# Patient Record
Sex: Female | Born: 1935 | Race: White | Hispanic: No | Marital: Married | Smoking: Former smoker
Health system: Southern US, Community
[De-identification: ages and names within clinical notes are randomized; demographics above are authoritative.]

## PROBLEM LIST (undated history)

## (undated) DIAGNOSIS — M1990 Unspecified osteoarthritis, unspecified site: Secondary | ICD-10-CM

## (undated) DIAGNOSIS — I10 Essential (primary) hypertension: Secondary | ICD-10-CM

## (undated) DIAGNOSIS — R51 Headache: Secondary | ICD-10-CM

## (undated) DIAGNOSIS — T4145XA Adverse effect of unspecified anesthetic, initial encounter: Secondary | ICD-10-CM

## (undated) DIAGNOSIS — R519 Headache, unspecified: Secondary | ICD-10-CM

## (undated) DIAGNOSIS — C801 Malignant (primary) neoplasm, unspecified: Secondary | ICD-10-CM

## (undated) DIAGNOSIS — I350 Nonrheumatic aortic (valve) stenosis: Secondary | ICD-10-CM

## (undated) DIAGNOSIS — H35039 Hypertensive retinopathy, unspecified eye: Secondary | ICD-10-CM

## (undated) DIAGNOSIS — T8859XA Other complications of anesthesia, initial encounter: Secondary | ICD-10-CM

## (undated) DIAGNOSIS — F32A Depression, unspecified: Secondary | ICD-10-CM

## (undated) DIAGNOSIS — I447 Left bundle-branch block, unspecified: Secondary | ICD-10-CM

## (undated) DIAGNOSIS — I499 Cardiac arrhythmia, unspecified: Secondary | ICD-10-CM

## (undated) HISTORY — DX: Hypertensive retinopathy, unspecified eye: H35.039

## (undated) HISTORY — DX: Essential (primary) hypertension: I10

## (undated) HISTORY — PX: CATARACT EXTRACTION: SUR2

## (undated) HISTORY — PX: APPENDECTOMY: SHX54

## (undated) HISTORY — DX: Nonrheumatic aortic (valve) stenosis: I35.0

## (undated) HISTORY — PX: EYE SURGERY: SHX253

## (undated) HISTORY — PX: UTERINE FIBROID SURGERY: SHX826

## (undated) HISTORY — PX: OTHER SURGICAL HISTORY: SHX169

## (undated) HISTORY — DX: Cardiac arrhythmia, unspecified: I49.9

---

## 2006-04-15 ENCOUNTER — Ambulatory Visit (HOSPITAL_COMMUNITY): Admission: RE | Admit: 2006-04-15 | Discharge: 2006-04-15 | Payer: Self-pay | Admitting: Family Medicine

## 2008-03-06 ENCOUNTER — Ambulatory Visit: Payer: Self-pay | Admitting: Cardiology

## 2008-03-10 ENCOUNTER — Ambulatory Visit: Payer: Self-pay | Admitting: Cardiology

## 2008-09-27 ENCOUNTER — Ambulatory Visit: Payer: Self-pay | Admitting: Cardiology

## 2008-09-27 DIAGNOSIS — R002 Palpitations: Secondary | ICD-10-CM | POA: Insufficient documentation

## 2008-09-27 DIAGNOSIS — I10 Essential (primary) hypertension: Secondary | ICD-10-CM | POA: Insufficient documentation

## 2010-07-09 NOTE — Assessment & Plan Note (Signed)
[facility]                          [facility] NOTE   CHARLOTTE, FIDALGO                     MRN:          [id number]  DATE:03/06/2008                            DOB:          18-Dec-1929    REFERRING PHYSICIAN:  Dr. Fara Chute   REASON FOR VISIT:  Palpitations.   HISTORY OF PRESENT ILLNESS:  Ms. Howald is a very pleasant 77 year old  woman with a history of hypertension and hypercholesterolemia.  She has  no reported problems with cardiovascular disease or dysrhythmia.  She  was visiting her son in [place], [place]  around Christmas and states that she woke up at night with a feeling of  fast heart beats and was unable to accurately count her pulse.  She  states her husband told her that her pulse felt thready and irregular.  She took a baby aspirin and sat up for a while.  It sounds as if her  symptoms eventually resolved over a period of several minutes to an  hour.  She has had no recurrent symptomatology and denies any long-term  history of intermittent palpitations.  Her electrocardiogram from  February 21, 2008 was reviewed and showed sinus bradycardia with overall  normal intervals.  She has had no problems with dizziness or syncope.  She does report that her blood pressure has been somewhat erratic and  she has cut back to low-dose Benicar HCTZ.  She has had systolics that  have dipped down into the high 90s at times.  She brings in a blood  pressure record with systolics as high as 170, although typically in the  140s.   ALLERGIES:  PENICILLIN and NONSTEROIDAL ANTI-INFLAMMATORY drugs.   PRESENT MEDICATIONS:  1. Pravastatin 10 mg p.o. daily.  2. Multivitamin daily.  3. She has been using Benicar HCT 20/12.5 mg one-half tablet, not on a      regular basis.  4. She also uses omeprazole 20 mg p.o. p.r.n.   PAST MEDICAL HISTORY:  As detailed above.   ADDITIONAL PROBLEMS:  Gastroesophageal  reflux disease, seasonal  allergies, and arthritis.  She is status post appendectomy, uterine  fibroid removal, and removal of a fibroadenoma from the left breast.   REVIEW OF SYSTEMS:  No cough, fevers, chills, orthopnea, PND, or  syncope.  She reports no exertional chest pain or limiting  breathlessness.  Appetite has been stable.  No lower extremity edema.  No melena or hematochezia.  Otherwise, negative.   SOCIAL HISTORY:  The patient is married.  She quit tobacco use in 1964.  She denies any significant alcohol use.   FAMILY HISTORY:  Significant for cardiovascular disease and  cerebrovascular disease in siblings and parents.   PHYSICAL EXAMINATION:  VITAL SIGNS:  Blood pressure was 191/87, heart  rate is 59 and regular, and weight is 171.6 pounds.  General:  Normally-nourished appearing woman in no acute distress.  HEENT:  Conjunctivae is normal.  Oropharynx is clear.  NECK:  Supple.  No elevated jugular venous pressure.  No audible bruits.  No thyromegaly is noted.  LUNGS:  Clear without  labored breathing at rest.  CARDIAC:  Regular rate and rhythm, very soft systolic murmur with  preserved second heart sound.  No pericardial rub or S3 gallop.  ABDOMEN:  Soft.  No bruits.  Bowel sounds are present.  EXTREMITIES:  No significant pitting edema.  Distal pulses are 2+.  SKIN:  Warm and dry.  MUSCULOSKELETAL:  No kyphosis is noted.  NEUROPSYCHIATRIC:  The patient is alert and oriented x3.  Affect is  appropriate.   IMPRESSION AND RECOMMENDATIONS:  1. Limited episode of palpitations as described above, without      associated dizziness or syncope, and without recurrence.  In a 41-      year-old woman with hypertension would suspect possible paroxysmal      atrial fibrillation, although this has not been proven.  Her      resting electrocardiogram is rather nonspecific at this time.  She      does have sinus bradycardia, although this does not appear to be      symptom  provoking.  She has a soft cardiac murmur on examination      that is likely benign.  We will plan a 2-D echocardiogram to assess      cardiac structure and function and at this point observe her      symptomatically.  Clearly if she manifests recurrent palpitations,      an event recorder would be in order.  I will otherwise plan to see      her back over the next 6 months.  At this point, a baby aspirin      daily should be a reasonable option.  2. Regarding her blood pressure management, one option might be to      consider placing her on a low dose of hydrochlorothiazide only and      follow blood pressures from there.  She seems to be fairly      sensitive to antihypertensive therapy.  Would avoid rate-lowering      medicines such as beta-blockers and calcium channel blockers.  Ms.      Crudup reports lab work this week per Dr. Neita Carp with a followup      with her in 1 week's time.     Jonelle Sidle, MD  Electronically Signed    SGM/MedQ  DD: 03/06/2008  DT: 03/07/2008  Job #: [id number]   cc:   Fara Chute

## 2010-07-09 NOTE — Assessment & Plan Note (Signed)
[facility]                          [facility]   Martha Richard, Martha Richard                     MRN:          [id number]  DATE:09/27/2008                            DOB:          [birth year]    PRIMARY CARE PHYSICIAN:  Dr. Fara Chute.   REASON FOR VISIT:  Followup history of palpitations.   HISTORY OF PRESENT ILLNESS:  I saw Martha Richard back in January.  Her  history is detailed in the previous note.  She reports doing very well  since that time with no recurrent palpitations.  We referred her for an  echocardiogram after that visit which revealed a normal left ventricular  ejection fraction of 60-65% with mild diastolic dysfunction, left atrial  enlargement, mild mitral regurgitation, and aortic valve  calcification/sclerosis, not significantly stenotic, and somewhat  asymmetrical in appearance, perhaps even bicuspid.  This would certainly  explain her soft cardiac murmur on examination, although I suspect is  not of clinical significance at this point.  She denies any new symptoms  of chest pain or breathlessness.  She continues to see Dr. Neita Carp on a 4-  month basis.   ALLERGIES:  PENICILLIN and NONSTEROIDAL ANTI-INFLAMMATORY DRUGS.   PRESENT MEDICATIONS:  1. Pravastatin 10 mg p.o. daily (has not taken recently).  2. Multivitamin 1 p.o. daily.  3. Benicar HCT 20/12.5 mg one-half tablet p.o. daily.  4. Omeprazole 20 mg p.o. p.r.n.   REVIEW OF SYSTEMS:  Outlined above.  No dizziness or syncope.  Otherwise  reviewed negative.   PHYSICAL EXAMINATION:  VITAL SIGNS:  Blood pressure is 98/60, heart rate  is 61 and regular, weight is 163 pounds.  GENERAL:  The patient is comfortable in no acute distress.  NECK:  No elevated jugular venous pressure.  No loud bruits.  No  thyromegaly.  LUNGS:  Clear without labored breathing at rest.  CARDIAC:  A regular rate and rhythm with a 2/6 systolic murmur heard at  the base, preserved second  heart sound.  No S3 gallop.  EXTREMITIES:  No  significant pitting edema.   IMPRESSION AND RECOMMENDATIONS:  History of limited palpitations without  obvious recurrence.  Echocardiography demonstrates normal left  ventricular systolic function and no major valvular abnormalities.  She  does have a calcified, functionally bicuspid aortic valve, although  without significant stenosis.  While explaining her cardiac murmur, I  doubt this is of other major clinical significance at this time.  Since  she has not manifested any further palpitations, I would recommend basic  observation at this time with further efforts at risk factor  modification under the direction of Dr. Neita Carp.  If she does manifest  new palpitations or other symptoms of dizziness or chest pain, we can  certainly see her back for further evaluation.     Jonelle Sidle, MD  Electronically Signed    SGM/MedQ  DD: 09/27/2008  DT: 09/28/2008  Job #: [id number]   cc:   Fara Chute

## 2011-09-11 ENCOUNTER — Encounter: Payer: Self-pay | Admitting: *Deleted

## 2011-09-11 DIAGNOSIS — I499 Cardiac arrhythmia, unspecified: Secondary | ICD-10-CM | POA: Insufficient documentation

## 2014-04-04 DIAGNOSIS — I10 Essential (primary) hypertension: Secondary | ICD-10-CM | POA: Diagnosis not present

## 2014-05-03 DIAGNOSIS — K210 Gastro-esophageal reflux disease with esophagitis: Secondary | ICD-10-CM | POA: Diagnosis not present

## 2014-05-03 DIAGNOSIS — E780 Pure hypercholesterolemia: Secondary | ICD-10-CM | POA: Diagnosis not present

## 2014-05-03 DIAGNOSIS — I10 Essential (primary) hypertension: Secondary | ICD-10-CM | POA: Diagnosis not present

## 2014-05-10 DIAGNOSIS — I10 Essential (primary) hypertension: Secondary | ICD-10-CM | POA: Diagnosis not present

## 2014-05-10 DIAGNOSIS — Z1389 Encounter for screening for other disorder: Secondary | ICD-10-CM | POA: Diagnosis not present

## 2014-05-10 DIAGNOSIS — E780 Pure hypercholesterolemia: Secondary | ICD-10-CM | POA: Diagnosis not present

## 2014-05-10 DIAGNOSIS — R7301 Impaired fasting glucose: Secondary | ICD-10-CM | POA: Diagnosis not present

## 2014-11-06 DIAGNOSIS — E780 Pure hypercholesterolemia: Secondary | ICD-10-CM | POA: Diagnosis not present

## 2014-11-06 DIAGNOSIS — K210 Gastro-esophageal reflux disease with esophagitis: Secondary | ICD-10-CM | POA: Diagnosis not present

## 2014-11-06 DIAGNOSIS — I10 Essential (primary) hypertension: Secondary | ICD-10-CM | POA: Diagnosis not present

## 2014-11-06 DIAGNOSIS — R7301 Impaired fasting glucose: Secondary | ICD-10-CM | POA: Diagnosis not present

## 2014-11-13 DIAGNOSIS — M1991 Primary osteoarthritis, unspecified site: Secondary | ICD-10-CM | POA: Diagnosis not present

## 2014-11-13 DIAGNOSIS — Z23 Encounter for immunization: Secondary | ICD-10-CM | POA: Diagnosis not present

## 2014-11-13 DIAGNOSIS — E780 Pure hypercholesterolemia: Secondary | ICD-10-CM | POA: Diagnosis not present

## 2014-11-13 DIAGNOSIS — I10 Essential (primary) hypertension: Secondary | ICD-10-CM | POA: Diagnosis not present

## 2014-11-13 DIAGNOSIS — I951 Orthostatic hypotension: Secondary | ICD-10-CM | POA: Diagnosis not present

## 2014-11-13 DIAGNOSIS — R7301 Impaired fasting glucose: Secondary | ICD-10-CM | POA: Diagnosis not present

## 2014-12-13 DIAGNOSIS — I10 Essential (primary) hypertension: Secondary | ICD-10-CM | POA: Diagnosis not present

## 2015-03-05 DIAGNOSIS — E7800 Pure hypercholesterolemia, unspecified: Secondary | ICD-10-CM | POA: Diagnosis not present

## 2015-03-05 DIAGNOSIS — K210 Gastro-esophageal reflux disease with esophagitis: Secondary | ICD-10-CM | POA: Diagnosis not present

## 2015-03-05 DIAGNOSIS — R7301 Impaired fasting glucose: Secondary | ICD-10-CM | POA: Diagnosis not present

## 2015-03-05 DIAGNOSIS — I10 Essential (primary) hypertension: Secondary | ICD-10-CM | POA: Diagnosis not present

## 2015-03-05 DIAGNOSIS — Z0000 Encounter for general adult medical examination without abnormal findings: Secondary | ICD-10-CM | POA: Diagnosis not present

## 2015-03-07 DIAGNOSIS — E7800 Pure hypercholesterolemia, unspecified: Secondary | ICD-10-CM | POA: Diagnosis not present

## 2015-03-15 DIAGNOSIS — I951 Orthostatic hypotension: Secondary | ICD-10-CM | POA: Diagnosis not present

## 2015-03-15 DIAGNOSIS — K210 Gastro-esophageal reflux disease with esophagitis: Secondary | ICD-10-CM | POA: Diagnosis not present

## 2015-03-15 DIAGNOSIS — M1612 Unilateral primary osteoarthritis, left hip: Secondary | ICD-10-CM | POA: Diagnosis not present

## 2015-03-15 DIAGNOSIS — I10 Essential (primary) hypertension: Secondary | ICD-10-CM | POA: Diagnosis not present

## 2015-03-15 DIAGNOSIS — R7301 Impaired fasting glucose: Secondary | ICD-10-CM | POA: Diagnosis not present

## 2015-05-14 DIAGNOSIS — B079 Viral wart, unspecified: Secondary | ICD-10-CM | POA: Diagnosis not present

## 2015-05-14 DIAGNOSIS — L820 Inflamed seborrheic keratosis: Secondary | ICD-10-CM | POA: Diagnosis not present

## 2015-05-22 DIAGNOSIS — M160 Bilateral primary osteoarthritis of hip: Secondary | ICD-10-CM | POA: Diagnosis not present

## 2015-05-22 DIAGNOSIS — M47816 Spondylosis without myelopathy or radiculopathy, lumbar region: Secondary | ICD-10-CM | POA: Diagnosis not present

## 2015-09-10 DIAGNOSIS — R7301 Impaired fasting glucose: Secondary | ICD-10-CM | POA: Diagnosis not present

## 2015-09-10 DIAGNOSIS — I10 Essential (primary) hypertension: Secondary | ICD-10-CM | POA: Diagnosis not present

## 2015-09-10 DIAGNOSIS — E7800 Pure hypercholesterolemia, unspecified: Secondary | ICD-10-CM | POA: Diagnosis not present

## 2015-09-10 DIAGNOSIS — K210 Gastro-esophageal reflux disease with esophagitis: Secondary | ICD-10-CM | POA: Diagnosis not present

## 2015-09-19 DIAGNOSIS — I951 Orthostatic hypotension: Secondary | ICD-10-CM | POA: Diagnosis not present

## 2015-09-19 DIAGNOSIS — I10 Essential (primary) hypertension: Secondary | ICD-10-CM | POA: Diagnosis not present

## 2015-09-19 DIAGNOSIS — R7301 Impaired fasting glucose: Secondary | ICD-10-CM | POA: Diagnosis not present

## 2015-09-19 DIAGNOSIS — K210 Gastro-esophageal reflux disease with esophagitis: Secondary | ICD-10-CM | POA: Diagnosis not present

## 2015-09-19 DIAGNOSIS — Z1389 Encounter for screening for other disorder: Secondary | ICD-10-CM | POA: Diagnosis not present

## 2015-09-19 DIAGNOSIS — Z13220 Encounter for screening for lipoid disorders: Secondary | ICD-10-CM | POA: Diagnosis not present

## 2015-10-25 DIAGNOSIS — K5900 Constipation, unspecified: Secondary | ICD-10-CM | POA: Diagnosis not present

## 2015-10-25 DIAGNOSIS — K5730 Diverticulosis of large intestine without perforation or abscess without bleeding: Secondary | ICD-10-CM | POA: Diagnosis not present

## 2015-10-25 DIAGNOSIS — Z1211 Encounter for screening for malignant neoplasm of colon: Secondary | ICD-10-CM | POA: Diagnosis not present

## 2015-10-25 DIAGNOSIS — Z86010 Personal history of colonic polyps: Secondary | ICD-10-CM | POA: Diagnosis not present

## 2015-11-27 DIAGNOSIS — X32XXXA Exposure to sunlight, initial encounter: Secondary | ICD-10-CM | POA: Diagnosis not present

## 2015-11-27 DIAGNOSIS — L570 Actinic keratosis: Secondary | ICD-10-CM | POA: Diagnosis not present

## 2015-11-27 DIAGNOSIS — L820 Inflamed seborrheic keratosis: Secondary | ICD-10-CM | POA: Diagnosis not present

## 2016-03-10 DIAGNOSIS — K210 Gastro-esophageal reflux disease with esophagitis: Secondary | ICD-10-CM | POA: Diagnosis not present

## 2016-03-10 DIAGNOSIS — I10 Essential (primary) hypertension: Secondary | ICD-10-CM | POA: Diagnosis not present

## 2016-03-10 DIAGNOSIS — E7800 Pure hypercholesterolemia, unspecified: Secondary | ICD-10-CM | POA: Diagnosis not present

## 2016-03-10 DIAGNOSIS — R7301 Impaired fasting glucose: Secondary | ICD-10-CM | POA: Diagnosis not present

## 2016-03-18 DIAGNOSIS — R7301 Impaired fasting glucose: Secondary | ICD-10-CM | POA: Diagnosis not present

## 2016-03-18 DIAGNOSIS — I951 Orthostatic hypotension: Secondary | ICD-10-CM | POA: Diagnosis not present

## 2016-03-18 DIAGNOSIS — E7800 Pure hypercholesterolemia, unspecified: Secondary | ICD-10-CM | POA: Diagnosis not present

## 2016-03-18 DIAGNOSIS — K210 Gastro-esophageal reflux disease with esophagitis: Secondary | ICD-10-CM | POA: Diagnosis not present

## 2016-03-18 DIAGNOSIS — I10 Essential (primary) hypertension: Secondary | ICD-10-CM | POA: Diagnosis not present

## 2016-03-27 DIAGNOSIS — M1612 Unilateral primary osteoarthritis, left hip: Secondary | ICD-10-CM | POA: Diagnosis not present

## 2016-03-27 DIAGNOSIS — M25552 Pain in left hip: Secondary | ICD-10-CM | POA: Diagnosis not present

## 2016-04-10 DIAGNOSIS — M25552 Pain in left hip: Secondary | ICD-10-CM | POA: Diagnosis not present

## 2016-04-25 DIAGNOSIS — M1612 Unilateral primary osteoarthritis, left hip: Secondary | ICD-10-CM | POA: Diagnosis not present

## 2016-04-29 ENCOUNTER — Ambulatory Visit: Payer: Self-pay | Admitting: Orthopedic Surgery

## 2016-04-29 DIAGNOSIS — M1612 Unilateral primary osteoarthritis, left hip: Secondary | ICD-10-CM | POA: Diagnosis not present

## 2016-04-29 DIAGNOSIS — I10 Essential (primary) hypertension: Secondary | ICD-10-CM | POA: Diagnosis not present

## 2016-04-29 DIAGNOSIS — E7800 Pure hypercholesterolemia, unspecified: Secondary | ICD-10-CM | POA: Diagnosis not present

## 2016-05-14 NOTE — Patient Instructions (Signed)
Martha Richard  05/14/2016   Your procedure is scheduled on: 05/21/16  Report to [facility] Main  Entrance    Follow map to first floor short stay at      0515AM.  Call this number if you have problems the morning of surgery [phone]   Remember: ONLY 1 PERSON MAY GO WITH YOU TO SHORT STAY TO GET  READY MORNING OF [facility].  Do not eat food or drink liquids :After Midnight.     Take these medicines the morning of surgery with A SIP OF WATER: NONE                               You may not have any metal on your body including hair pins and              piercings  Do not wear jewelry, make-up, lotions, powders or perfumes, deodorant             Do not wear nail polish.  Do not shave  48 hours prior to surgery.               Do not bring valuables to the hospital. [place].  Contacts, dentures or bridgework may not be worn into surgery.  Leave suitcase in the car. After surgery it may be brought to your room.                Please read over the following fact sheets you were given: _____________________________________________________________________             [facility] - Preparing for Surgery Before surgery, you can play an important role.  Because skin is not sterile, your skin needs to be as free of germs as possible.  You can reduce the number of germs on your skin by washing with CHG (chlorahexidine gluconate) soap before surgery.  CHG is an antiseptic cleaner which kills germs and bonds with the skin to continue killing germs even after washing. Please DO NOT use if you have an allergy to CHG or antibacterial soaps.  If your skin becomes reddened/irritated stop using the CHG and inform your nurse when you arrive at Short Stay. Do not shave (including legs and underarms) for at least 48 hours prior to the first CHG shower.  You may shave your face/neck. Please follow these instructions  carefully:  1.  Shower with CHG Soap the night before surgery and the  morning of Surgery.  2.  If you choose to wash your hair, wash your hair first as usual with your  normal  shampoo.  3.  After you shampoo, rinse your hair and body thoroughly to remove the  shampoo.                           4.  Use CHG as you would any other liquid soap.  You can apply chg directly  to the skin and wash                       Gently with a scrungie or clean washcloth.  5.  Apply the CHG Soap to your body ONLY FROM THE NECK DOWN.  Do not use on face/ open                           Wound or open sores. Avoid contact with eyes, ears mouth and genitals (private parts).                       Wash face,  Genitals (private parts) with your normal soap.             6.  Wash thoroughly, paying special attention to the area where your surgery  will be performed.  7.  Thoroughly rinse your body with warm water from the neck down.  8.  DO NOT shower/wash with your normal soap after using and rinsing off  the CHG Soap.                9.  Pat yourself dry with a clean towel.            10.  Wear clean pajamas.            11.  Place clean sheets on your bed the night of your first shower and do not  sleep with pets. Day of Surgery : Do not apply any lotions/deodorants the morning of surgery.  Please wear clean clothes to the hospital/surgery center.  FAILURE TO FOLLOW THESE INSTRUCTIONS MAY RESULT IN THE CANCELLATION OF YOUR SURGERY PATIENT SIGNATURE_________________________________  NURSE SIGNATURE__________________________________  ________________________________________________________________________  WHAT IS A BLOOD TRANSFUSION? Blood Transfusion Information  A transfusion is the replacement of blood or some of its parts. Blood is made up of multiple cells which provide different functions.  Red blood cells carry oxygen and are used for blood loss replacement.  White blood cells fight against  infection.  Platelets control bleeding.  Plasma helps clot blood.  Other blood products are available for specialized needs, such as hemophilia or other clotting disorders. BEFORE THE TRANSFUSION  Who gives blood for transfusions?   Healthy volunteers who are fully evaluated to make sure their blood is safe. This is blood bank blood. Transfusion therapy is the safest it has ever been in the practice of medicine. Before blood is taken from a donor, a complete history is taken to make sure that person has no history of diseases nor engages in risky social behavior (examples are intravenous drug use or sexual activity with multiple partners). The donor's travel history is screened to minimize risk of transmitting infections, such as malaria. The donated blood is tested for signs of infectious diseases, such as HIV and hepatitis. The blood is then tested to be sure it is compatible with you in order to minimize the chance of a transfusion reaction. If you or a relative donates blood, this is often done in anticipation of surgery and is not appropriate for emergency situations. It takes many days to process the donated blood. RISKS AND COMPLICATIONS Although transfusion therapy is very safe and saves many lives, the main dangers of transfusion include:   Getting an infectious disease.  Developing a transfusion reaction. This is an allergic reaction to something in the blood you were given. Every precaution is taken to prevent this. The decision to have a blood transfusion has been considered carefully by your caregiver before blood is given. Blood is not given unless the benefits outweigh the risks. AFTER THE TRANSFUSION  Right after receiving a blood transfusion, you will usually feel much better and more energetic. This is especially  true if your red blood cells have gotten low (anemic). The transfusion raises the level of the red blood cells which carry oxygen, and this usually causes an energy  increase.  The nurse administering the transfusion will monitor you carefully for complications. HOME CARE INSTRUCTIONS  No special instructions are needed after a transfusion. You may find your energy is better. Speak with your caregiver about any limitations on activity for underlying diseases you may have. SEEK MEDICAL CARE IF:   Your condition is not improving after your transfusion.  You develop redness or irritation at the intravenous (IV) site. SEEK IMMEDIATE MEDICAL CARE IF:  Any of the following symptoms occur over the next 12 hours:  Shaking chills.  You have a temperature by mouth above 102 F (38.9 C), not controlled by medicine.  Chest, back, or muscle pain.  People around you feel you are not acting correctly or are confused.  Shortness of breath or difficulty breathing.  Dizziness and fainting.  You get a rash or develop hives.  You have a decrease in urine output.  Your urine turns a dark color or changes to pink, red, or brown. Any of the following symptoms occur over the next 10 days:  You have a temperature by mouth above 102 F (38.9 C), not controlled by medicine.  Shortness of breath.  Weakness after normal activity.  The white part of the eye turns yellow (jaundice).  You have a decrease in the amount of urine or are urinating less often.  Your urine turns a dark color or changes to pink, red, or brown. Document Released: 02/08/2000 Document Revised: 05/05/2011 Document Reviewed: 09/27/2007 Exit[employer] Patient Information 2014 [place].  _______________________________________________________________________  Incentive Spirometer  An incentive spirometer is a tool that can help keep your lungs clear and active. This tool measures how well you are filling your lungs with each breath. Taking long deep breaths may help reverse or decrease the chance of developing breathing (pulmonary) problems (especially infection) following:  A long  period of time when you are unable to move or be active. BEFORE THE PROCEDURE   If the spirometer includes an indicator to show your best effort, your nurse or respiratory therapist will set it to a desired goal.  If possible, sit up straight or lean slightly forward. Try not to slouch.  Hold the incentive spirometer in an upright position. INSTRUCTIONS FOR USE  1. Sit on the edge of your bed if possible, or sit up as far as you can in bed or on a chair. 2. Hold the incentive spirometer in an upright position. 3. Breathe out normally. 4. Place the mouthpiece in your mouth and seal your lips tightly around it. 5. Breathe in slowly and as deeply as possible, raising the piston or the ball toward the top of the column. 6. Hold your breath for 3-5 seconds or for as long as possible. Allow the piston or ball to fall to the bottom of the column. 7. Remove the mouthpiece from your mouth and breathe out normally. 8. Rest for a few seconds and repeat Steps 1 through 7 at least 10 times every 1-2 hours when you are awake. Take your time and take a few normal breaths between deep breaths. 9. The spirometer may include an indicator to show your best effort. Use the indicator as a goal to work toward during each repetition. 10. After each set of 10 deep breaths, practice coughing to be sure your lungs are clear. If you have  an incision (the cut made at the time of surgery), support your incision when coughing by placing a pillow or rolled up towels firmly against it. Once you are able to get out of bed, walk around indoors and cough well. You may stop using the incentive spirometer when instructed by your caregiver.  RISKS AND COMPLICATIONS  Take your time so you do not get dizzy or light-headed.  If you are in pain, you may need to take or ask for pain medication before doing incentive spirometry. It is harder to take a deep breath if you are having pain. AFTER USE  Rest and breathe slowly and  easily.  It can be helpful to keep track of a log of your progress. Your caregiver can provide you with a simple table to help with this. If you are using the spirometer at home, follow these instructions: [place] IF:   You are having difficultly using the spirometer.  You have trouble using the spirometer as often as instructed.  Your pain medication is not giving enough relief while using the spirometer.  You develop fever of 100.5 F (38.1 C) or higher. SEEK IMMEDIATE MEDICAL CARE IF:   You cough up bloody sputum that had not been present before.  You develop fever of 102 F (38.9 C) or greater.  You develop worsening pain at or near the incision site. MAKE SURE YOU:   Understand these instructions.  Will watch your condition.  Will get help right away if you are not doing well or get worse. Document Released: 06/23/2006 Document Revised: 05/05/2011 Document Reviewed: 08/24/2006 [facility] Patient Information 2014 [place], [place].   ________________________________________________________________________

## 2016-05-15 ENCOUNTER — Encounter (HOSPITAL_COMMUNITY)
Admission: RE | Admit: 2016-05-15 | Discharge: 2016-05-15 | Disposition: A | Discharge status: Home / Self Care | Payer: Medicare Other | Source: Ambulatory Visit | Attending: Orthopedic Surgery | Admitting: Orthopedic Surgery

## 2016-05-15 ENCOUNTER — Encounter (HOSPITAL_COMMUNITY): Payer: Self-pay

## 2016-05-15 DIAGNOSIS — R001 Bradycardia, unspecified: Secondary | ICD-10-CM | POA: Insufficient documentation

## 2016-05-15 DIAGNOSIS — Z0183 Encounter for blood typing: Secondary | ICD-10-CM | POA: Insufficient documentation

## 2016-05-15 DIAGNOSIS — Z01818 Encounter for other preprocedural examination: Secondary | ICD-10-CM | POA: Insufficient documentation

## 2016-05-15 DIAGNOSIS — M1612 Unilateral primary osteoarthritis, left hip: Secondary | ICD-10-CM | POA: Diagnosis not present

## 2016-05-15 DIAGNOSIS — R9431 Abnormal electrocardiogram [ECG] [EKG]: Secondary | ICD-10-CM | POA: Insufficient documentation

## 2016-05-15 DIAGNOSIS — Z01812 Encounter for preprocedural laboratory examination: Secondary | ICD-10-CM | POA: Insufficient documentation

## 2016-05-15 DIAGNOSIS — I447 Left bundle-branch block, unspecified: Secondary | ICD-10-CM | POA: Insufficient documentation

## 2016-05-15 HISTORY — DX: Headache, unspecified: R51.9

## 2016-05-15 HISTORY — DX: Malignant (primary) neoplasm, unspecified: C80.1

## 2016-05-15 HISTORY — DX: Adverse effect of unspecified anesthetic, initial encounter: T41.45XA

## 2016-05-15 HISTORY — DX: Unspecified osteoarthritis, unspecified site: M19.90

## 2016-05-15 HISTORY — DX: Headache: R51

## 2016-05-15 HISTORY — DX: Other complications of anesthesia, initial encounter: T88.59XA

## 2016-05-15 LAB — CBC
HCT: 41.8 % (ref 36.0–46.0)
Hemoglobin: 13.8 g/dL (ref 12.0–15.0)
MCH: 29.4 pg (ref 26.0–34.0)
MCHC: 33 g/dL (ref 30.0–36.0)
MCV: 88.9 fL (ref 78.0–100.0)
Platelets: 306 10*3/uL (ref 150–400)
RBC: 4.7 MIL/uL (ref 3.87–5.11)
RDW: 13.7 % (ref 11.5–15.5)
WBC: 9.2 10*3/uL (ref 4.0–10.5)

## 2016-05-15 LAB — COMPREHENSIVE METABOLIC PANEL
ALT: 13 U/L — ABNORMAL LOW (ref 14–54)
AST: 19 U/L (ref 15–41)
Albumin: 3.8 g/dL (ref 3.5–5.0)
Alkaline Phosphatase: 74 U/L (ref 38–126)
Anion gap: 7 (ref 5–15)
BUN: 18 mg/dL (ref 6–20)
CO2: 28 mmol/L (ref 22–32)
Calcium: 9.1 mg/dL (ref 8.9–10.3)
Chloride: 103 mmol/L (ref 101–111)
Creatinine, Ser: 0.73 mg/dL (ref 0.44–1.00)
GFR calc Af Amer: >60 mL/min (ref >60)
GFR calc non Af Amer: >60 mL/min (ref >60)
Glucose, Bld: 110 mg/dL — ABNORMAL HIGH (ref 65–99)
Potassium: 4.2 mmol/L (ref 3.5–5.1)
Sodium: 138 mmol/L (ref 135–145)
Total Bilirubin: 0.7 mg/dL (ref 0.3–1.2)
Total Protein: 6.6 g/dL (ref 6.5–8.1)

## 2016-05-15 LAB — SURGICAL PCR SCREEN
MRSA, PCR: NEGATIVE
Staphylococcus aureus: NEGATIVE

## 2016-05-15 LAB — ABO/RH: ABO/RH(D): O POS

## 2016-05-15 LAB — PROTIME-INR
INR: 0.98
Prothrombin Time: 13 seconds (ref 11.4–15.2)

## 2016-05-15 LAB — APTT: aPTT: 28 seconds (ref 24–36)

## 2016-05-15 NOTE — Progress Notes (Addendum)
Pt. BP at preop was 189/69 L arm and 186/82 r arm pt. Was asymptomatic . Denies CP dizziness sob. EKG showed a LBBB . Showed  Dr. Barrett Shell anesthesia  EKG and BP results. she stated that if this was a new finding pt. Would require Card clearance. Spoke with Denton Ar RN at Dr. Franchot Gallo office in [place] # [phone] . She said there was no comparison in there computer charting that they have had since 2012. She stated she would check the paper chart and fax to me if she had one to compare to. Also informed Denton Ar, RN  that pt. BP was elevated (the above reading) and that pt. Stated she only takes her BP med when her head hurts or she eats too much salt. Also informed pt. At preop appt to follow up with her PCP about her BP. Pt. VU

## 2016-05-15 NOTE — Progress Notes (Signed)
LVM with  Fabio Asa that there was no EKG to compare that they could find at Dr. Franchot Gallo office sine they have been on computer 2012. Therefore orders from Dr. Marcie Bal  Pt. Will require a cardiac clearance.

## 2016-05-15 NOTE — Progress Notes (Signed)
04/29/16 Dr. Caprice Kluver clearance on chart

## 2016-05-16 ENCOUNTER — Ambulatory Visit: Payer: Self-pay | Admitting: Orthopedic Surgery

## 2016-05-16 NOTE — H&P (Signed)
Martha Richard DOB: April 26, 1929 Married / Language: English / Race: White Female Date of Admission:  05/21/2016 CC:  Left Hip Pain History of Present Illness The patient is a 83 year old female who comes in for a preoperative History and Physical. The patient is scheduled for a left total hip arthroplasty (anterior) to be performed by Dr. Dione Plover. Aluisio, MD at [facility] on 05-21-2016. The patient is a 83 year old female who presented with a hip problem. The patient reports left hip problems including pain symptoms that have been present for over 6 month(s). The symptoms began without any known injury. Symptoms reported include hip pain The patient reports symptoms radiating to the: left groin and left thigh. The patient describes the hip problem as sharp, dull and aching. Onset of symptoms was with symptoms now occuring constantly (worse with walking).The patient feels as if their symptoms are does feel they are worsening. She has had pain in the left anterior groin for over six months. It came on without trauma. She has had some treatment for this hip three or four years ago where she had an injection into her hip, but it was not done under x-ray or ultrasound guidance and she states it did not help at all. Here recently, she has been having more discomfort in the left groin. It radiated into the anterior left thigh. It is worse with weightbearing and better when she is off it. X-rays show advanced osteoarthritis in the left hip with bone on bone superior weightbearing dome finding, cystic degenerative change in both the acetabular and up femoral head side. She is 87, but overall in good health. She is going to become severely debilitated if we do not get this fixed. At this point, the most predictive means of improving her pain and function is going be total hip arthroplasty. We discussed in detail and she wants to proceed. They have been treated conservatively in the past for the above  stated problem and despite conservative measures, they continue to have progressive pain and severe functional limitations and dysfunction. They have failed non-operative management including home exercise, medications, and injections. It is felt that they would benefit from undergoing total joint replacement. Risks and benefits of the procedure have been discussed with the patient and they elect to proceed with surgery. There are no active contraindications to surgery such as ongoing infection or rapidly progressive neurological disease.  Problem List/Past Medical Pain of left hip joint (M25.552)  Primary osteoarthritis of left hip (M16.12)  Impaired fasting glucose (R73.01)  High blood pressure  Skin Cancer  Shingles  Cataract  Hypercholesterolemia  Hemorrhoids  Bursitis  Measles  Mumps  Orthostatic Hypotension  Gastroesophageal Reflux Disease  Allergies Peanut (Diagnostic) *DIAGNOSTIC PRODUCTS*  Aspir-81 *ANALGESICS - NonNarcotic*  gastric burning and bleeding Penicillins  Rash, Itching. ACE Inhibitors  Cough. Atenolol *BETA BLOCKERS*  Bradycardia  Family History Cancer  Father. Cerebrovascular Accident  Father. Diabetes Mellitus  Brother, Maternal Grandfather, Maternal Grandmother. First Degree Relatives  reported Heart Disease  Mother. Heart disease in female family member before age 77  Hypertension  Mother. Osteoarthritis  Mother.  Social History Children  2 Current work status  retired Furniture conservator/restorer rarely Living situation  live with spouse Marital status  married Never consumed alcohol  03/27/2016: Never consumed alcohol No history of drug/alcohol rehab  Not under pain contract  Number of flights of stairs before winded  less than 1 Tobacco / smoke exposure  03/27/2016: no Tobacco  use  Former smoker. 03/27/2016: smoke(d) less than 1/2 pack(s) per day Advance Directives  Living Will  Medication History Chlorthalidone  (25MG  Tablet, Oral) Active.  Past Surgical History Breast Biopsy  left Breast Mass; Local Excision  left   Review of Systems  General Not Present- Chills, Fatigue, Fever, Memory Loss, Night Sweats, Weight Gain and Weight Loss. Skin Not Present- Eczema, Hives, Itching, Lesions and Rash. HEENT Not Present- Dentures, Double Vision, Headache, Hearing Loss, Tinnitus and Visual Loss. Respiratory Present- Shortness of breath with exertion. Not Present- Allergies, Chronic Cough, Coughing up blood and Shortness of breath at rest. Cardiovascular Not Present- Chest Pain, Difficulty Breathing Lying Down, Murmur, Palpitations, Racing/skipping heartbeats and Swelling. Gastrointestinal Not Present- Abdominal Pain, Bloody Stool, Constipation, Diarrhea, Difficulty Swallowing, Heartburn, Jaundice, Loss of appetitie, Nausea and Vomiting. Female Genitourinary Not Present- Blood in Urine, Discharge, Flank Pain, Incontinence, Painful Urination, Urgency, Urinary frequency, Urinary Retention, Urinating at Night and Weak urinary stream. Musculoskeletal Present- Joint Pain. Not Present- Back Pain, Joint Swelling, Morning Stiffness, Muscle Pain, Muscle Weakness and Spasms. Neurological Not Present- Blackout spells, Difficulty with balance, Dizziness, Paralysis, Tremor and Weakness. Psychiatric Not Present- Insomnia.  Vitals Weight: 162 lb Height: 63in Weight was reported by patient. Height was reported by patient. Body Surface Area: 1.77 m Body Mass Index: 28.7 kg/m  Pulse: 56 (Regular)  BP: 148/86 (Sitting, Left Arm, Standard)  Physical Exam  General Mental Status -Alert, cooperative and good historian. General Appearance-pleasant, Not in acute distress. Orientation-Oriented X3. Build & Nutrition-Well nourished and Well developed.  Head and Neck Head-normocephalic, atraumatic . Neck Global Assessment - supple, no bruit auscultated on the right, no bruit auscultated on the  left.  Eye Pupil - Bilateral-Regular and Round. Motion - Bilateral-EOMI.  Chest and Lung Exam Auscultation Breath sounds - clear at anterior chest wall and clear at posterior chest wall. Adventitious sounds - No Adventitious sounds.  Cardiovascular Auscultation Rhythm - Regular rate and rhythm. Heart Sounds - S1 WNL and S2 WNL. Murmurs & Other Heart Sounds: Murmur 1 - Location - Aortic Area and Sternal Border - Left. Timing - Mid-systolic. Grade - III/VI. Character - Low pitched.  Abdomen Palpation/Percussion Tenderness - Abdomen is non-tender to palpation. Rigidity (guarding) - Abdomen is soft. Auscultation Auscultation of the abdomen reveals - Bowel sounds normal.  Female Genitourinary Note: Not done, not pertinent to present illness   Musculoskeletal Note: She is in no distress. Her left hip can be flexed to 90. No internal rotation, about 10 to 20 of external rotation, 20 of abduction. Right hip range of motion is normal.  Assessment & Plan Primary osteoarthritis of left hip (M16.12)  Note:Surgical Plans: Left Total Hip Replacement - Anterior Approach  Disposition: Home with husband  PCP: Dr. Quintin Alto - Patient has been seen preoperatively and felt to be stable for surgery. Cards: Dr. Lindon Romp - Pending at time of H&P. Patient was found to have some changes on her preop EKG at the hospital. Cardiology appt setup and is pending at time of office H&P.  IV TXA  Anesthesia Issues: Very sensitive to the medications  Patient was instructed on what medications to stop prior to surgery.  Signed electronically by Joelene Millin, III PA-C

## 2016-05-19 ENCOUNTER — Encounter: Payer: Self-pay | Admitting: Cardiovascular Disease

## 2016-05-19 ENCOUNTER — Telehealth: Payer: Self-pay | Admitting: Cardiovascular Disease

## 2016-05-19 ENCOUNTER — Encounter: Payer: Self-pay | Admitting: *Deleted

## 2016-05-19 ENCOUNTER — Ambulatory Visit (INDEPENDENT_AMBULATORY_CARE_PROVIDER_SITE_OTHER): Payer: Medicare Other | Admitting: Cardiovascular Disease

## 2016-05-19 VITALS — BP 145/70 | HR 53 | Ht 63.0 in | Wt 161.4 lb

## 2016-05-19 DIAGNOSIS — R011 Cardiac murmur, unspecified: Secondary | ICD-10-CM | POA: Diagnosis not present

## 2016-05-19 DIAGNOSIS — R0609 Other forms of dyspnea: Secondary | ICD-10-CM | POA: Diagnosis not present

## 2016-05-19 DIAGNOSIS — Z01818 Encounter for other preprocedural examination: Secondary | ICD-10-CM | POA: Diagnosis not present

## 2016-05-19 DIAGNOSIS — I447 Left bundle-branch block, unspecified: Secondary | ICD-10-CM | POA: Diagnosis not present

## 2016-05-19 DIAGNOSIS — R0600 Dyspnea, unspecified: Secondary | ICD-10-CM

## 2016-05-19 NOTE — Telephone Encounter (Signed)
Lexiscan & Echo schedule March 30 at [facility]

## 2016-05-19 NOTE — Patient Instructions (Signed)
Your physician recommends that you schedule a follow-up appointment in: 1 MONTH WITH DR KONESWARAN  Your physician recommends that you continue on your current medications as directed. Please refer to the Current Medication list given to you today.  Your physician has requested that you have an echocardiogram. Echocardiography is a painless test that uses sound waves to create images of your heart. It provides your doctor with information about the size and shape of your heart and how well your heart's chambers and valves are working. This procedure takes approximately one hour. There are no restrictions for this procedure.  Your physician has requested that you have a lexiscan myoview. For further information please visit [email or web address]. Please follow instruction sheet, as given.  Thank you for choosing [facility]!!    

## 2016-05-19 NOTE — Progress Notes (Signed)
CARDIOLOGY CONSULT NOTE  Patient ID: CHRISA HASSAN MRN: [id number] DOB/AGE: August 07, 1929 83 y.o.  Admit date: (Not on file) Primary Physician: Manon Hilding, MD Referring Physician:   Reason for Consultation: preop risk stratification  HPI: The patient is an 83 year old woman who is scheduled to undergo left total hip replacement later this month. She has a history of hypertension and was found to have an abnormal ECG and is thus referred today.  ECG 05/15/16: Sinus bradycardia, 57 bpm, left bundle branch block.  She denies exertional chest pain but does complain of exertional dyspnea. She said "I tire very easily ". She said she sits around a lot. She has been walking with a cane for the past 3 or 4 months. She is unable to exercise. She denies palpitations, orthopnea, lower extremity edema, and paroxysmal nocturnal dyspnea.    Allergies  Allergen Reactions  . Ace Inhibitors Cough  . Nsaids Other (See Comments)    GI upset    . Peanut-Containing Drug Products Other (See Comments)    headaches  . Ultram [Tramadol Hcl] Other (See Comments)    headaches  . Penicillins Itching and Rash    Has patient had a PCN reaction causing immediate rash, facial/tongue/throat swelling, SOB or lightheadedness with hypotension: No Has patient had a PCN reaction causing severe rash involving mucus membranes or skin necrosis: No Has patient had a PCN reaction that required hospitalization No Has patient had a PCN reaction occurring within the last 10 years: No If all of the above answers are "NO", then may proceed with Cephalosporin use.     Current Outpatient Prescriptions  Medication Sig Dispense Refill  . chlorthalidone (HYGROTON) 25 MG tablet Take 6.25 mg by mouth daily. Takes 1/4 tablet only as needed for high blood pressure    . trolamine salicylate (ASPERCREME) 10 % cream Apply 1 application topically daily as needed for muscle pain.     No current facility-administered  medications for this visit.     Past Medical History:  Diagnosis Date  . Arrhythmia    palpitations  . Arthritis   . Cancer (HCC)    squamous cell Left hand  . Complication of anesthesia    slow to wake up  . Headache    hx of migraines  . Hypertension     Past Surgical History:  Procedure Laterality Date  . APPENDECTOMY    . fibroadenoma left breast removal    . OTHER SURGICAL HISTORY     appendectomy  . UTERINE FIBROID SURGERY      Social History   Social History  . Marital status: Married    Spouse name: N/A  . Number of children: N/A  . Years of education: N/A   Occupational History  . Not on file.   Social History Main Topics  . Smoking status: Former Research scientist (life sciences)  . Smokeless tobacco: Never Used     Comment: years ago 50 plus years  . Alcohol use No  . Drug use: Unknown  . Sexual activity: Not Currently   Other Topics Concern  . Not on file   Social History Narrative  . No narrative on file     No family history of premature CAD in 1st degree relatives.  Prior to Admission medications   Medication Sig Start Date End Date Taking? Authorizing Provider  chlorthalidone (HYGROTON) 25 MG tablet Take 6.25 mg by mouth daily. Takes 1/4 tablet only as needed for high blood pressure   Yes  Historical Provider, MD  trolamine salicylate (ASPERCREME) 10 % cream Apply 1 application topically daily as needed for muscle pain.   Yes Historical Provider, MD     Review of systems complete and found to be negative unless listed above in HPI     Physical exam Blood pressure (!) 145/70, pulse (!) 53, height 5\' 3"  (1.6 m), weight 161 lb 6.4 oz (73.2 kg), SpO2 98 %. General: NAD Neck: No JVD, no thyromegaly or thyroid nodule.  Lungs: Clear to auscultation bilaterally with normal respiratory effort. CV: Nondisplaced PMI. Regular rate and rhythm, normal S1/S2, no S3/S4, 2/6 ejection systolic murmur loudest over RUSB.  No peripheral edema.  No carotid bruit.    Abdomen:  Soft, nontender, no hepatosplenomegaly, no distention.  Skin: Intact without lesions or rashes.  Neurologic: Alert and oriented x 3.  Psych: Normal affect. Extremities: No clubbing or cyanosis.  HEENT: Normal.   ECG: Most recent ECG reviewed.  Telemetry: Independently reviewed.  Labs:   Lab Results  Component Value Date   WBC 9.2 05/15/2016   HGB 13.8 05/15/2016   HCT 41.8 05/15/2016   MCV 88.9 05/15/2016   PLT 306 05/15/2016    Recent Labs Lab 05/15/16 1425  NA 138  K 4.2  CL 103  CO2 28  BUN 18  CREATININE 0.73  CALCIUM 9.1  PROT 6.6  BILITOT 0.7  ALKPHOS 74  ALT 13*  AST 19  GLUCOSE 110*   No results found for: CKTOTAL, CKMB, CKMBINDEX, TROPONINI No results found for: CHOL No results found for: HDL No results found for: LDLCALC No results found for: TRIG No results found for: CHOLHDL No results found for: LDLDIRECT       Studies: No results found.  ASSESSMENT AND PLAN:  1. Preoperative risk stratification with exertional dyspnea and LBBB: Unable to ascertain functional status due to left hip osteoarthritis. She has an underlying left bundle-branch block and has symptoms of exertional dyspnea and fatigue. She also has an aortic systolic murmur. I will proceed with nuclear stress testing to more accurately assess perioperative risk. I will order a 2-D echocardiogram with Doppler to evaluate cardiac structure, function, and regional wall motion.  2. Left bundle branch block with exertional dyspnea: There is certainly a higher risk of having ischemic heart disease with an underlying left bundle branch block. I will proceed with a nuclear myocardial perfusion imaging study to evaluate for ischemic heart disease (Lexiscan Myoview).  3. Hypertension: Mildly elevated. Needs further monitoring.  4. Murmur: This may represent either aortic valve sclerosis or at least a mild degree of aortic valve stenosis. I will obtain an echocardiogram to further  assess.   Dispo: fu 1 month.   Signed: Kate Sable, M.D., F.A.C.C.  05/19/2016, 8:47 AM

## 2016-05-21 ENCOUNTER — Inpatient Hospital Stay (HOSPITAL_COMMUNITY): Admission: RE | Admit: 2016-05-21 | Payer: Medicare Other | Source: Ambulatory Visit | Admitting: Orthopedic Surgery

## 2016-05-21 ENCOUNTER — Encounter (HOSPITAL_COMMUNITY): Admission: RE | Payer: Self-pay | Source: Ambulatory Visit

## 2016-05-21 LAB — TYPE AND SCREEN
ABO/RH(D): O POS
Antibody Screen: NEGATIVE

## 2016-05-21 SURGERY — ARTHROPLASTY, HIP, TOTAL, ANTERIOR APPROACH
Anesthesia: Choice | Site: Hip | Laterality: Left

## 2016-05-23 ENCOUNTER — Inpatient Hospital Stay (HOSPITAL_COMMUNITY): Admission: RE | Admit: 2016-05-23 | Payer: Medicare Other | Source: Ambulatory Visit

## 2016-05-23 ENCOUNTER — Encounter (HOSPITAL_BASED_OUTPATIENT_CLINIC_OR_DEPARTMENT_OTHER)
Admission: RE | Admit: 2016-05-23 | Discharge: 2016-05-23 | Disposition: A | Discharge status: Home / Self Care | Payer: Medicare Other | Source: Ambulatory Visit | Attending: Cardiovascular Disease | Admitting: Cardiovascular Disease

## 2016-05-23 ENCOUNTER — Encounter (HOSPITAL_COMMUNITY): Payer: Self-pay

## 2016-05-23 ENCOUNTER — Ambulatory Visit (HOSPITAL_COMMUNITY)
Admission: RE | Admit: 2016-05-23 | Discharge: 2016-05-23 | Disposition: A | Discharge status: Home / Self Care | Payer: Medicare Other | Source: Ambulatory Visit | Attending: Cardiovascular Disease | Admitting: Cardiovascular Disease

## 2016-05-23 DIAGNOSIS — I313 Pericardial effusion (noninflammatory): Secondary | ICD-10-CM | POA: Diagnosis not present

## 2016-05-23 DIAGNOSIS — I351 Nonrheumatic aortic (valve) insufficiency: Secondary | ICD-10-CM | POA: Diagnosis not present

## 2016-05-23 DIAGNOSIS — R011 Cardiac murmur, unspecified: Secondary | ICD-10-CM

## 2016-05-23 DIAGNOSIS — I10 Essential (primary) hypertension: Secondary | ICD-10-CM | POA: Insufficient documentation

## 2016-05-23 DIAGNOSIS — I447 Left bundle-branch block, unspecified: Secondary | ICD-10-CM | POA: Diagnosis not present

## 2016-05-23 LAB — NM MYOCAR MULTI W/SPECT W/WALL MOTION / EF
LV dias vol: 67 mL (ref 46–106)
LV sys vol: 14 mL
Peak HR: 86 {beats}/min
RATE: 0.23
Rest HR: 75 {beats}/min
SDS: 5
SRS: 3
SSS: 8
TID: 0.81

## 2016-05-23 MED ORDER — TECHNETIUM TC 99M TETROFOSMIN IV KIT
30.0000 | PACK | Freq: Once | INTRAVENOUS | Status: AC | PRN
  Administered 2016-05-23: 30 via INTRAVENOUS

## 2016-05-23 MED ORDER — SODIUM CHLORIDE 0.9% FLUSH
INTRAVENOUS | Status: AC
  Administered 2016-05-23: 10 mL via INTRAVENOUS
  Filled 2016-05-23: qty 10

## 2016-05-23 MED ORDER — REGADENOSON 0.4 MG/5ML IV SOLN
INTRAVENOUS | Status: AC
  Administered 2016-05-23: 0.4 mg via INTRAVENOUS
  Filled 2016-05-23: qty 5

## 2016-05-23 MED ORDER — TECHNETIUM TC 99M TETROFOSMIN IV KIT
10.0000 | PACK | Freq: Once | INTRAVENOUS | Status: AC | PRN
  Administered 2016-05-23: 10 via INTRAVENOUS

## 2016-05-23 NOTE — Progress Notes (Signed)
*  PRELIMINARY RESULTS* Echocardiogram 2D Echocardiogram has been performed.  Martha Richard 05/23/2016, 10:33 AM

## 2016-05-28 ENCOUNTER — Telehealth: Payer: Self-pay | Admitting: *Deleted

## 2016-05-28 DIAGNOSIS — I350 Nonrheumatic aortic (valve) stenosis: Secondary | ICD-10-CM

## 2016-05-28 NOTE — Telephone Encounter (Signed)
Result Notes   Notes recorded by Herminio Commons, MD on 05/28/2016 at 12:31 PM EDT Normal pumping function. Calcium buildup on aortic valve with mild to moderate narrowing. Will repeat echo in 1-2 years.

## 2016-05-28 NOTE — Telephone Encounter (Signed)
Patient informed and copy sent to Dr. Maureen Ralphs.

## 2016-05-28 NOTE — Telephone Encounter (Signed)
Patient informed. 

## 2016-05-28 NOTE — Telephone Encounter (Signed)
-----   Message from Pitkin sent at 05/28/2016 10:41 AM EDT -----   ----- Message ----- From: Herminio Commons, MD Sent: 05/28/2016  10:32 AM To: Massie Maroon, CMA  Normal.

## 2016-05-28 NOTE — Telephone Encounter (Signed)
Can proceed with surgery. Aortic stenosis is diagnosis for echo.

## 2016-06-02 ENCOUNTER — Telehealth: Payer: Self-pay | Admitting: Cardiovascular Disease

## 2016-06-02 NOTE — Telephone Encounter (Signed)
Calling about clearance.  Stated he has not been received by doctor

## 2016-06-02 NOTE — Telephone Encounter (Signed)
Patient notified note will be sent again.  Previously sent on 05/28/2016.

## 2016-06-12 ENCOUNTER — Ambulatory Visit: Payer: Self-pay | Admitting: Orthopedic Surgery

## 2016-06-13 ENCOUNTER — Other Ambulatory Visit: Payer: Self-pay | Admitting: Orthopedic Surgery

## 2016-06-16 NOTE — Progress Notes (Signed)
Stress test 05/23/16 epic  05/23/16 echo epic 05/15/16 ekg epic  04/29/16 clearance on chart 05/28/16 telephone note cards clearance . epic

## 2016-06-16 NOTE — Patient Instructions (Addendum)
Martha Richard  06/16/2016   Your procedure is scheduled on: 06/18/16  Report to [facility] Main  Entrance   Report to admitting at       1245pm.     Call this number if you have problems the morning of surgery [phone]   Remember: ONLY 1 PERSON MAY GO WITH YOU TO SHORT STAY TO GET  READY MORNING OF [place].   Do not eat food :After Midnight.  You may have clear liquid until 0830 am then [place] Allowed                                                                     Foods Excluded  Coffee and tea, regular and decaf                             liquids that you cannot  Plain Jell-O in any flavor                                             see through such as: Fruit ices (not with fruit pulp)                                     milk, soups, orange juice  Iced Popsicles                                    All solid food Carbonated beverages, regular and diet                                    Cranberry, grape and apple juices Sports drinks like Gatorade Lightly seasoned clear broth or consume(fat free) Sugar, honey syrup  Sample Menu Breakfast                                Lunch                                     Supper Cranberry juice                    Beef broth                            Chicken broth Jell-O                                     Grape juice  Apple juice Coffee or tea                        Jell-O                                      Popsicle                                                Coffee or tea                        Coffee or tea  _____________________________________________________________________     Take these medicines the morning of surgery with A SIP OF WATER: none                                You may not have any metal on your body including hair pins and              piercings  Do not wear jewelry, make-up, lotions, powders or  perfumes, deodorant             Do not wear nail polish.  Do not shave  48 hours prior to surgery.     Do not bring valuables to the hospital. [place].  Contacts, dentures or bridgework may not be worn into surgery.  Leave suitcase in the car. After surgery it may be brought to your room.                 Please read over the following fact sheets you were given: _____________________________________________________________________             [facility] - Preparing for Surgery Before surgery, you can play an important role.  Because skin is not sterile, your skin needs to be as free of germs as possible.  You can reduce the number of germs on your skin by washing with CHG (chlorahexidine gluconate) soap before surgery.  CHG is an antiseptic cleaner which kills germs and bonds with the skin to continue killing germs even after washing. Please DO NOT use if you have an allergy to CHG or antibacterial soaps.  If your skin becomes reddened/irritated stop using the CHG and inform your nurse when you arrive at Short Stay. Do not shave (including legs and underarms) for at least 48 hours prior to the first CHG shower.  You may shave your face/neck. Please follow these instructions carefully:  1.  Shower with CHG Soap the night before surgery and the  morning of Surgery.  2.  If you choose to wash your hair, wash your hair first as usual with your  normal  shampoo.  3.  After you shampoo, rinse your hair and body thoroughly to remove the  shampoo.                           4.  Use CHG as you would any other liquid soap.  You can apply chg directly  to the skin and wash  Gently with a scrungie or clean washcloth.  5.  Apply the CHG Soap to your body ONLY FROM THE NECK DOWN.   Do not use on face/ open                           Wound or open sores. Avoid contact with eyes, ears mouth and genitals (private parts).                        Wash face,  Genitals (private parts) with your normal soap.             6.  Wash thoroughly, paying special attention to the area where your surgery  will be performed.  7.  Thoroughly rinse your body with warm water from the neck down.  8.  DO NOT shower/wash with your normal soap after using and rinsing off  the CHG Soap.                9.  Pat yourself dry with a clean towel.            10.  Wear clean pajamas.            11.  Place clean sheets on your bed the night of your first shower and do not  sleep with pets. Day of Surgery : Do not apply any lotions/deodorants the morning of surgery.  Please wear clean clothes to the hospital/surgery center.  FAILURE TO FOLLOW THESE INSTRUCTIONS MAY RESULT IN THE CANCELLATION OF YOUR SURGERY PATIENT SIGNATURE_________________________________  NURSE SIGNATURE__________________________________  ________________________________________________________________________  WHAT IS A BLOOD TRANSFUSION? Blood Transfusion Information  A transfusion is the replacement of blood or some of its parts. Blood is made up of multiple cells which provide different functions.  Red blood cells carry oxygen and are used for blood loss replacement.  White blood cells fight against infection.  Platelets control bleeding.  Plasma helps clot blood.  Other blood products are available for specialized needs, such as hemophilia or other clotting disorders. BEFORE THE TRANSFUSION  Who gives blood for transfusions?   Healthy volunteers who are fully evaluated to make sure their blood is safe. This is blood bank blood. Transfusion therapy is the safest it has ever been in the practice of medicine. Before blood is taken from a donor, a complete history is taken to make sure that person has no history of diseases nor engages in risky social behavior (examples are intravenous drug use or sexual activity with multiple partners). The donor's travel history is screened  to minimize risk of transmitting infections, such as malaria. The donated blood is tested for signs of infectious diseases, such as HIV and hepatitis. The blood is then tested to be sure it is compatible with you in order to minimize the chance of a transfusion reaction. If you or a relative donates blood, this is often done in anticipation of surgery and is not appropriate for emergency situations. It takes many days to process the donated blood. RISKS AND COMPLICATIONS Although transfusion therapy is very safe and saves many lives, the main dangers of transfusion include:   Getting an infectious disease.  Developing a transfusion reaction. This is an allergic reaction to something in the blood you were given. Every precaution is taken to prevent this. The decision to have a blood transfusion has been considered carefully by your caregiver before blood is given. Blood is not given unless the benefits  outweigh the risks. AFTER THE TRANSFUSION  Right after receiving a blood transfusion, you will usually feel much better and more energetic. This is especially true if your red blood cells have gotten low (anemic). The transfusion raises the level of the red blood cells which carry oxygen, and this usually causes an energy increase.  The nurse administering the transfusion will monitor you carefully for complications. HOME CARE INSTRUCTIONS  No special instructions are needed after a transfusion. You may find your energy is better. Speak with your caregiver about any limitations on activity for underlying diseases you may have. SEEK MEDICAL CARE IF:   Your condition is not improving after your transfusion.  You develop redness or irritation at the intravenous (IV) site. SEEK IMMEDIATE MEDICAL CARE IF:  Any of the following symptoms occur over the next 12 hours:  Shaking chills.  You have a temperature by mouth above 102 F (38.9 C), not controlled by medicine.  Chest, back, or muscle  pain.  People around you feel you are not acting correctly or are confused.  Shortness of breath or difficulty breathing.  Dizziness and fainting.  You get a rash or develop hives.  You have a decrease in urine output.  Your urine turns a dark color or changes to pink, red, or brown. Any of the following symptoms occur over the next 10 days:  You have a temperature by mouth above 102 F (38.9 C), not controlled by medicine.  Shortness of breath.  Weakness after normal activity.  The white part of the eye turns yellow (jaundice).  You have a decrease in the amount of urine or are urinating less often.  Your urine turns a dark color or changes to pink, red, or brown. Document Released: 02/08/2000 Document Revised: 05/05/2011 Document Reviewed: 09/27/2007 ExitCare Patient Information 2014 [place].  _______________________________________________________________________  Incentive Spirometer  An incentive spirometer is a tool that can help keep your lungs clear and active. This tool measures how well you are filling your lungs with each breath. Taking long deep breaths may help reverse or decrease the chance of developing breathing (pulmonary) problems (especially infection) following:  A long period of time when you are unable to move or be active. BEFORE THE PROCEDURE   If the spirometer includes an indicator to show your best effort, your nurse or respiratory therapist will set it to a desired goal.  If possible, sit up straight or lean slightly forward. Try not to slouch.  Hold the incentive spirometer in an upright position. INSTRUCTIONS FOR USE  1. Sit on the edge of your bed if possible, or sit up as far as you can in bed or on a chair. 2. Hold the incentive spirometer in an upright position. 3. Breathe out normally. 4. Place the mouthpiece in your mouth and seal your lips tightly around it. 5. Breathe in slowly and as deeply as possible, raising the piston  or the ball toward the top of the column. 6. Hold your breath for 3-5 seconds or for as long as possible. Allow the piston or ball to fall to the bottom of the column. 7. Remove the mouthpiece from your mouth and breathe out normally. 8. Rest for a few seconds and repeat Steps 1 through 7 at least 10 times every 1-2 hours when you are awake. Take your time and take a few normal breaths between deep breaths. 9. The spirometer may include an indicator to show your best effort. Use the indicator as a goal to  work toward during each repetition. 10. After each set of 10 deep breaths, practice coughing to be sure your lungs are clear. If you have an incision (the cut made at the time of surgery), support your incision when coughing by placing a pillow or rolled up towels firmly against it. Once you are able to get out of bed, walk around indoors and cough well. You may stop using the incentive spirometer when instructed by your caregiver.  RISKS AND COMPLICATIONS  Take your time so you do not get dizzy or light-headed.  If you are in pain, you may need to take or ask for pain medication before doing incentive spirometry. It is harder to take a deep breath if you are having pain. AFTER USE  Rest and breathe slowly and easily.  It can be helpful to keep track of a log of your progress. Your caregiver can provide you with a simple table to help with this. If you are using the spirometer at home, follow these instructions: [place] IF:   You are having difficultly using the spirometer.  You have trouble using the spirometer as often as instructed.  Your pain medication is not giving enough relief while using the spirometer.  You develop fever of 100.5 F (38.1 C) or higher. SEEK IMMEDIATE MEDICAL CARE IF:   You cough up bloody sputum that had not been present before.  You develop fever of 102 F (38.9 C) or greater.  You develop worsening pain at or near the incision site. MAKE  SURE YOU:   Understand these instructions.  Will watch your condition.  Will get help right away if you are not doing well or get worse. Document Released: 06/23/2006 Document Revised: 05/05/2011 Document Reviewed: 08/24/2006 [facility] Patient Information 2014 [place], [place].   ________________________________________________________________________

## 2016-06-17 ENCOUNTER — Ambulatory Visit: Payer: Self-pay | Admitting: Orthopedic Surgery

## 2016-06-17 ENCOUNTER — Encounter (HOSPITAL_COMMUNITY): Payer: Self-pay

## 2016-06-17 ENCOUNTER — Encounter (HOSPITAL_COMMUNITY)
Admission: RE | Admit: 2016-06-17 | Discharge: 2016-06-17 | Disposition: A | Discharge status: Home / Self Care | Payer: Medicare Other | Source: Ambulatory Visit | Attending: Orthopedic Surgery | Admitting: Orthopedic Surgery

## 2016-06-17 ENCOUNTER — Other Ambulatory Visit (HOSPITAL_COMMUNITY): Payer: Medicare Other

## 2016-06-17 DIAGNOSIS — I10 Essential (primary) hypertension: Secondary | ICD-10-CM | POA: Diagnosis not present

## 2016-06-17 DIAGNOSIS — I447 Left bundle-branch block, unspecified: Secondary | ICD-10-CM | POA: Diagnosis not present

## 2016-06-17 DIAGNOSIS — M1612 Unilateral primary osteoarthritis, left hip: Secondary | ICD-10-CM | POA: Diagnosis not present

## 2016-06-17 DIAGNOSIS — I352 Nonrheumatic aortic (valve) stenosis with insufficiency: Secondary | ICD-10-CM | POA: Diagnosis not present

## 2016-06-17 DIAGNOSIS — K30 Functional dyspepsia: Secondary | ICD-10-CM | POA: Diagnosis not present

## 2016-06-17 DIAGNOSIS — Z87891 Personal history of nicotine dependence: Secondary | ICD-10-CM | POA: Diagnosis not present

## 2016-06-17 LAB — CBC
HCT: 41.6 % (ref 36.0–46.0)
Hemoglobin: 13.8 g/dL (ref 12.0–15.0)
MCH: 29.1 pg (ref 26.0–34.0)
MCHC: 33.2 g/dL (ref 30.0–36.0)
MCV: 87.8 fL (ref 78.0–100.0)
Platelets: 284 10*3/uL (ref 150–400)
RBC: 4.74 MIL/uL (ref 3.87–5.11)
RDW: 13.6 % (ref 11.5–15.5)
WBC: 9.1 10*3/uL (ref 4.0–10.5)

## 2016-06-17 LAB — COMPREHENSIVE METABOLIC PANEL
ALT: 12 U/L — ABNORMAL LOW (ref 14–54)
AST: 21 U/L (ref 15–41)
Albumin: 3.8 g/dL (ref 3.5–5.0)
Alkaline Phosphatase: 78 U/L (ref 38–126)
Anion gap: 7 (ref 5–15)
BUN: 17 mg/dL (ref 6–20)
CO2: 27 mmol/L (ref 22–32)
Calcium: 9.4 mg/dL (ref 8.9–10.3)
Chloride: 103 mmol/L (ref 101–111)
Creatinine, Ser: 0.86 mg/dL (ref 0.44–1.00)
GFR calc Af Amer: >60 mL/min (ref >60)
GFR calc non Af Amer: 60 mL/min — ABNORMAL LOW (ref >60)
Glucose, Bld: 103 mg/dL — ABNORMAL HIGH (ref 65–99)
Potassium: 4 mmol/L (ref 3.5–5.1)
Sodium: 137 mmol/L (ref 135–145)
Total Bilirubin: 0.5 mg/dL (ref 0.3–1.2)
Total Protein: 6.7 g/dL (ref 6.5–8.1)

## 2016-06-17 LAB — SURGICAL PCR SCREEN
MRSA, PCR: NEGATIVE
Staphylococcus aureus: NEGATIVE

## 2016-06-17 LAB — APTT: aPTT: 29 seconds (ref 24–36)

## 2016-06-17 LAB — PROTIME-INR
INR: 0.96
Prothrombin Time: 12.7 seconds (ref 11.4–15.2)

## 2016-06-17 NOTE — H&P (Signed)
Martha Richard DOB: [birth year] Married / Language: English / Race: White Female Date of Admission:  06/18/2016 CC:  Left Hip Pain History of Present Illness  The patient is a 83 year old female who comes in for a preoperative History and Physical. The patient is scheduled for a left total hip arthroplasty (anterior) to be performed by Dr. Dione Plover. Aluisio, MD at [facility] on 06-18-2016. The patient is a 83 year old female who presented with a hip problem. The patient reports left hip problems including pain symptoms that have been present for over 6 month(s). The symptoms began without any known injury. Symptoms reported include hip pain The patient reports symptoms radiating to the: left groin and left thigh. The patient describes the hip problem as sharp, dull and aching. Onset of symptoms was with symptoms now occuring constantly (worse with walking).The patient feels as if their symptoms are does feel they are worsening. She has had pain in the left anterior groin for over six months. It came on without trauma. She has had some treatment for this hip three or four years ago where she had an injection into her hip, but it was not done under x-ray or ultrasound guidance and she states it did not help at all. Here recently, she has been having more discomfort in the left groin. It radiated into the anterior left thigh. It is worse with weightbearing and better when she is off it. X-rays show advanced osteoarthritis in the left hip with bone on bone superior weightbearing dome finding, cystic degenerative change in both the acetabular and up femoral head side. She is 87, but overall in good health. She is going to become severely debilitated if we do not get this fixed. At this point, the most predictive means of improving her pain and function is going be total hip arthroplasty. We discussed in detail and she wants to proceed. They have been treated conservatively in the past for the above  stated problem and despite conservative measures, they continue to have progressive pain and severe functional limitations and dysfunction. They have failed non-operative management including home exercise, medications, and injections. It is felt that they would benefit from undergoing total joint replacement. Risks and benefits of the procedure have been discussed with the patient and they elect to proceed with surgery. There are no active contraindications to surgery such as ongoing infection or rapidly progressive neurological disease. Her surgery was originally scheduled for 05/21/2016 but had to be cancelled due to new onset of a bundle branch block at her pre-op. She has been seen recently by her medical physician and also sent to a cardiologist prior to surgery.   Problem List/Past Medical  Impaired fasting glucose (R73.01)  Pain of left hip joint (M25.552)  Primary osteoarthritis of left hip (M16.12)  Aortic stenosis (I35.0)  High blood pressure  Skin Cancer  Squamous Cell Hypercholesterolemia  Shingles  Cataract  Hemorrhoids  Bursitis  Measles  Mumps  Orthostatic Hypotension  Gastroesophageal Reflux Disease   Allergies Peanut PRODUCTS Aspirin gastric burning and bleeding Penicillins - Rash, Itching. ACE Inhibitors - Cough. Atenolol *BETA BLOCKERS - Bradycardia  Family History Cancer  Father. Cerebrovascular Accident  Father. Diabetes Mellitus  Brother, Maternal Grandfather, Maternal Grandmother. First Degree Relatives  reported Heart Disease  Mother. Heart disease in female family member before age 39  Hypertension  Mother. Osteoarthritis  Mother.  Social History Children  2 Current work status  retired Furniture conservator/restorer rarely Living situation  live with spouse Marital status  married Never consumed alcohol  03/27/2016: Never consumed alcohol No history of drug/alcohol rehab  Not under pain contract  Number of flights of stairs  before winded  less than 1 Tobacco / smoke exposure  03/27/2016: no Tobacco use  Former smoker. 03/27/2016: smoke(d) less than 1/2 pack(s) per day Advance Directives  Living Will  Medication History Chlorthalidone (25MG  Tablet, Oral) Active.  Past Surgical History Breast Biopsy  Date: 07/1971. left Breast Mass; Local Excision  left Appendectomy  Date: 1948. Benign Fibroadenoma  around 1955  Review of Systems General Not Present- Chills, Fatigue, Fever, Memory Loss, Night Sweats, Weight Gain and Weight Loss. Skin Not Present- Eczema, Hives, Itching, Lesions and Rash. HEENT Not Present- Dentures, Double Vision, Headache, Hearing Loss, Tinnitus and Visual Loss. Respiratory Present- Shortness of breath with exertion. Not Present- Allergies, Chronic Cough, Coughing up blood and Shortness of breath at rest. Cardiovascular Not Present- Chest Pain, Difficulty Breathing Lying Down, Murmur, Palpitations, Racing/skipping heartbeats and Swelling. Gastrointestinal Not Present- Abdominal Pain, Bloody Stool, Constipation, Diarrhea, Difficulty Swallowing, Heartburn, Jaundice, Loss of appetitie, Nausea and Vomiting. Female Genitourinary Not Present- Blood in Urine, Discharge, Flank Pain, Incontinence, Painful Urination, Urgency, Urinary frequency, Urinary Retention, Urinating at Night and Weak urinary stream. Musculoskeletal Present- Joint Pain. Not Present- Back Pain, Joint Swelling, Morning Stiffness, Muscle Pain, Muscle Weakness and Spasms. Neurological Not Present- Blackout spells, Difficulty with balance, Dizziness, Paralysis, Tremor and Weakness. Psychiatric Not Present- Insomnia.  Vitals  Weight: 162 lb Height: 63in Weight was reported by patient. Height was reported by patient. Body Surface Area: 1.77 m Body Mass Index: 28.7 kg/m  Pulse: 60 (Regular)  BP: 146/78 (Sitting, Right Arm, Standard)  Physical Exam General Mental Status -Alert, cooperative and good  historian. General Appearance-pleasant, Not in acute distress. Orientation-Oriented X3. Build & Nutrition-Well nourished and Well developed.  Head and Neck Head-normocephalic, atraumatic . Neck Global Assessment - supple, no bruit auscultated on the right, no bruit auscultated on the left.  Eye Vision-Wears corrective lenses. Pupil - Bilateral-Regular and Round. Motion - Bilateral-EOMI.  ENMT Note: partial upper and lower dentures bilateral hearing aids  Chest and Lung Exam Auscultation Breath sounds - clear at anterior chest wall and clear at posterior chest wall. Adventitious sounds - No Adventitious sounds.  Cardiovascular Auscultation Rhythm - Regular rate and rhythm. Heart Sounds - S1 WNL and S2 WNL. Murmurs & Other Heart Sounds - Auscultation of the heart reveals - No Murmurs.  Abdomen Palpation/Percussion Tenderness - Abdomen is non-tender to palpation. Rigidity (guarding) - Abdomen is soft. Auscultation Auscultation of the abdomen reveals - Bowel sounds normal.  Female Genitourinary Note: Not done, not pertinent to present illness  Musculoskeletal Note: She is in no distress. Her left hip can be flexed to 90. No internal rotation, about 10 to 20 of external rotation, 20 of abduction. Right hip range of motion is normal.  Assessment & Plan Primary osteoarthritis of left hip (M16.12)  Note:Surgical Plans: Left Total Hip Replacement - Anterior Approach  Disposition: Home with Husband  PCP: Dr. Quintin Alto Cards: Dr. Raliegh Ip in [place]  IV TXA  Anesthesia Issues: None but quite sensitive to medications  Patient was instructed on what medications to stop prior to surgery.  Signed electronically by Joelene Millin, III PA-C

## 2016-06-18 ENCOUNTER — Encounter (HOSPITAL_COMMUNITY): Payer: Self-pay | Admitting: *Deleted

## 2016-06-18 ENCOUNTER — Inpatient Hospital Stay (HOSPITAL_COMMUNITY)
Admission: RE | Admit: 2016-06-18 | Discharge: 2016-06-20 | DRG: 470 | Disposition: A | Discharge status: Home Health | Payer: Medicare Other | Source: Ambulatory Visit | Attending: Orthopedic Surgery | Admitting: Orthopedic Surgery

## 2016-06-18 ENCOUNTER — Encounter (HOSPITAL_COMMUNITY)
Admission: RE | Disposition: A | Discharge status: Home Health | Payer: Self-pay | Source: Ambulatory Visit | Attending: Orthopedic Surgery

## 2016-06-18 ENCOUNTER — Inpatient Hospital Stay (HOSPITAL_COMMUNITY): Payer: Medicare Other

## 2016-06-18 ENCOUNTER — Inpatient Hospital Stay (HOSPITAL_COMMUNITY): Payer: Medicare Other | Admitting: Certified Registered Nurse Anesthetist

## 2016-06-18 DIAGNOSIS — K30 Functional dyspepsia: Secondary | ICD-10-CM | POA: Diagnosis not present

## 2016-06-18 DIAGNOSIS — M169 Osteoarthritis of hip, unspecified: Secondary | ICD-10-CM

## 2016-06-18 DIAGNOSIS — R002 Palpitations: Secondary | ICD-10-CM | POA: Diagnosis not present

## 2016-06-18 DIAGNOSIS — M1612 Unilateral primary osteoarthritis, left hip: Secondary | ICD-10-CM | POA: Diagnosis not present

## 2016-06-18 DIAGNOSIS — I447 Left bundle-branch block, unspecified: Secondary | ICD-10-CM | POA: Diagnosis present

## 2016-06-18 DIAGNOSIS — Z87891 Personal history of nicotine dependence: Secondary | ICD-10-CM | POA: Diagnosis not present

## 2016-06-18 DIAGNOSIS — Z96649 Presence of unspecified artificial hip joint: Secondary | ICD-10-CM

## 2016-06-18 DIAGNOSIS — Z96642 Presence of left artificial hip joint: Secondary | ICD-10-CM | POA: Diagnosis not present

## 2016-06-18 DIAGNOSIS — I10 Essential (primary) hypertension: Secondary | ICD-10-CM | POA: Diagnosis present

## 2016-06-18 DIAGNOSIS — Z471 Aftercare following joint replacement surgery: Secondary | ICD-10-CM | POA: Diagnosis not present

## 2016-06-18 DIAGNOSIS — I352 Nonrheumatic aortic (valve) stenosis with insufficiency: Secondary | ICD-10-CM | POA: Diagnosis not present

## 2016-06-18 HISTORY — PX: TOTAL HIP ARTHROPLASTY: SHX124

## 2016-06-18 LAB — TYPE AND SCREEN
ABO/RH(D): O POS
Antibody Screen: NEGATIVE

## 2016-06-18 SURGERY — ARTHROPLASTY, HIP, TOTAL, ANTERIOR APPROACH
Anesthesia: General | Site: Hip | Laterality: Left

## 2016-06-18 MED ORDER — CHLORTHALIDONE 25 MG PO TABS: 6.2500 mg | ORAL_TABLET | Freq: Every day | ORAL | Status: DC | PRN

## 2016-06-18 MED ORDER — OXYCODONE HCL 5 MG PO TABS
5.0000 mg | ORAL_TABLET | ORAL | Status: DC | PRN
  Administered 2016-06-20: 5 mg via ORAL
  Filled 2016-06-18: qty 1

## 2016-06-18 MED ORDER — ONDANSETRON HCL 4 MG/2ML IJ SOLN: 4.0000 mg | Freq: Four times a day (QID) | INTRAMUSCULAR | Status: DC | PRN

## 2016-06-18 MED ORDER — SODIUM CHLORIDE 0.9 % IV SOLN
INTRAVENOUS | Status: DC
  Administered 2016-06-19: 01:00:00 via INTRAVENOUS

## 2016-06-18 MED ORDER — FENTANYL CITRATE (PF) 100 MCG/2ML IJ SOLN
INTRAMUSCULAR | Status: AC
  Filled 2016-06-18: qty 2

## 2016-06-18 MED ORDER — RIVAROXABAN 10 MG PO TABS
10.0000 mg | ORAL_TABLET | Freq: Every day | ORAL | Status: DC
  Administered 2016-06-19 – 2016-06-20 (×2): 10 mg via ORAL
  Filled 2016-06-18 (×2): qty 1

## 2016-06-18 MED ORDER — METHOCARBAMOL 500 MG PO TABS: 500.0000 mg | ORAL_TABLET | Freq: Four times a day (QID) | ORAL | Status: DC | PRN

## 2016-06-18 MED ORDER — FLEET ENEMA 7-19 GM/118ML RE ENEM
1.0000 | ENEMA | Freq: Once | RECTAL | Status: DC | PRN
Start: 2016-06-18 — End: 2016-06-20

## 2016-06-18 MED ORDER — ACETAMINOPHEN 650 MG RE SUPP: 650.0000 mg | Freq: Four times a day (QID) | RECTAL | Status: DC | PRN

## 2016-06-18 MED ORDER — FENTANYL CITRATE (PF) 100 MCG/2ML IJ SOLN
INTRAMUSCULAR | Status: DC | PRN
  Administered 2016-06-18 (×4): 50 ug via INTRAVENOUS

## 2016-06-18 MED ORDER — ONDANSETRON HCL 4 MG/2ML IJ SOLN
4.0000 mg | Freq: Four times a day (QID) | INTRAMUSCULAR | Status: DC | PRN
  Administered 2016-06-19: 4 mg via INTRAVENOUS
  Filled 2016-06-18: qty 2

## 2016-06-18 MED ORDER — ROCURONIUM BROMIDE 50 MG/5ML IV SOSY
PREFILLED_SYRINGE | INTRAVENOUS | Status: AC
  Filled 2016-06-18: qty 5

## 2016-06-18 MED ORDER — CHLORHEXIDINE GLUCONATE 4 % EX LIQD: 60.0000 mL | Freq: Once | CUTANEOUS | Status: DC

## 2016-06-18 MED ORDER — ETOMIDATE 2 MG/ML IV SOLN
INTRAVENOUS | Status: AC
  Filled 2016-06-18: qty 10

## 2016-06-18 MED ORDER — ACETAMINOPHEN 500 MG PO TABS
1000.0000 mg | ORAL_TABLET | Freq: Four times a day (QID) | ORAL | Status: AC
  Administered 2016-06-18: 1000 mg via ORAL
  Filled 2016-06-18: qty 2

## 2016-06-18 MED ORDER — BISACODYL 10 MG RE SUPP: 10.0000 mg | Freq: Every day | RECTAL | Status: DC | PRN

## 2016-06-18 MED ORDER — ACETAMINOPHEN 10 MG/ML IV SOLN
1000.0000 mg | Freq: Once | INTRAVENOUS | Status: AC
  Administered 2016-06-18: 1000 mg via INTRAVENOUS

## 2016-06-18 MED ORDER — SODIUM CHLORIDE 0.9 % IR SOLN
Status: DC | PRN
  Administered 2016-06-18: 1000 mL

## 2016-06-18 MED ORDER — LIDOCAINE 2% (20 MG/ML) 5 ML SYRINGE
INTRAMUSCULAR | Status: AC
  Filled 2016-06-18: qty 5

## 2016-06-18 MED ORDER — EPHEDRINE 5 MG/ML INJ
INTRAVENOUS | Status: AC
  Filled 2016-06-18: qty 10

## 2016-06-18 MED ORDER — ONDANSETRON HCL 4 MG PO TABS: 4.0000 mg | ORAL_TABLET | Freq: Four times a day (QID) | ORAL | Status: DC | PRN

## 2016-06-18 MED ORDER — LABETALOL HCL 5 MG/ML IV SOLN
INTRAVENOUS | Status: AC
  Filled 2016-06-18: qty 4

## 2016-06-18 MED ORDER — DEXAMETHASONE SODIUM PHOSPHATE 10 MG/ML IJ SOLN
INTRAMUSCULAR | Status: AC
  Filled 2016-06-18: qty 1

## 2016-06-18 MED ORDER — SUGAMMADEX SODIUM 200 MG/2ML IV SOLN
INTRAVENOUS | Status: AC
  Filled 2016-06-18: qty 2

## 2016-06-18 MED ORDER — ETOMIDATE 2 MG/ML IV SOLN
INTRAVENOUS | Status: DC | PRN
  Administered 2016-06-18: 16 mg via INTRAVENOUS

## 2016-06-18 MED ORDER — MENTHOL 3 MG MT LOZG: 1.0000 | LOZENGE | OROMUCOSAL | Status: DC | PRN

## 2016-06-18 MED ORDER — LABETALOL HCL 5 MG/ML IV SOLN
INTRAVENOUS | Status: DC | PRN
  Administered 2016-06-18 (×2): 5 mg via INTRAVENOUS

## 2016-06-18 MED ORDER — PHENYLEPHRINE HCL 10 MG/ML IJ SOLN
INTRAMUSCULAR | Status: AC
  Filled 2016-06-18: qty 1

## 2016-06-18 MED ORDER — TRANEXAMIC ACID 1000 MG/10ML IV SOLN
1000.0000 mg | INTRAVENOUS | Status: AC
  Administered 2016-06-18: 1000 mg via INTRAVENOUS
  Filled 2016-06-18: qty 1100

## 2016-06-18 MED ORDER — METOCLOPRAMIDE HCL 5 MG/ML IJ SOLN
5.0000 mg | Freq: Three times a day (TID) | INTRAMUSCULAR | Status: DC | PRN
  Administered 2016-06-18: 21:00:00 10 mg via INTRAVENOUS
  Filled 2016-06-18: qty 2

## 2016-06-18 MED ORDER — LACTATED RINGERS IV SOLN
INTRAVENOUS | Status: DC
  Administered 2016-06-18 (×3): via INTRAVENOUS

## 2016-06-18 MED ORDER — DOCUSATE SODIUM 100 MG PO CAPS
100.0000 mg | ORAL_CAPSULE | Freq: Two times a day (BID) | ORAL | Status: DC
  Administered 2016-06-19 (×2): 100 mg via ORAL
  Filled 2016-06-18 (×3): qty 1

## 2016-06-18 MED ORDER — DIPHENHYDRAMINE HCL 12.5 MG/5ML PO ELIX: 12.5000 mg | ORAL_SOLUTION | ORAL | Status: DC | PRN

## 2016-06-18 MED ORDER — CEFAZOLIN SODIUM-DEXTROSE 2-4 GM/100ML-% IV SOLN
2.0000 g | INTRAVENOUS | Status: AC
  Administered 2016-06-18: 2 g via INTRAVENOUS

## 2016-06-18 MED ORDER — FENTANYL CITRATE (PF) 100 MCG/2ML IJ SOLN
25.0000 ug | INTRAMUSCULAR | Status: DC | PRN
  Administered 2016-06-18: 50 ug via INTRAVENOUS

## 2016-06-18 MED ORDER — DEXAMETHASONE SODIUM PHOSPHATE 10 MG/ML IJ SOLN
10.0000 mg | Freq: Once | INTRAMUSCULAR | Status: AC
  Administered 2016-06-18: 10 mg via INTRAVENOUS

## 2016-06-18 MED ORDER — BUPIVACAINE HCL (PF) 0.25 % IJ SOLN
INTRAMUSCULAR | Status: AC
  Filled 2016-06-18: qty 30

## 2016-06-18 MED ORDER — METHOCARBAMOL 1000 MG/10ML IJ SOLN
500.0000 mg | Freq: Four times a day (QID) | INTRAVENOUS | Status: DC | PRN
  Administered 2016-06-18: 500 mg via INTRAVENOUS
  Filled 2016-06-18: qty 550

## 2016-06-18 MED ORDER — OXYCODONE HCL 5 MG/5ML PO SOLN
5.0000 mg | Freq: Once | ORAL | Status: DC | PRN
  Filled 2016-06-18: qty 5

## 2016-06-18 MED ORDER — DEXAMETHASONE SODIUM PHOSPHATE 4 MG/ML IJ SOLN
INTRAMUSCULAR | Status: DC | PRN
  Administered 2016-06-18: 10 mg via INTRAVENOUS

## 2016-06-18 MED ORDER — ACETAMINOPHEN 10 MG/ML IV SOLN
INTRAVENOUS | Status: AC
  Filled 2016-06-18: qty 100

## 2016-06-18 MED ORDER — BUPIVACAINE HCL (PF) 0.25 % IJ SOLN
INTRAMUSCULAR | Status: DC | PRN
  Administered 2016-06-18: 30 mL

## 2016-06-18 MED ORDER — ROCURONIUM BROMIDE 100 MG/10ML IV SOLN
INTRAVENOUS | Status: DC | PRN
  Administered 2016-06-18: 50 mg via INTRAVENOUS

## 2016-06-18 MED ORDER — SUGAMMADEX SODIUM 200 MG/2ML IV SOLN
INTRAVENOUS | Status: DC | PRN
  Administered 2016-06-18: 150 mg via INTRAVENOUS

## 2016-06-18 MED ORDER — DEXAMETHASONE SODIUM PHOSPHATE 10 MG/ML IJ SOLN
10.0000 mg | Freq: Once | INTRAMUSCULAR | Status: AC
  Administered 2016-06-19: 10:00:00 10 mg via INTRAVENOUS
  Filled 2016-06-18: qty 1

## 2016-06-18 MED ORDER — CEFAZOLIN SODIUM-DEXTROSE 2-4 GM/100ML-% IV SOLN
INTRAVENOUS | Status: AC
  Filled 2016-06-18: qty 100

## 2016-06-18 MED ORDER — NON FORMULARY: 6.2500 mg | Freq: Every day | Status: DC | PRN

## 2016-06-18 MED ORDER — PHENYLEPHRINE HCL 10 MG/ML IJ SOLN
INTRAVENOUS | Status: DC | PRN
  Administered 2016-06-18: 30 ug/min via INTRAVENOUS

## 2016-06-18 MED ORDER — NONFORMULARY OR COMPOUNDED ITEM
6.2500 mg | Freq: Every day | Status: DC | PRN
  Filled 2016-06-18: qty 1

## 2016-06-18 MED ORDER — TRANEXAMIC ACID 1000 MG/10ML IV SOLN
1000.0000 mg | Freq: Once | INTRAVENOUS | Status: AC
  Administered 2016-06-18: 19:00:00 1000 mg via INTRAVENOUS
  Filled 2016-06-18: qty 1100

## 2016-06-18 MED ORDER — OXYCODONE HCL 5 MG PO TABS: 5.0000 mg | ORAL_TABLET | Freq: Once | ORAL | Status: DC | PRN

## 2016-06-18 MED ORDER — CEFAZOLIN SODIUM-DEXTROSE 2-4 GM/100ML-% IV SOLN
2.0000 g | Freq: Four times a day (QID) | INTRAVENOUS | Status: AC
  Administered 2016-06-18 – 2016-06-19 (×2): 2 g via INTRAVENOUS

## 2016-06-18 MED ORDER — METOCLOPRAMIDE HCL 5 MG PO TABS: 5.0000 mg | ORAL_TABLET | Freq: Three times a day (TID) | ORAL | Status: DC | PRN

## 2016-06-18 MED ORDER — LIDOCAINE HCL (CARDIAC) 20 MG/ML IV SOLN
INTRAVENOUS | Status: DC | PRN
  Administered 2016-06-18: 60 mg via INTRAVENOUS

## 2016-06-18 MED ORDER — EPHEDRINE SULFATE 50 MG/ML IJ SOLN
INTRAMUSCULAR | Status: DC | PRN
  Administered 2016-06-18: 15 mg via INTRAVENOUS

## 2016-06-18 MED ORDER — HYDRALAZINE HCL 20 MG/ML IJ SOLN
INTRAMUSCULAR | Status: DC | PRN
  Administered 2016-06-18: 5 mg via INTRAVENOUS
  Administered 2016-06-18: 2.5 mg via INTRAVENOUS

## 2016-06-18 MED ORDER — HYDRALAZINE HCL 20 MG/ML IJ SOLN
INTRAMUSCULAR | Status: AC
  Filled 2016-06-18: qty 1

## 2016-06-18 MED ORDER — POLYETHYLENE GLYCOL 3350 17 G PO PACK: 17.0000 g | PACK | Freq: Every day | ORAL | Status: DC | PRN

## 2016-06-18 MED ORDER — PHENOL 1.4 % MT LIQD: 1.0000 | OROMUCOSAL | Status: DC | PRN

## 2016-06-18 MED ORDER — PROPOFOL 10 MG/ML IV BOLUS
INTRAVENOUS | Status: DC | PRN
Start: 2016-06-18 — End: 2016-06-18
  Administered 2016-06-18: 50 mg via INTRAVENOUS
  Administered 2016-06-18: 40 mg via INTRAVENOUS
  Administered 2016-06-18: 50 mg via INTRAVENOUS

## 2016-06-18 MED ORDER — ACETAMINOPHEN 325 MG PO TABS
650.0000 mg | ORAL_TABLET | Freq: Four times a day (QID) | ORAL | Status: DC | PRN
  Administered 2016-06-20: 650 mg via ORAL
  Filled 2016-06-18: qty 2

## 2016-06-18 MED ORDER — PHENYLEPHRINE HCL 10 MG/ML IJ SOLN
INTRAMUSCULAR | Status: DC | PRN
  Administered 2016-06-18: 80 ug via INTRAVENOUS

## 2016-06-18 MED ORDER — MORPHINE SULFATE (PF) 4 MG/ML IV SOLN
1.0000 mg | INTRAVENOUS | Status: DC | PRN
Start: 2016-06-18 — End: 2016-06-20
  Administered 2016-06-18: 17:00:00 1 mg via INTRAVENOUS
  Filled 2016-06-18: qty 1

## 2016-06-18 SURGICAL SUPPLY — 35 items
BAG DECANTER FOR FLEXI CONT (MISCELLANEOUS) ×2 IMPLANT
BAG ZIPLOCK 12X15 (MISCELLANEOUS) IMPLANT
BLADE SAG 18X100X1.27 (BLADE) ×2 IMPLANT
CAPT HIP TOTAL 2 ×2 IMPLANT
CLOTH BEACON ORANGE TIMEOUT ST (SAFETY) ×2 IMPLANT
COVER PERINEAL POST (MISCELLANEOUS) ×2 IMPLANT
COVER SURGICAL LIGHT HANDLE (MISCELLANEOUS) ×2 IMPLANT
DECANTER SPIKE VIAL GLASS SM (MISCELLANEOUS) ×2 IMPLANT
DRAPE STERI IOBAN 125X83 (DRAPES) ×2 IMPLANT
DRAPE U-SHAPE 47X51 STRL (DRAPES) ×4 IMPLANT
DRSG ADAPTIC 3X8 NADH LF (GAUZE/BANDAGES/DRESSINGS) ×2 IMPLANT
DRSG MEPILEX BORDER 4X4 (GAUZE/BANDAGES/DRESSINGS) ×2 IMPLANT
DRSG MEPILEX BORDER 4X8 (GAUZE/BANDAGES/DRESSINGS) ×2 IMPLANT
DURAPREP 26ML APPLICATOR (WOUND CARE) ×2 IMPLANT
ELECT REM PT RETURN 15FT ADLT (MISCELLANEOUS) ×2 IMPLANT
EVACUATOR 1/8 PVC DRAIN (DRAIN) ×2 IMPLANT
GLOVE BIO SURGEON STRL SZ7.5 (GLOVE) ×2 IMPLANT
GLOVE BIO SURGEON STRL SZ8 (GLOVE) ×4 IMPLANT
GLOVE BIOGEL PI IND STRL 7.5 (GLOVE) ×4 IMPLANT
GLOVE BIOGEL PI IND STRL 8 (GLOVE) ×2 IMPLANT
GLOVE BIOGEL PI INDICATOR 7.5 (GLOVE) ×4
GLOVE BIOGEL PI INDICATOR 8 (GLOVE) ×2
GOWN STRL REUS W/TWL LRG LVL3 (GOWN DISPOSABLE) ×2 IMPLANT
GOWN STRL REUS W/TWL XL LVL3 (GOWN DISPOSABLE) ×2 IMPLANT
PACK ANTERIOR HIP CUSTOM (KITS) ×2 IMPLANT
STRIP CLOSURE SKIN 1/2X4 (GAUZE/BANDAGES/DRESSINGS) ×2 IMPLANT
SUT ETHIBOND NAB CT1 #1 30IN (SUTURE) ×2 IMPLANT
SUT MNCRL AB 4-0 PS2 18 (SUTURE) ×2 IMPLANT
SUT STRATAFIX 0 PDS 27 VIOLET (SUTURE) ×2
SUT VIC AB 2-0 CT1 27 (SUTURE) ×3
SUT VIC AB 2-0 CT1 TAPERPNT 27 (SUTURE) ×3 IMPLANT
SUTURE STRATFX 0 PDS 27 VIOLET (SUTURE) ×1 IMPLANT
SYR 50ML LL SCALE MARK (SYRINGE) IMPLANT
TRAY FOLEY CATH 14FR (SET/KITS/TRAYS/PACK) ×2 IMPLANT
YANKAUER SUCT BULB TIP 10FT TU (MISCELLANEOUS) ×2 IMPLANT

## 2016-06-18 NOTE — Anesthesia Postprocedure Evaluation (Signed)
Anesthesia Post Note  Patient: Martha Richard  Procedure(s) Performed: Procedure(s) (LRB): LEFT TOTAL HIP ARTHROPLASTY ANTERIOR APPROACH (Left)  Patient location during evaluation: PACU Anesthesia Type: General Level of consciousness: awake and alert and patient cooperative Pain management: pain level controlled Vital Signs Assessment: post-procedure vital signs reviewed and stable Respiratory status: spontaneous breathing and respiratory function stable Cardiovascular status: stable Anesthetic complications: no       Last Vitals:  Vitals:   06/18/16 1706 06/18/16 1807  BP: (!) 142/51 (!) 125/49  Pulse: 76 70  Resp: 18 17  Temp: 36.4 C 36.7 C    Last Pain:  Vitals:   06/18/16 1807  TempSrc: Axillary  PainSc:                  Braman S

## 2016-06-18 NOTE — H&P (View-Only) (Signed)
Martha Richard DOB: 08/26/1929 Married / Language: English / Race: White Female Date of Admission:  06/18/2016 CC:  Left Hip Pain History of Present Illness  The patient is a 83 year old female who comes in for a preoperative History and Physical. The patient is scheduled for a left total hip arthroplasty (anterior) to be performed by Dr. Dione Plover. Aluisio, Martha Richard at [facility] on 06-18-2016. The patient is a 83 year old female who presented with a hip problem. The patient reports left hip problems including pain symptoms that have been present for over 6 month(s). The symptoms began without any known injury. Symptoms reported include hip pain The patient reports symptoms radiating to the: left groin and left thigh. The patient describes the hip problem as sharp, dull and aching. Onset of symptoms was with symptoms now occuring constantly (worse with walking).The patient feels as if their symptoms are does feel they are worsening. She has had pain in the left anterior groin for over six months. It came on without trauma. She has had some treatment for this hip three or four years ago where she had an injection into her hip, but it was not done under x-ray or ultrasound guidance and she states it did not help at all. Here recently, she has been having more discomfort in the left groin. It radiated into the anterior left thigh. It is worse with weightbearing and better when she is off it. X-rays show advanced osteoarthritis in the left hip with bone on bone superior weightbearing dome finding, cystic degenerative change in both the acetabular and up femoral head side. She is 87, but overall in good health. She is going to become severely debilitated if we do not get this fixed. At this point, the most predictive means of improving her pain and function is going be total hip arthroplasty. We discussed in detail and she wants to proceed. They have been treated conservatively in the past for the above  stated problem and despite conservative measures, they continue to have progressive pain and severe functional limitations and dysfunction. They have failed non-operative management including home exercise, medications, and injections. It is felt that they would benefit from undergoing total joint replacement. Risks and benefits of the procedure have been discussed with the patient and they elect to proceed with surgery. There are no active contraindications to surgery such as ongoing infection or rapidly progressive neurological disease. Her surgery was originally scheduled for 05/21/2016 but had to be cancelled due to new onset of a bundle branch block at her pre-op. She has been seen recently by her medical physician and also sent to a cardiologist prior to surgery.   Problem List/Past Medical  Impaired fasting glucose (R73.01)  Pain of left hip joint (M25.552)  Primary osteoarthritis of left hip (M16.12)  Aortic stenosis (I35.0)  High blood pressure  Skin Cancer  Squamous Cell Hypercholesterolemia  Shingles  Cataract  Hemorrhoids  Bursitis  Measles  Mumps  Orthostatic Hypotension  Gastroesophageal Reflux Disease   Allergies Peanut PRODUCTS Aspirin gastric burning and bleeding Penicillins - Rash, Itching. ACE Inhibitors - Cough. Atenolol *BETA BLOCKERS - Bradycardia  Family History Cancer  Father. Cerebrovascular Accident  Father. Diabetes Mellitus  Brother, Maternal Grandfather, Maternal Grandmother. First Degree Relatives  reported Heart Disease  Mother. Heart disease in female family member before age 38  Hypertension  Mother. Osteoarthritis  Mother.  Social History Children  2 Current work status  retired Furniture conservator/restorer rarely Living situation  live with spouse Marital status  married Never consumed alcohol  03/27/2016: Never consumed alcohol No history of drug/alcohol rehab  Not under pain contract  Number of flights of stairs  before winded  less than 1 Tobacco / smoke exposure  03/27/2016: no Tobacco use  Former smoker. 03/27/2016: smoke(d) less than 1/2 pack(s) per day Advance Directives  Living Will  Medication History Chlorthalidone (25MG  Tablet, Oral) Active.  Past Surgical History Breast Biopsy  Date: 07/1971. left Breast Mass; Local Excision  left Appendectomy  Date: 1948. Benign Fibroadenoma  around 1955  Review of Systems General Not Present- Chills, Fatigue, Fever, Memory Loss, Night Sweats, Weight Gain and Weight Loss. Skin Not Present- Eczema, Hives, Itching, Lesions and Rash. HEENT Not Present- Dentures, Double Vision, Headache, Hearing Loss, Tinnitus and Visual Loss. Respiratory Present- Shortness of breath with exertion. Not Present- Allergies, Chronic Cough, Coughing up blood and Shortness of breath at rest. Cardiovascular Not Present- Chest Pain, Difficulty Breathing Lying Down, Murmur, Palpitations, Racing/skipping heartbeats and Swelling. Gastrointestinal Not Present- Abdominal Pain, Bloody Stool, Constipation, Diarrhea, Difficulty Swallowing, Heartburn, Jaundice, Loss of appetitie, Nausea and Vomiting. Female Genitourinary Not Present- Blood in Urine, Discharge, Flank Pain, Incontinence, Painful Urination, Urgency, Urinary frequency, Urinary Retention, Urinating at Night and Weak urinary stream. Musculoskeletal Present- Joint Pain. Not Present- Back Pain, Joint Swelling, Morning Stiffness, Muscle Pain, Muscle Weakness and Spasms. Neurological Not Present- Blackout spells, Difficulty with balance, Dizziness, Paralysis, Tremor and Weakness. Psychiatric Not Present- Insomnia.  Vitals  Weight: 162 lb Height: 63in Weight was reported by patient. Height was reported by patient. Body Surface Area: 1.77 m Body Mass Index: 28.7 kg/m  Pulse: 60 (Regular)  BP: 146/78 (Sitting, Right Arm, Standard)  Physical Exam General Mental Status -Alert, cooperative and good  historian. General Appearance-pleasant, Not in acute distress. Orientation-Oriented X3. Build & Nutrition-Well nourished and Well developed.  Head and Neck Head-normocephalic, atraumatic . Neck Global Assessment - supple, no bruit auscultated on the right, no bruit auscultated on the left.  Eye Vision-Wears corrective lenses. Pupil - Bilateral-Regular and Round. Motion - Bilateral-EOMI.  ENMT Note: partial upper and lower dentures bilateral hearing aids  Chest and Lung Exam Auscultation Breath sounds - clear at anterior chest wall and clear at posterior chest wall. Adventitious sounds - No Adventitious sounds.  Cardiovascular Auscultation Rhythm - Regular rate and rhythm. Heart Sounds - S1 WNL and S2 WNL. Murmurs & Other Heart Sounds - Auscultation of the heart reveals - No Murmurs.  Abdomen Palpation/Percussion Tenderness - Abdomen is non-tender to palpation. Rigidity (guarding) - Abdomen is soft. Auscultation Auscultation of the abdomen reveals - Bowel sounds normal.  Female Genitourinary Note: Not done, not pertinent to present illness  Musculoskeletal Note: She is in no distress. Her left hip can be flexed to 90. No internal rotation, about 10 to 20 of external rotation, 20 of abduction. Right hip range of motion is normal.  Assessment & Plan Primary osteoarthritis of left hip (M16.12)  Note:Surgical Plans: Left Total Hip Replacement - Anterior Approach  Disposition: Home with Husband  PCP: Dr. Quintin Alto Cards: Dr. Raliegh Ip in [place]  IV TXA  Anesthesia Issues: None but quite sensitive to medications  Patient was instructed on what medications to stop prior to surgery.  Signed electronically by Joelene Millin, III PA-C

## 2016-06-18 NOTE — Interval H&P Note (Signed)
History and Physical Interval Note:  06/18/2016 12:50 PM  Martha Richard  has presented today for surgery, with the diagnosis of Osteoarthritis Left Hip  The various methods of treatment have been discussed with the patient and family. After consideration of risks, benefits and other options for treatment, the patient has consented to  Procedure(s): LEFT TOTAL HIP ARTHROPLASTY ANTERIOR APPROACH (Left) as a surgical intervention .  The patient's history has been reviewed, patient examined, no change in status, stable for surgery.  I have reviewed the patient's chart and labs.  Questions were answered to the patient's satisfaction.     Gearlean Alf

## 2016-06-18 NOTE — Anesthesia Procedure Notes (Signed)
Procedure Name: Intubation Date/Time: 06/18/2016 1:58 PM Performed by: Claudia Desanctis Pre-anesthesia Checklist: Patient identified, Emergency Drugs available, Suction available and Patient being monitored Patient Re-evaluated:Patient Re-evaluated prior to inductionOxygen Delivery Method: Circle system utilized Preoxygenation: Pre-oxygenation with 100% oxygen Intubation Type: IV induction Ventilation: Mask ventilation without difficulty Laryngoscope Size: 2 and Miller Grade View: Grade I Tube type: Oral Number of attempts: 1 Airway Equipment and Method: Stylet Placement Confirmation: ETT inserted through vocal cords under direct vision,  positive ETCO2 and breath sounds checked- equal and bilateral Secured at: 21 cm Tube secured with: Tape Dental Injury: Teeth and Oropharynx as per pre-operative assessment

## 2016-06-18 NOTE — Op Note (Signed)
OPERATIVE REPORT- TOTAL HIP ARTHROPLASTY   PREOPERATIVE DIAGNOSIS: Osteoarthritis of the Left hip.   POSTOPERATIVE DIAGNOSIS: Osteoarthritis of the Left  hip.   PROCEDURE: Left total hip arthroplasty, anterior approach.   SURGEON: Gaynelle Arabian, MD   ASSISTANT: Arlee Muslim, PA-C  ANESTHESIA:  General  ESTIMATED BLOOD LOSS:-450 ml    DRAINS: Hemovac x1.   COMPLICATIONS: None   CONDITION: PACU - hemodynamically stable.   BRIEF CLINICAL NOTE: Martha Richard is a 83 y.o. female who has advanced end-  stage arthritis of their Left  hip with progressively worsening pain and  dysfunction.The patient has failed nonoperative management and presents for  total hip arthroplasty.   PROCEDURE IN DETAIL: After successful administration of spinal  anesthetic, the traction boots for the [facility] bed were placed on both  feet and the patient was placed onto the [facility] bed, boots placed into the leg  holders. The Left hip was then isolated from the perineum with plastic  drapes and prepped and draped in the usual sterile fashion. ASIS and  greater trochanter were marked and a oblique incision was made, starting  at about 1 cm lateral and 2 cm distal to the ASIS and coursing towards  the anterior cortex of the femur. The skin was cut with a 10 blade  through subcutaneous tissue to the level of the fascia overlying the  tensor fascia lata muscle. The fascia was then incised in line with the  incision at the junction of the anterior third and posterior 2/3rd. The  muscle was teased off the fascia and then the interval between the TFL  and the rectus was developed. The Hohmann retractor was then placed at  the top of the femoral neck over the capsule. The vessels overlying the  capsule were cauterized and the fat on top of the capsule was removed.  A Hohmann retractor was then placed anterior underneath the rectus  femoris to give exposure to the entire anterior capsule. A T-shaped   capsulotomy was performed. The edges were tagged and the femoral head  was identified.       Osteophytes are removed off the superior acetabulum.  The femoral neck was then cut in situ with an oscillating saw. Traction  was then applied to the left lower extremity utilizing the [facility]  traction. The femoral head was then removed. Retractors were placed  around the acetabulum and then circumferential removal of the labrum was  performed. Osteophytes were also removed. Reaming starts at 45 mm to  medialize and  Increased in 2 mm increments to 49 mm. We reamed in  approximately 40 degrees of abduction, 20 degrees anteversion. A 50 mm  pinnacle acetabular shell was then impacted in anatomic position under  fluoroscopic guidance with excellent purchase. We did not need to place  any additional dome screws. A 32 mm neutral + 4 marathon liner was then  placed into the acetabular shell.       The femoral lift was then placed along the lateral aspect of the femur  just distal to the vastus ridge. The leg was  externally rotated and capsule  was stripped off the inferior aspect of the femoral neck down to the  level of the lesser trochanter, this was done with electrocautery. The femur was lifted after this was performed. The  leg was then placed in an extended and adducted position essentially delivering the femur. We also removed the capsule superiorly and the piriformis from the piriformis  fossa to gain excellent exposure of the  proximal femur. Rongeur was used to remove some cancellous bone to get  into the lateral portion of the proximal femur for placement of the  initial starter reamer. The starter broaches was placed  the starter broach  and was shown to go down the center of the canal. Broaching  with the  Corail system was then performed starting at size 8, coursing  Up to size 11. A size 11 had excellent torsional and rotational  and axial stability. The trial high offset neck was then  placed  with a 32 + 1 trial head. The hip was then reduced. We confirmed that  the stem was in the canal both on AP and lateral x-rays. It also has excellent sizing. The hip was reduced with outstanding stability through full extension and full external rotation.. AP pelvis was taken and the leg lengths were measured and found to be equal. Hip was then dislocated again and the femoral head and neck removed. The  femoral broach was removed. Size 11 Corail stem with a high offset  neck was then impacted into the femur following native anteversion. Has  excellent purchase in the canal. Excellent torsional and rotational and  axial stability. It is confirmed to be in the canal on AP and lateral  fluoroscopic views. The 32 + 1 ceramic head was placed and the hip  reduced with outstanding stability. Again AP pelvis was taken and it  confirmed that the leg lengths were equal. The wound was then copiously  irrigated with saline solution and the capsule reattached and repaired  with Ethibond suture. 30 ml of .25% Bupivicaine was  injected into the capsule and into the edge of the tensor fascia lata as well as subcutaneous tissue. The fascia overlying the tensor fascia lata was then closed with a running #1 V-Loc. Subcu was closed with interrupted 2-0 Vicryl and subcuticular running 4-0 Monocryl. Incision was cleaned  and dried. Steri-Strips and a bulky sterile dressing applied. Hemovac  drain was hooked to suction and then the patient was awakened and transported to  recovery in stable condition.        Please note that a surgical assistant was a medical necessity for this procedure to perform it in a safe and expeditious manner. Assistant was necessary to provide appropriate retraction of vital neurovascular structures and to prevent femoral fracture and allow for anatomic placement of the prosthesis.  Gaynelle Arabian, M.D.

## 2016-06-18 NOTE — Transfer of Care (Signed)
Immediate Anesthesia Transfer of Care Note  Patient: Martha Richard  Procedure(s) Performed: Procedure(s): LEFT TOTAL HIP ARTHROPLASTY ANTERIOR APPROACH (Left)  Patient Location: PACU  Anesthesia Type:General  Level of Consciousness:  sedated, patient cooperative and responds to stimulation  Airway & Oxygen Therapy:Patient Spontanous Breathing and Patient connected to face mask oxgen  Post-op Assessment:  Report given to PACU RN and Post -op Vital signs reviewed and stable  Post vital signs:  Reviewed and stable  Last Vitals:  Vitals:   06/18/16 1200  BP: (!) 161/78  Pulse: 81  Resp: 16  Temp: 68.0 C    Complications: No apparent anesthesia complications

## 2016-06-18 NOTE — Anesthesia Preprocedure Evaluation (Signed)
Anesthesia Evaluation  Patient identified by MRN, date of birth, ID band Patient awake    Reviewed: Allergy & Precautions, H&P , NPO status , Patient's Chart, lab work & pertinent test results  Airway Mallampati: II   Neck ROM: full    Dental   Pulmonary former smoker,    breath sounds clear to auscultation       Cardiovascular hypertension, + Valvular Problems/Murmurs AS  Rhythm:regular Rate:Normal  Cleared by cardiologist.  Negative stress test.  TTE showed good LV function.  Mild to moderate Aortic stenosis.    - Aortic valve: Severely calcified annulus. Trileaflet; severely   thickened leaflets. There was mild to moderate stenosis. There   was mild regurgitation. Mean gradient (S): 15 mm Hg. Valve area   (VTI): 1.32 cm^2   Neuro/Psych  Headaches,    GI/Hepatic   Endo/Other    Renal/GU      Musculoskeletal  (+) Arthritis ,   Abdominal   Peds  Hematology   Anesthesia Other Findings   Reproductive/Obstetrics                             Anesthesia Physical Anesthesia Plan  ASA: III  Anesthesia Plan: General   Post-op Pain Management:    Induction: Intravenous  Airway Management Planned: Oral ETT  Additional Equipment:   Intra-op Plan:   Post-operative Plan: Extubation in OR  Informed Consent: I have reviewed the patients History and Physical, chart, labs and discussed the procedure including the risks, benefits and alternatives for the proposed anesthesia with the patient or authorized representative who has indicated his/her understanding and acceptance.     Plan Discussed with: CRNA, Surgeon and Anesthesiologist  Anesthesia Plan Comments:         Anesthesia Quick Evaluation

## 2016-06-19 ENCOUNTER — Encounter (HOSPITAL_COMMUNITY): Payer: Self-pay | Admitting: Orthopedic Surgery

## 2016-06-19 ENCOUNTER — Ambulatory Visit: Payer: Medicare Other | Admitting: Cardiovascular Disease

## 2016-06-19 LAB — BASIC METABOLIC PANEL
Anion gap: 6 (ref 5–15)
BUN: 16 mg/dL (ref 6–20)
CO2: 25 mmol/L (ref 22–32)
Calcium: 8.9 mg/dL (ref 8.9–10.3)
Chloride: 106 mmol/L (ref 101–111)
Creatinine, Ser: 0.65 mg/dL (ref 0.44–1.00)
GFR calc Af Amer: >60 mL/min (ref >60)
GFR calc non Af Amer: >60 mL/min (ref >60)
Glucose, Bld: 138 mg/dL — ABNORMAL HIGH (ref 65–99)
Potassium: 4.2 mmol/L (ref 3.5–5.1)
Sodium: 137 mmol/L (ref 135–145)

## 2016-06-19 LAB — CBC
HCT: 34.1 % — ABNORMAL LOW (ref 36.0–46.0)
Hemoglobin: 11.1 g/dL — ABNORMAL LOW (ref 12.0–15.0)
MCH: 28.3 pg (ref 26.0–34.0)
MCHC: 32.6 g/dL (ref 30.0–36.0)
MCV: 87 fL (ref 78.0–100.0)
Platelets: 232 10*3/uL (ref 150–400)
RBC: 3.92 MIL/uL (ref 3.87–5.11)
RDW: 13.8 % (ref 11.5–15.5)
WBC: 12.5 10*3/uL — ABNORMAL HIGH (ref 4.0–10.5)

## 2016-06-19 MED ORDER — PANTOPRAZOLE SODIUM 40 MG PO TBEC: 40.0000 mg | DELAYED_RELEASE_TABLET | Freq: Two times a day (BID) | ORAL | Status: DC | PRN

## 2016-06-19 MED ORDER — CALCIUM CARBONATE ANTACID 500 MG PO CHEW
1.0000 | CHEWABLE_TABLET | Freq: Three times a day (TID) | ORAL | Status: DC
  Filled 2016-06-19: qty 2

## 2016-06-19 NOTE — Evaluation (Signed)
Occupational Therapy Evaluation Patient Details Name: Martha Richard MRN: [id number] DOB: [birth year] Today's Date: 06/19/2016    History of Present Illness s/p L DA THA   Clinical Impression   This 83 year old female was admitted for the above sx. At baseline, she is independent with adls.  She currently needs min guard for mobility and min to mod A for LB adls. Will further educate on AE and tub DME options.  Goals are for supervision level in acute    Follow Up Recommendations  Supervision/Assistance - 24 hour    Equipment Recommendations  None recommended by OT    Recommendations for Other Services       Precautions / Restrictions Precautions Precautions: Fall Restrictions Weight Bearing Restrictions: No      Mobility Bed Mobility               General bed mobility comments: oob  Transfers Overall transfer level: Needs assistance Equipment used: Rolling walker (2 wheeled) Transfers: Sit to/from Stand Sit to Stand: Min guard         General transfer comment: for safety.  Pt cued herself for UE placement    Balance                                           ADL either performed or assessed with clinical judgement   ADL Overall ADL's : Needs assistance/impaired     Grooming: Wash/dry hands;Wash/dry face;Set up;Sitting   Upper Body Bathing: Set up;Standing   Lower Body Bathing: Minimal assistance;Sit to/from stand   Upper Body Dressing : Set up;Sitting   Lower Body Dressing: Moderate assistance;Sit to/from stand   Toilet Transfer: Min guard;Ambulation;BSC;RW             General ADL Comments: ambulated to bathroom and performed bathing and toilet transfer. Pt has a tub seat.  She talked about holding onto a towel rack; educated not to do this--recommended holding to wall and sidestepping over when ready.  Pt verbalizes understanding     Vision         Perception     Praxis      Pertinent Vitals/Pain Pain  Assessment: Faces Faces Pain Scale: Hurts a little bit Pain Location: L hip Pain Descriptors / Indicators: Sore Pain Intervention(s): Limited activity within patient's tolerance;Monitored during session;Premedicated before session;Repositioned;Ice applied     Hand Dominance     Extremity/Trunk Assessment Upper Extremity Assessment Upper Extremity Assessment: Overall WFL for tasks assessed           Communication Communication Communication: No difficulties   Cognition Arousal/Alertness: Awake/alert Behavior During Therapy: WFL for tasks assessed/performed Overall Cognitive Status: Within Functional Limits for tasks assessed                                     General Comments       Exercises     Shoulder Instructions      Home Living Family/patient expects to be discharged to:: Private residence Living Arrangements: Spouse/significant other Available Help at Discharge: Family               Bathroom Shower/Tub: Teacher, early years/pre: Standard     Home Equipment: Toilet riser;Shower seat   Additional Comments: grand niece is coming for a week.  Pt  has a reacher at home;  plans to get a long sponge or brush      Prior Functioning/Environment Level of Independence: Independent                 OT Problem List: Decreased activity tolerance;Pain;Decreased knowledge of use of DME or AE      OT Treatment/Interventions: Self-care/ADL training;DME and/or AE instruction;Patient/family education    OT Goals(Current goals can be found in the care plan section) Acute Rehab OT Goals Patient Stated Goal: return to independence OT Goal Formulation: With patient Time For Goal Achievement: 06/21/16 Potential to Achieve Goals: Good ADL Goals Pt Will Perform Lower Body Dressing: with supervision;sit to/from stand;with adaptive equipment (pants with reacher) Pt Will Transfer to Toilet: with supervision;ambulating (high commode  (ETS)) Additional ADL Goal #1: pt will verbalize tub bench vs sponge bathing initially   OT Frequency: Min 2X/week   Barriers to D/C:            Co-evaluation              End of Session    Activity Tolerance: Patient tolerated treatment well Patient left: in chair;with call bell/phone within reach;with family/visitor present  OT Visit Diagnosis: Pain Pain - Right/Left: Left Pain - part of body: Hip                Time: 7482-7078 OT Time Calculation (min): 27 min Charges:  OT General Charges $OT Visit: 1 Procedure OT Evaluation $OT Eval Low Complexity: 1 Procedure OT Treatments $Self Care/Home Management : 8-22 mins G-Codes:     Lesle Chris, OTR/L [phone] 06/19/2016  Martha Richard 06/19/2016, 12:34 PM

## 2016-06-19 NOTE — Progress Notes (Signed)
Physical Therapy Treatment Patient Details Name: Martha Richard MRN: [id number] DOB: [birth year] Today's Date: 06/19/2016    History of Present Illness s/p L DA THA    PT Comments    Pt motivated and progressing well with mobility but continues mildly impulsive requiring cues for safety.   Follow Up Recommendations  Home health PT     Equipment Recommendations  Rolling walker with 5" wheels    Recommendations for Other Services OT consult     Precautions / Restrictions Precautions Precautions: Fall Restrictions Weight Bearing Restrictions: No    Mobility  Bed Mobility Overal bed mobility: Needs Assistance Bed Mobility: Supine to Sit     Supine to sit: Min assist;Mod assist     General bed mobility comments: Pt up in chair and declines back to bed  Transfers Overall transfer level: Needs assistance Equipment used: Rolling walker (2 wheeled) Transfers: Sit to/from Stand Sit to Stand: Min guard         General transfer comment: cues for LE management and use of UEs to self assist  Ambulation/Gait Ambulation/Gait assistance: Min assist;Min guard Ambulation Distance (Feet): 222 Feet Assistive device: Rolling walker (2 wheeled) Gait Pattern/deviations: Step-to pattern;Step-through pattern;Decreased step length - right;Decreased step length - left;Shuffle;Trunk flexed Gait velocity: decr Gait velocity interpretation: Below normal speed for age/gender General Gait Details: cues for posture, position from RW and initial sequence   Stairs            Wheelchair Mobility    Modified Rankin (Stroke Patients Only)       Balance                                            Cognition Arousal/Alertness: Awake/alert Behavior During Therapy: WFL for tasks assessed/performed Overall Cognitive Status: Within Functional Limits for tasks assessed                                        Exercises Total Joint  Exercises Ankle Circles/Pumps: AROM;Both;20 reps;Supine Quad Sets: AROM;Left;10 reps;Supine Heel Slides: AAROM;Left;Supine;20 reps Hip ABduction/ADduction: AAROM;Left;15 reps;Supine    General Comments        Pertinent Vitals/Pain Pain Assessment: 0-10 Pain Score: 4  Faces Pain Scale: Hurts a little bit Pain Location: L hip Pain Descriptors / Indicators: Sore;Burning Pain Intervention(s): Limited activity within patient's tolerance;Monitored during session;Premedicated before session    Home Living Family/patient expects to be discharged to:: Private residence Living Arrangements: Spouse/significant other Available Help at Discharge: Family Type of Home: House Home Access: Stairs to enter   Home Layout: Able to live on main level with bedroom/bathroom Home Equipment: Toilet riser;Shower seat Additional Comments: grand niece is coming for a week.  Pt has a reacher at home;  plans to get a long sponge or brush    Prior Function Level of Independence: Independent          PT Goals (current goals can now be found in the care plan section) Acute Rehab PT Goals Patient Stated Goal: return to independence PT Goal Formulation: With patient Time For Goal Achievement: 06/23/16 Potential to Achieve Goals: Good Progress towards PT goals: Progressing toward goals    Frequency    7X/week      PT Plan Current plan remains appropriate    Co-evaluation  End of Session Equipment Utilized During Treatment: Gait belt Activity Tolerance: Patient tolerated treatment well Patient left: in chair;with call bell/phone within reach;with family/visitor present Nurse Communication: Mobility status PT Visit Diagnosis: Unsteadiness on feet (R26.81);Difficulty in walking, not elsewhere classified (R26.2)     Time: 3710-6269 PT Time Calculation (min) (ACUTE ONLY): 21 min  Charges:  $Gait Training: 8-22 mins $Therapeutic Exercise: 8-22 mins                    G  Codes:       Pg [phone]    Sou Nohr 06/19/2016, 3:01 PM

## 2016-06-19 NOTE — Discharge Instructions (Addendum)
° °Dr. Frank Aluisio °Total Joint Specialist °[facility] °[address] °[place], [place] [address] °[phone] ° °ANTERIOR APPROACH TOTAL HIP REPLACEMENT POSTOPERATIVE DIRECTIONS ° ° °Hip Rehabilitation, Guidelines Following Surgery  °The results of a hip operation are greatly improved after range of motion and muscle strengthening exercises. Follow all safety measures which are given to protect your hip. If any of these exercises cause increased pain or swelling in your joint, decrease the amount until you are comfortable again. Then slowly increase the exercises. Call your caregiver if you have problems or questions.  ° °HOME CARE INSTRUCTIONS  °Remove items at home which could result in a fall. This includes throw rugs or furniture in walking pathways.  °· ICE to the affected hip every three hours for 30 minutes at a time and then as needed for pain and swelling.  Continue to use ice on the hip for pain and swelling from surgery. You may notice swelling that will progress down to the foot and ankle.  This is normal after surgery.  Elevate the leg when you are not up walking on it.   °· Continue to use the breathing machine which will help keep your temperature down.  It is common for your temperature to cycle up and down following surgery, especially at night when you are not up moving around and exerting yourself.  The breathing machine keeps your lungs expanded and your temperature down. ° ° °DIET °You may resume your previous home diet once your are discharged from the hospital. ° °DRESSING / WOUND CARE / SHOWERING °You may shower 3 days after surgery, but keep the wounds dry during showering.  You may use an occlusive plastic wrap (Press'n Seal for example), NO SOAKING/SUBMERGING IN THE BATHTUB.  If the bandage gets wet, change with a clean dry gauze.  If the incision gets wet, pat the wound dry with a clean towel. °You may start showering once you are discharged home but do not  submerge the incision under water. Just pat the incision dry and apply a dry gauze dressing on daily. °Change the surgical dressing daily and reapply a dry dressing each time. ° °ACTIVITY °Walk with your walker as instructed. °Use walker as long as suggested by your caregivers. °Avoid periods of inactivity such as sitting longer than an hour when not asleep. This helps prevent blood clots.  °You may resume a sexual relationship in one month or when given the OK by your doctor.  °You may return to work once you are cleared by your doctor.  °Do not drive a car for 6 weeks or until released by you surgeon.  °Do not drive while taking narcotics. ° °WEIGHT BEARING °Weight bearing as tolerated with assist device (walker, cane, etc) as directed, use it as long as suggested by your surgeon or therapist, typically at least 4-6 weeks. ° °POSTOPERATIVE CONSTIPATION PROTOCOL °Constipation - defined medically as fewer than three stools per week and severe constipation as less than one stool per week. ° °One of the most common issues patients have following surgery is constipation.  Even if you have a regular bowel pattern at home, your normal regimen is likely to be disrupted due to multiple reasons following surgery.  Combination of anesthesia, postoperative narcotics, change in appetite and fluid intake all can affect your bowels.  In order to avoid complications following surgery, here are some recommendations in order to help you during your recovery period. ° °Colace (docusate) - Pick up an over-the-counter   form of Colace or another stool softener and take twice a day as long as you are requiring postoperative pain medications.  Take with a full glass of water daily.  If you experience loose stools or diarrhea, hold the colace until you stool forms back up.  If your symptoms do not get better within 1 week or if they get worse, check with your doctor. ° °Dulcolax (bisacodyl) - Pick up over-the-counter and take as directed  by the product packaging as needed to assist with the movement of your bowels.  Take with a full glass of water.  Use this product as needed if not relieved by Colace only.  ° °MiraLax (polyethylene glycol) - Pick up over-the-counter to have on hand.  MiraLax is a solution that will increase the amount of water in your bowels to assist with bowel movements.  Take as directed and can mix with a glass of water, juice, soda, coffee, or tea.  Take if you go more than two days without a movement. °Do not use MiraLax more than once per day. Call your doctor if you are still constipated or irregular after using this medication for 7 days in a row. ° °If you continue to have problems with postoperative constipation, please contact the office for further assistance and recommendations.  If you experience "the worst abdominal pain ever" or develop nausea or vomiting, please contact the office immediatly for further recommendations for treatment. ° °ITCHING ° If you experience itching with your medications, try taking only a single pain pill, or even half a pain pill at a time.  You can also use Benadryl over the counter for itching or also to help with sleep.  ° °TED HOSE STOCKINGS °Wear the elastic stockings on both legs for three weeks following surgery during the day but you may remove then at night for sleeping. ° °MEDICATIONS °See your medication summary on the “After Visit Summary” that the nursing staff will review with you prior to discharge.  You may have some home medications which will be placed on hold until you complete the course of blood thinner medication.  It is important for you to complete the blood thinner medication as prescribed by your surgeon.  Continue your approved medications as instructed at time of discharge. ° °PRECAUTIONS °If you experience chest pain or shortness of breath - call 911 immediately for transfer to the hospital emergency department.  °If you develop a fever greater that 101 F,  purulent drainage from wound, increased redness or drainage from wound, foul odor from the wound/dressing, or calf pain - CONTACT YOUR SURGEON.   °                                                °FOLLOW-UP APPOINTMENTS °Make sure you keep all of your appointments after your operation with your surgeon and caregivers. You should call the office at the above phone number and make an appointment for approximately two weeks after the date of your surgery or on the date instructed by your surgeon outlined in the "After Visit Summary". ° °RANGE OF MOTION AND STRENGTHENING EXERCISES  °These exercises are designed to help you keep full movement of your hip joint. Follow your caregiver's or physical therapist's instructions. Perform all exercises about fifteen times, three times per day or as directed. Exercise both hips, even if you   have had only one joint replacement. These exercises can be done on a training (exercise) mat, on the floor, on a table or on a bed. Use whatever works the best and is most comfortable for you. Use music or television while you are exercising so that the exercises are a pleasant break in your day. This will make your life better with the exercises acting as a break in routine you can look forward to.  Lying on your back, slowly slide your foot toward your buttocks, raising your knee up off the floor. Then slowly slide your foot back down until your leg is straight again.  Lying on your back spread your legs as far apart as you can without causing discomfort.  Lying on your side, raise your upper leg and foot straight up from the floor as far as is comfortable. Slowly lower the leg and repeat.  Lying on your back, tighten up the muscle in the front of your thigh (quadriceps muscles). You can do this by keeping your leg straight and trying to raise your heel off the floor. This helps strengthen the largest muscle supporting your knee.  Lying on your back, tighten up the muscles of your  buttocks both with the legs straight and with the knee bent at a comfortable angle while keeping your heel on the floor.   IF YOU ARE TRANSFERRED TO A SKILLED REHAB FACILITY If the patient is transferred to a skilled rehab facility following release from the hospital, a list of the current medications will be sent to the facility for the patient to continue.  When discharged from the skilled rehab facility, please have the facility set up the patient's [place] prior to being released. Also, the skilled facility will be responsible for providing the patient with their medications at time of release from the facility to include their pain medication, the muscle relaxants, and their blood thinner medication. If the patient is still at the rehab facility at time of the two week follow up appointment, the skilled rehab facility will also need to assist the patient in arranging follow up appointment in our office and any transportation needs.  MAKE SURE YOU:  Understand these instructions.  Get help right away if you are not doing well or get worse.    Pick up stool softner and laxative for home use following surgery while on pain medications. Do not submerge incision under water. Please use good hand washing techniques while changing dressing each day. May shower starting three days after surgery. Please use a clean towel to pat the incision dry following showers. Continue to use ice for pain and swelling after surgery. Do not use any lotions or creams on the incision until instructed by your surgeon.  Take Xarelto for two and a half more weeks following discharge from the hospital, then discontinue Xarelto. Once the patient has completed the blood thinner regimen, then take a Baby 81 mg Aspirin daily for three more weeks.    Information on my medicine - XARELTO (Rivaroxaban)  This medication education was reviewed with me or my healthcare representative as part of my  discharge preparation.  The pharmacist that spoke with me during my hospital stay was:  Eudelia Bunch, [facility]  Why was Xarelto prescribed for you? Xarelto was prescribed for you to reduce the risk of blood clots forming after orthopedic surgery. The medical term for these abnormal blood clots is venous thromboembolism (VTE).  What do you need to know  about xarelto ? Take your Xarelto ONCE DAILY at the same time every day. You may take it either with or without food.  If you have difficulty swallowing the tablet whole, you may crush it and mix in applesauce just prior to taking your dose.  Take Xarelto exactly as prescribed by your doctor and DO NOT stop taking Xarelto without talking to the doctor who prescribed the medication.  Stopping without other VTE prevention medication to take the place of Xarelto may increase your risk of developing a clot.  After discharge, you should have regular check-up appointments with your healthcare provider that is prescribing your Xarelto.    What do you do if you miss a dose? If you miss a dose, take it as soon as you remember on the same day then continue your regularly scheduled once daily regimen the next day. Do not take two doses of Xarelto on the same day.   Important Safety Information A possible side effect of Xarelto is bleeding. You should call your healthcare provider right away if you experience any of the following: ? Bleeding from an injury or your nose that does not stop. ? Unusual colored urine (red or dark brown) or unusual colored stools (red or black). ? Unusual bruising for unknown reasons. ? A serious fall or if you hit your head (even if there is no bleeding).  Some medicines may interact with Xarelto and might increase your risk of bleeding while on Xarelto. To help avoid this, consult your healthcare provider or pharmacist prior to using any new prescription or non-prescription medications, including herbals, vitamins,  non-steroidal anti-inflammatory drugs (NSAIDs) and supplements.  This website has more information on Xarelto: [email or web address]

## 2016-06-19 NOTE — Progress Notes (Signed)
Patient refusing medications until husband brings tums from home. PA Arlee Muslim notified.

## 2016-06-19 NOTE — Progress Notes (Addendum)
Discharge planning, spoke with patient and spouse at bedside. Have chosen [facility] for [facility]. Contacted [facility] for referral. Needs RW, declines 3-n-1, contacted [facility] to deliver to room.     Durable Medical Equipment        Start     Ordered   06/19/16 1426  For home use only DME Walker rolling  Once    Question:  Patient needs a walker to treat with the following condition  Answer:  S/P knee replacement   06/19/16 1426       [phone]

## 2016-06-19 NOTE — Evaluation (Signed)
Physical Therapy Evaluation Patient Details Name: Martha Richard MRN: [id number] DOB: Jul 21, 1929 Today's Date: 06/19/2016   History of Present Illness  s/p L DA THA  Clinical Impression  Pt s/p L THR and presents with decreased L LE strength/ROM and post op pain limiting functional mobility.  Pt should progress to dc home with family assist and HHPT follow up.    Follow Up Recommendations Home health PT    Equipment Recommendations  Rolling walker with 5" wheels    Recommendations for Other Services OT consult     Precautions / Restrictions Precautions Precautions: Fall Restrictions Weight Bearing Restrictions: No      Mobility  Bed Mobility Overal bed mobility: Needs Assistance Bed Mobility: Supine to Sit     Supine to sit: Min assist;Mod assist     General bed mobility comments: cues for sequence and use of R LE to self assist.  Physical assist to manage L LE and to bring trunk to upright  Transfers Overall transfer level: Needs assistance Equipment used: Rolling walker (2 wheeled) Transfers: Sit to/from Stand Sit to Stand: Min assist         General transfer comment: cues for LE management and use of UEs to self assist  Ambulation/Gait Ambulation/Gait assistance: Min assist Ambulation Distance (Feet): 90 Feet Assistive device: Rolling walker (2 wheeled) Gait Pattern/deviations: Step-to pattern;Step-through pattern;Decreased step length - right;Decreased step length - left;Shuffle;Trunk flexed Gait velocity: decr Gait velocity interpretation: Below normal speed for age/gender General Gait Details: cues for posture, position from RW and initial sequence  Stairs            Wheelchair Mobility    Modified Rankin (Stroke Patients Only)       Balance                                             Pertinent Vitals/Pain Pain Assessment: Faces Pain Score: 4  Faces Pain Scale: Hurts a little bit Pain Location: L hip Pain  Descriptors / Indicators: Sore Pain Intervention(s): Limited activity within patient's tolerance;Monitored during session;Premedicated before session;Ice applied    Home Living Family/patient expects to be discharged to:: Private residence Living Arrangements: Spouse/significant other Available Help at Discharge: Family Type of Home: House Home Access: Stairs to enter   Technical brewer of Steps: 1 Home Layout: Able to live on main level with bedroom/bathroom Home Equipment: Toilet riser;Shower seat Additional Comments: grand niece is coming for a week.  Pt has a reacher at home;  plans to get a long sponge or brush    Prior Function Level of Independence: Independent               Hand Dominance        Extremity/Trunk Assessment   Upper Extremity Assessment Upper Extremity Assessment: Overall WFL for tasks assessed    Lower Extremity Assessment Lower Extremity Assessment: LLE deficits/detail LLE Deficits / Details: Strength at L hip 2+/5 with AAROM at hip to 80 flex and 15 abd    Cervical / Trunk Assessment Cervical / Trunk Assessment: Kyphotic  Communication   Communication: No difficulties  Cognition Arousal/Alertness: Awake/alert Behavior During Therapy: WFL for tasks assessed/performed Overall Cognitive Status: Within Functional Limits for tasks assessed  General Comments      Exercises Total Joint Exercises Ankle Circles/Pumps: AROM;Both;20 reps;Supine Quad Sets: AROM;Left;10 reps;Supine Heel Slides: AAROM;Left;Supine;20 reps Hip ABduction/ADduction: AAROM;Left;15 reps;Supine   Assessment/Plan    PT Assessment Patient needs continued PT services  PT Problem List Decreased strength;Decreased range of motion;Decreased activity tolerance;Decreased mobility;Pain;Decreased knowledge of use of DME       PT Treatment Interventions DME instruction;Gait training;Stair training;Functional  mobility training;Therapeutic activities;Therapeutic exercise;Patient/family education    PT Goals (Current goals can be found in the Care Plan section)  Acute Rehab PT Goals Patient Stated Goal: return to independence PT Goal Formulation: With patient Time For Goal Achievement: 06/23/16 Potential to Achieve Goals: Good    Frequency 7X/week   Barriers to discharge        Co-evaluation               End of Session Equipment Utilized During Treatment: Gait belt Activity Tolerance: Patient tolerated treatment well Patient left: in chair;with call bell/phone within reach;with family/visitor present Nurse Communication: Mobility status PT Visit Diagnosis: Unsteadiness on feet (R26.81);Difficulty in walking, not elsewhere classified (R26.2)    Time: 3818-4037 PT Time Calculation (min) (ACUTE ONLY): 29 min   Charges:   PT Evaluation $PT Eval Low Complexity: 1 Procedure PT Treatments $Therapeutic Exercise: 8-22 mins   PT G Codes:        Pg [phone]    [name] 06/19/2016, 12:51 PM

## 2016-06-19 NOTE — Progress Notes (Signed)
   Subjective: 1 Day Post-Op Procedure(s) (LRB): LEFT TOTAL HIP ARTHROPLASTY ANTERIOR APPROACH (Left) Patient reports pain as mild and moderate.   Patient seen in rounds with Dr. Wynelle Link.  Some indigestion, asking for Tums.  Also add PRN Protonix Patient is well, but has had some minor complaints of pain in the hip , requiring pain medications We will start therapy today.  Plan is to go Home after hospital stay.  Objective: Vital signs in last 24 hours: Temp:  [96.4 F (35.8 C)-98.7 F (37.1 C)] 98.1 F (36.7 C) (04/26 0510) Pulse Rate:  [70-84] 82 (04/26 0510) Resp:  [14-21] 16 (04/26 0510) BP: (121-163)/(49-84) 143/52 (04/26 0510) SpO2:  [97 %-100 %] 99 % (04/26 0510) Weight:  [70.8 kg (156 lb)] 70.8 kg (156 lb) (04/25 1145)  Intake/Output from previous day:  Intake/Output Summary (Last 24 hours) at 06/19/16 0841 Last data filed at 06/19/16 0820  Gross per 24 hour  Intake             3095 ml  Output             2190 ml  Net              905 ml    Intake/Output this shift: Total I/O In: 360 [P.O.:360] Out: -   Labs:  Recent Labs  06/17/16 0907 06/19/16 0510  HGB 13.8 11.1*    Recent Labs  06/17/16 0907 06/19/16 0510  WBC 9.1 12.5*  RBC 4.74 3.92  HCT 41.6 34.1*  PLT 284 232    Recent Labs  06/17/16 0907 06/19/16 0510  NA 137 137  K 4.0 4.2  CL 103 106  CO2 27 25  BUN 17 16  CREATININE 0.86 0.65  GLUCOSE 103* 138*  CALCIUM 9.4 8.9    Recent Labs  06/17/16 0907  INR 0.96    EXAM General - Patient is Alert, Appropriate and Oriented Extremity - Neurovascular intact Sensation intact distally Intact pulses distally Dorsiflexion/Plantar flexion intact Dressing - dressing C/D/I Motor Function - intact, moving foot and toes well on exam.  Hemovac pulled without difficulty.  Past Medical History:  Diagnosis Date  . Arrhythmia    palpitations  . Arthritis   . Cancer (HCC)    squamous cell Left hand  . Complication of anesthesia    slow to wake up  . Headache    hx of migraines  . Hypertension     Assessment/Plan: 1 Day Post-Op Procedure(s) (LRB): LEFT TOTAL HIP ARTHROPLASTY ANTERIOR APPROACH (Left) Principal Problem:   OA (osteoarthritis) of hip  Estimated body mass index is 27.63 kg/m as calculated from the following:   Height as of this encounter: 5\' 3"  (1.6 m).   Weight as of this encounter: 70.8 kg (156 lb). Advance diet Up with therapy Plan for discharge tomorrow Discharge home with home health  DVT Prophylaxis - Xarelto Weight Bearing As Tolerated left Leg Hemovac Pulled Begin Therapy  Arlee Muslim, PA-C Orthopaedic Surgery 06/19/2016, 8:41 AM.

## 2016-06-20 LAB — CBC
HCT: 31.2 % — ABNORMAL LOW (ref 36.0–46.0)
Hemoglobin: 10.4 g/dL — ABNORMAL LOW (ref 12.0–15.0)
MCH: 29.8 pg (ref 26.0–34.0)
MCHC: 33.3 g/dL (ref 30.0–36.0)
MCV: 89.4 fL (ref 78.0–100.0)
Platelets: 224 10*3/uL (ref 150–400)
RBC: 3.49 MIL/uL — ABNORMAL LOW (ref 3.87–5.11)
RDW: 14.2 % (ref 11.5–15.5)
WBC: 16 10*3/uL — ABNORMAL HIGH (ref 4.0–10.5)

## 2016-06-20 LAB — BASIC METABOLIC PANEL
Anion gap: 7 (ref 5–15)
BUN: 19 mg/dL (ref 6–20)
CO2: 27 mmol/L (ref 22–32)
Calcium: 9 mg/dL (ref 8.9–10.3)
Chloride: 105 mmol/L (ref 101–111)
Creatinine, Ser: 0.71 mg/dL (ref 0.44–1.00)
GFR calc Af Amer: >60 mL/min (ref >60)
GFR calc non Af Amer: >60 mL/min (ref >60)
Glucose, Bld: 116 mg/dL — ABNORMAL HIGH (ref 65–99)
Potassium: 3.8 mmol/L (ref 3.5–5.1)
Sodium: 139 mmol/L (ref 135–145)

## 2016-06-20 MED ORDER — RIVAROXABAN 10 MG PO TABS: 10.0000 mg | ORAL_TABLET | Freq: Every day | ORAL | 0 refills | Status: DC

## 2016-06-20 MED ORDER — METHOCARBAMOL 500 MG PO TABS: 500.0000 mg | ORAL_TABLET | Freq: Four times a day (QID) | ORAL | 0 refills | Status: DC | PRN

## 2016-06-20 MED ORDER — OXYCODONE HCL 5 MG PO TABS
5.0000 mg | ORAL_TABLET | ORAL | 0 refills | Status: DC | PRN
Start: 2016-06-20 — End: 2016-12-05

## 2016-06-20 NOTE — Progress Notes (Signed)
   Subjective: 2 Days Post-Op Procedure(s) (LRB): LEFT TOTAL HIP ARTHROPLASTY ANTERIOR APPROACH (Left) Patient reports pain as mild.   Patient seen in rounds by Dr. Wynelle Link. Patient is well, and has had no acute complaints or problems Patient is ready to go home today  Objective: Vital signs in last 24 hours: Temp:  [98.2 F (36.8 C)-98.6 F (37 C)] 98.2 F (36.8 C) (04/27 0543) Pulse Rate:  [81-103] 81 (04/27 0543) Resp:  [15-16] 15 (04/27 0543) BP: (135-177)/(57-80) 164/60 (04/27 0543) SpO2:  [98 %-99 %] 98 % (04/27 0543)  Intake/Output from previous day:  Intake/Output Summary (Last 24 hours) at 06/20/16 0749 Last data filed at 06/20/16 0543  Gross per 24 hour  Intake             1500 ml  Output              910 ml  Net              590 ml    Intake/Output this shift: No intake/output data recorded.  Labs:  Recent Labs  06/17/16 0907 06/19/16 0510 06/20/16 0523  HGB 13.8 11.1* 10.4*    Recent Labs  06/19/16 0510 06/20/16 0523  WBC 12.5* 16.0*  RBC 3.92 3.49*  HCT 34.1* 31.2*  PLT 232 224    Recent Labs  06/19/16 0510 06/20/16 0523  NA 137 139  K 4.2 3.8  CL 106 105  CO2 25 27  BUN 16 19  CREATININE 0.65 0.71  GLUCOSE 138* 116*  CALCIUM 8.9 9.0    Recent Labs  06/17/16 0907  INR 0.96    EXAM: General - Patient is Alert, Appropriate and Oriented Extremity - Neurovascular intact Sensation intact distally Intact pulses distally Dorsiflexion/Plantar flexion intact Incision - clean, dry Motor Function - intact, moving foot and toes well on exam.   Assessment/Plan: 2 Days Post-Op Procedure(s) (LRB): LEFT TOTAL HIP ARTHROPLASTY ANTERIOR APPROACH (Left) Procedure(s) (LRB): LEFT TOTAL HIP ARTHROPLASTY ANTERIOR APPROACH (Left) Past Medical History:  Diagnosis Date  . Arrhythmia    palpitations  . Arthritis   . Cancer (HCC)    squamous cell Left hand  . Complication of anesthesia    slow to wake up  . Headache    hx of migraines    . Hypertension    Principal Problem:   OA (osteoarthritis) of hip  Estimated body mass index is 27.63 kg/m as calculated from the following:   Height as of this encounter: 5\' 3"  (1.6 m).   Weight as of this encounter: 70.8 kg (156 lb). Up with therapy Discharge home with home health Diet - Cardiac diet Follow up - in 2 weeks Activity - WBAT Disposition - Home Condition Upon Discharge - Stable D/C Meds - See DC Summary DVT Prophylaxis - Xarelto  Arlee Muslim, PA-C Orthopaedic Surgery 06/20/2016, 7:49 AM

## 2016-06-20 NOTE — Progress Notes (Signed)
OT Cancellation Note  Patient Details Name: Martha Richard MRN: [id number] DOB: April 28, 1929   Cancelled Treatment:    Reason Eval/Treat Not Completed: Other (comment).  Pt reports she got herself dressed this am (no socks).  Reviewed tub readiness with her.  She ambulated around the bed to chair with supervision.  No further OT needs.  Braden Deloach 06/20/2016, 10:06 AM  Lesle Chris, OTR/L [phone] 06/20/2016

## 2016-06-20 NOTE — Discharge Summary (Signed)
Physician Discharge Summary   Patient ID: Martha Richard MRN: [id number] DOB/AGE: 05-28-1929 83 y.o.  Admit date: 06/18/2016 Discharge date: 06-20-2016  Primary Diagnosis:  Osteoarthritis of the Left hip.   Admission Diagnoses:  Past Medical History:  Diagnosis Date  . Arrhythmia    palpitations  . Arthritis   . Cancer (HCC)    squamous cell Left hand  . Complication of anesthesia    slow to wake up  . Headache    hx of migraines  . Hypertension    Discharge Diagnoses:   Principal Problem:   OA (osteoarthritis) of hip  Estimated body mass index is 27.63 kg/m as calculated from the following:   Height as of this encounter: _0  (1.6 m).   Weight as of this encounter: 70.8 kg (156 lb).  Procedure(s) (LRB): LEFT TOTAL HIP ARTHROPLASTY ANTERIOR APPROACH (Left)   Consults: None  HPI: Martha Richard is a 83 y.o. female who has advanced end-  stage arthritis of their Left  hip with progressively worsening pain and  dysfunction.The patient has failed nonoperative management and presents for  total hip arthroplasty.   Laboratory Data: Admission on 06/18/2016  Component Date Value Ref Range Status  . WBC 06/19/2016 12.5* 4.0 - 10.5 K/uL Final  . RBC 06/19/2016 3.92  3.87 - 5.11 MIL/uL Final  . Hemoglobin 06/19/2016 11.1* 12.0 - 15.0 g/dL Final  . HCT 06/19/2016 34.1* 36.0 - 46.0 % Final  . MCV 06/19/2016 87.0  78.0 - 100.0 fL Final  . MCH 06/19/2016 28.3  26.0 - 34.0 pg Final  . MCHC 06/19/2016 32.6  30.0 - 36.0 g/dL Final  . RDW 06/19/2016 13.8  11.5 - 15.5 % Final  . Platelets 06/19/2016 232  150 - 400 K/uL Final  . Sodium 06/19/2016 137  135 - 145 mmol/L Final  . Potassium 06/19/2016 4.2  3.5 - 5.1 mmol/L Final  . Chloride 06/19/2016 106  101 - 111 mmol/L Final  . CO2 06/19/2016 25  22 - 32 mmol/L Final  . Glucose, Bld 06/19/2016 138* 65 - 99 mg/dL Final  . BUN 06/19/2016 16  6 - 20 mg/dL Final  . Creatinine, Ser 06/19/2016 0.65  0.44 - 1.00 mg/dL Final  .  Calcium 06/19/2016 8.9  8.9 - 10.3 mg/dL Final  . GFR calc non Af Amer 06/19/2016 >60  >60 mL/min Final  . GFR calc Af Amer 06/19/2016 >60  >60 mL/min Final   Comment: (NOTE) The eGFR has been calculated using the CKD EPI equation. This calculation has not been validated in all clinical situations. eGFR's persistently <60 mL/min signify possible Chronic Kidney Disease.   . Anion gap 06/19/2016 6  5 - 15 Final  . WBC 06/20/2016 16.0* 4.0 - 10.5 K/uL Final  . RBC 06/20/2016 3.49* 3.87 - 5.11 MIL/uL Final  . Hemoglobin 06/20/2016 10.4* 12.0 - 15.0 g/dL Final  . HCT 06/20/2016 31.2* 36.0 - 46.0 % Final  . MCV 06/20/2016 89.4  78.0 - 100.0 fL Final  . MCH 06/20/2016 29.8  26.0 - 34.0 pg Final  . MCHC 06/20/2016 33.3  30.0 - 36.0 g/dL Final  . RDW 06/20/2016 14.2  11.5 - 15.5 % Final  . Platelets 06/20/2016 224  150 - 400 K/uL Final  . Sodium 06/20/2016 139  135 - 145 mmol/L Final  . Potassium 06/20/2016 3.8  3.5 - 5.1 mmol/L Final  . Chloride 06/20/2016 105  101 - 111 mmol/L Final  . CO2 06/20/2016 27  22 -  32 mmol/L Final  . Glucose, Bld 06/20/2016 116* 65 - 99 mg/dL Final  . BUN 06/20/2016 19  6 - 20 mg/dL Final  . Creatinine, Ser 06/20/2016 0.71  0.44 - 1.00 mg/dL Final  . Calcium 06/20/2016 9.0  8.9 - 10.3 mg/dL Final  . GFR calc non Af Amer 06/20/2016 >60  >60 mL/min Final  . GFR calc Af Amer 06/20/2016 >60  >60 mL/min Final   Comment: (NOTE) The eGFR has been calculated using the CKD EPI equation. This calculation has not been validated in all clinical situations. eGFR's persistently <60 mL/min signify possible Chronic Kidney Disease.   Georgiann Hahn gap 06/20/2016 7  5 - 15 Final  Hospital Outpatient Visit on 06/17/2016  Component Date Value Ref Range Status  . aPTT 06/17/2016 29  24 - 36 seconds Final  . WBC 06/17/2016 9.1  4.0 - 10.5 K/uL Final  . RBC 06/17/2016 4.74  3.87 - 5.11 MIL/uL Final  . Hemoglobin 06/17/2016 13.8  12.0 - 15.0 g/dL Final  . HCT 06/17/2016 41.6  36.0 -  46.0 % Final  . MCV 06/17/2016 87.8  78.0 - 100.0 fL Final  . MCH 06/17/2016 29.1  26.0 - 34.0 pg Final  . MCHC 06/17/2016 33.2  30.0 - 36.0 g/dL Final  . RDW 06/17/2016 13.6  11.5 - 15.5 % Final  . Platelets 06/17/2016 284  150 - 400 K/uL Final  . Sodium 06/17/2016 137  135 - 145 mmol/L Final  . Potassium 06/17/2016 4.0  3.5 - 5.1 mmol/L Final  . Chloride 06/17/2016 103  101 - 111 mmol/L Final  . CO2 06/17/2016 27  22 - 32 mmol/L Final  . Glucose, Bld 06/17/2016 103* 65 - 99 mg/dL Final  . BUN 06/17/2016 17  6 - 20 mg/dL Final  . Creatinine, Ser 06/17/2016 0.86  0.44 - 1.00 mg/dL Final  . Calcium 06/17/2016 9.4  8.9 - 10.3 mg/dL Final  . Total Protein 06/17/2016 6.7  6.5 - 8.1 g/dL Final  . Albumin 06/17/2016 3.8  3.5 - 5.0 g/dL Final  . AST 06/17/2016 21  15 - 41 U/L Final  . ALT 06/17/2016 12* 14 - 54 U/L Final  . Alkaline Phosphatase 06/17/2016 78  38 - 126 U/L Final  . Total Bilirubin 06/17/2016 0.5  0.3 - 1.2 mg/dL Final  . GFR calc non Af Amer 06/17/2016 60* >60 mL/min Final  . GFR calc Af Amer 06/17/2016 >60  >60 mL/min Final   Comment: (NOTE) The eGFR has been calculated using the CKD EPI equation. This calculation has not been validated in all clinical situations. eGFR's persistently <60 mL/min signify possible Chronic Kidney Disease.   . Anion gap 06/17/2016 7  5 - 15 Final  . Prothrombin Time 06/17/2016 12.7  11.4 - 15.2 seconds Final  . INR 06/17/2016 0.96   Final  . ABO/RH(D) 06/17/2016 O POS   Final  . Antibody Screen 06/17/2016 NEG   Final  . Sample Expiration 06/17/2016 06/21/2016   Final  . Extend sample reason 06/17/2016 NO TRANSFUSIONS OR PREGNANCY IN THE PAST 3 MONTHS   Final  . MRSA, PCR 06/17/2016 NEGATIVE  NEGATIVE Final  . Staphylococcus aureus 06/17/2016 NEGATIVE  NEGATIVE Final   Comment:        The Xpert SA Assay (FDA approved for NASAL specimens in patients over 43 years of age), is one component of a comprehensive surveillance program.  Test  performance has been validated by [facility] for patients greater than or  equal to 47 year old. It is not intended to diagnose infection nor to guide or monitor treatment.   Hospital Outpatient Visit on 05/23/2016  Component Date Value Ref Range Status  . Rest HR 05/23/2016 75  bpm Final  . Rest BP 05/23/2016 191/94  mmHg Final  . Peak HR 05/23/2016 86  bpm Final  . Peak BP 05/23/2016 191/94  mmHg Final  . SSS 05/23/2016 8   Final  . SRS 05/23/2016 3   Final  . SDS 05/23/2016 5   Final  . LHR 05/23/2016 0.23   Final  . TID 05/23/2016 0.81   Final  . LV sys vol 05/23/2016 14  mL Final  . LV dias vol 05/23/2016 67  46 - 106 mL Final  Hospital Outpatient Visit on 05/15/2016  Component Date Value Ref Range Status  . aPTT 05/15/2016 28  24 - 36 seconds Final  . WBC 05/15/2016 9.2  4.0 - 10.5 K/uL Final  . RBC 05/15/2016 4.70  3.87 - 5.11 MIL/uL Final  . Hemoglobin 05/15/2016 13.8  12.0 - 15.0 g/dL Final  . HCT 05/15/2016 41.8  36.0 - 46.0 % Final  . MCV 05/15/2016 88.9  78.0 - 100.0 fL Final  . MCH 05/15/2016 29.4  26.0 - 34.0 pg Final  . MCHC 05/15/2016 33.0  30.0 - 36.0 g/dL Final  . RDW 05/15/2016 13.7  11.5 - 15.5 % Final  . Platelets 05/15/2016 306  150 - 400 K/uL Final  . Sodium 05/15/2016 138  135 - 145 mmol/L Final  . Potassium 05/15/2016 4.2  3.5 - 5.1 mmol/L Final  . Chloride 05/15/2016 103  101 - 111 mmol/L Final  . CO2 05/15/2016 28  22 - 32 mmol/L Final  . Glucose, Bld 05/15/2016 110* 65 - 99 mg/dL Final  . BUN 05/15/2016 18  6 - 20 mg/dL Final  . Creatinine, Ser 05/15/2016 0.73  0.44 - 1.00 mg/dL Final  . Calcium 05/15/2016 9.1  8.9 - 10.3 mg/dL Final  . Total Protein 05/15/2016 6.6  6.5 - 8.1 g/dL Final  . Albumin 05/15/2016 3.8  3.5 - 5.0 g/dL Final  . AST 05/15/2016 19  15 - 41 U/L Final  . ALT 05/15/2016 13* 14 - 54 U/L Final  . Alkaline Phosphatase 05/15/2016 74  38 - 126 U/L Final  . Total Bilirubin 05/15/2016 0.7  0.3 - 1.2 mg/dL Final  . GFR calc non Af  Amer 05/15/2016 >60  >60 mL/min Final  . GFR calc Af Amer 05/15/2016 >60  >60 mL/min Final   Comment: (NOTE) The eGFR has been calculated using the CKD EPI equation. This calculation has not been validated in all clinical situations. eGFR's persistently <60 mL/min signify possible Chronic Kidney Disease.   . Anion gap 05/15/2016 7  5 - 15 Final  . Prothrombin Time 05/15/2016 13.0  11.4 - 15.2 seconds Final  . INR 05/15/2016 0.98   Final  . ABO/RH(D) 05/15/2016 O POS   Final  . Antibody Screen 05/15/2016 NEG   Final  . Sample Expiration 05/15/2016 05/24/2016   Final  . Extend sample reason 05/15/2016 NO TRANSFUSIONS OR PREGNANCY IN THE PAST 3 MONTHS   Final  . MRSA, PCR 05/15/2016 NEGATIVE  NEGATIVE Final  . Staphylococcus aureus 05/15/2016 NEGATIVE  NEGATIVE Final   Comment:        The Xpert SA Assay (FDA approved for NASAL specimens in patients over 62 years of age), is one component of a comprehensive surveillance program.  Test performance has been validated by [facility] for patients greater than or equal to 69 year old. It is not intended to diagnose infection nor to guide or monitor treatment.   . ABO/RH(D) 05/15/2016 O POS   Final     X-Rays:Dg Pelvis Portable  Result Date: 06/18/2016 CLINICAL DATA:  Hip replacement surgery EXAM: PORTABLE PELVIS 1-  VIEW COMPARISON:  04/02/2009 FINDINGS: Interval left hip arthroplasty, components projecting in expected location. No fracture or dislocation. There Is a lateral surgical drain. Cartilage narrowing in the right hip is noted. IMPRESSION: Left hip arthroplasty without apparent complication. Electronically Signed   By: Lucrezia Europe M.D.   On: 06/18/2016 16:33   Nm Myocar Multi W/spect W/wall Motion / Ef  Result Date: 05/23/2016  The study is normal. There are no perfusion defects  The left ventricular ejection fraction is hyperdynamic (>65%).  This is a low risk study.  There was no ST segment deviation noted during stress.   LBBB at baseline    Dg C-arm 1-60 Min-no Report  Result Date: 06/18/2016 Fluoroscopy was utilized by the requesting physician.  No radiographic interpretation.    EKG: Orders placed or performed in visit on 05/19/16  . EKG     Hospital Course: Patient was admitted to [facility] and taken to the OR and underwent the above state procedure without complications.  Patient tolerated the procedure well and was later transferred to the recovery room and then to the orthopaedic floor for postoperative care.  They were given PO and IV analgesics for pain control following their surgery.  They were given 24 hours of postoperative antibiotics of  Anti-infectives    Start     Dose/Rate Route Frequency Ordered Stop   06/18/16 2000  ceFAZolin (ANCEF) IVPB 2g/100 mL premix     2 g 200 mL/hr over 30 Minutes Intravenous Every 6 hours 06/18/16 1701 06/19/16 0136   06/18/16 1150  ceFAZolin (ANCEF) 2-4 GM/100ML-% IVPB    Comments:  Waldron Session   : cabinet override      06/18/16 1150 06/18/16 1348   06/18/16 1143  ceFAZolin (ANCEF) IVPB 2g/100 mL premix     2 g 200 mL/hr over 30 Minutes Intravenous On call to O.R. 06/18/16 1143 06/18/16 1348     and started on DVT prophylaxis in the form of Xarelto.   PT and OT were ordered for total hip protocol.  The patient was allowed to be WBAT with therapy. Discharge planning was consulted to help with postop disposition and equipment needs.  Patient had a decent night on the evening of surgery.  They started to get up OOB with therapy on day one.  Hemovac drain was pulled without difficulty.  Continued to work with therapy into day two.  Dressing was changed on day two and the incision was healing well.   Patient was seen in rounds by Dr. Wynelle Link and was ready to go home on day two following surgery.  Discharge home with home health Diet - Cardiac diet Follow up - in 2 weeks Activity - WBAT Disposition - Home Condition Upon Discharge - Stable D/C  Meds - See DC Summary DVT Prophylaxis - Xarelto   Discharge Instructions    Call MD / Call 911    Complete by:  As directed    If you experience chest pain or shortness of breath, CALL 911 and be transported to the hospital emergency room.  If you develope a fever above 101 F, pus (white  drainage) or increased drainage or redness at the wound, or calf pain, call your surgeon's office.   Change dressing    Complete by:  As directed    You may change your dressing dressing daily with sterile 4 x 4 inch gauze dressing and paper tape.  Do not submerge the incision under water.   Constipation Prevention    Complete by:  As directed    Drink plenty of fluids.  Prune juice may be helpful.  You may use a stool softener, such as Colace (over the counter) 100 mg twice a day.  Use MiraLax (over the counter) for constipation as needed.   Diet - low sodium heart healthy    Complete by:  As directed    Discharge instructions    Complete by:  As directed    Pick up stool softner and laxative for home use following surgery while on pain medications. Do not submerge incision under water. Please use good hand washing techniques while changing dressing each day. May shower starting three days after surgery. Please use a clean towel to pat the incision dry following showers. Continue to use ice for pain and swelling after surgery. Do not use any lotions or creams on the incision until instructed by your surgeon.  Wear both TED hose on both legs during the day every day for three weeks, but may have off at night at home.  Postoperative Constipation Protocol  Constipation - defined medically as fewer than three stools per week and severe constipation as less than one stool per week.  One of the most common issues patients have following surgery is constipation.  Even if you have a regular bowel pattern at home, your normal regimen is likely to be disrupted due to multiple reasons following surgery.   Combination of anesthesia, postoperative narcotics, change in appetite and fluid intake all can affect your bowels.  In order to avoid complications following surgery, here are some recommendations in order to help you during your recovery period.  Colace (docusate) - Pick up an over-the-counter form of Colace or another stool softener and take twice a day as long as you are requiring postoperative pain medications.  Take with a full glass of water daily.  If you experience loose stools or diarrhea, hold the colace until you stool forms back up.  If your symptoms do not get better within 1 week or if they get worse, check with your doctor.  Dulcolax (bisacodyl) - Pick up over-the-counter and take as directed by the product packaging as needed to assist with the movement of your bowels.  Take with a full glass of water.  Use this product as needed if not relieved by Colace only.   MiraLax (polyethylene glycol) - Pick up over-the-counter to have on hand.  MiraLax is a solution that will increase the amount of water in your bowels to assist with bowel movements.  Take as directed and can mix with a glass of water, juice, soda, coffee, or tea.  Take if you go more than two days without a movement. Do not use MiraLax more than once per day. Call your doctor if you are still constipated or irregular after using this medication for 7 days in a row.  If you continue to have problems with postoperative constipation, please contact the office for further assistance and recommendations.  If you experience "the worst abdominal pain ever" or develop nausea or vomiting, please contact the office immediatly for further recommendations for treatment.  Take Xarelto for two and a half more weeks, then discontinue Xarelto. Once the patient has completed the blood thinner regimen, then take a Baby 81 mg Aspirin daily for three more weeks.   Do not sit on low chairs, stoools or toilet seats, as it may be difficult to get  up from low surfaces    Complete by:  As directed    Driving restrictions    Complete by:  As directed    No driving until released by the physician.   Increase activity slowly as tolerated    Complete by:  As directed    Lifting restrictions    Complete by:  As directed    No lifting until released by the physician.   Patient may shower    Complete by:  As directed    You may shower without a dressing once there is no drainage.  Do not wash over the wound.  If drainage remains, do not shower until drainage stops.   TED hose    Complete by:  As directed    Use stockings (TED hose) for 3 weeks on both leg(s).  You may remove them at night for sleeping.   Weight bearing as tolerated    Complete by:  As directed    Laterality:  left   Extremity:  Lower     Allergies as of 06/20/2016      Reactions   Ace Inhibitors Cough   Nsaids Other (See Comments)   GI upset    Peanut-containing Drug Products Other (See Comments)   headaches   Ultram [tramadol Hcl] Other (See Comments)   Headaches. Patient is unsure of this.   Penicillins Itching, Rash   Has patient had a PCN reaction causing immediate rash, facial/tongue/throat swelling, SOB or lightheadedness with hypotension: No Has patient had a PCN reaction causing severe rash involving mucus membranes or skin necrosis: No Has patient had a PCN reaction that required hospitalization No Has patient had a PCN reaction occurring within the last 10 years: No If all of the above answers are "NO", then may proceed with Cephalosporin use.      Medication List    TAKE these medications   chlorthalidone 25 MG tablet Commonly known as:  HYGROTON Take 6.25 mg by mouth daily as needed (elevated blood pressure). Takes 1/4 tablet only as needed for high blood pressure   methocarbamol 500 MG tablet Commonly known as:  ROBAXIN Take 1 tablet (500 mg total) by mouth every 6 (six) hours as needed for muscle spasms.   oxyCODONE 5 MG immediate release  tablet Commonly known as:  Oxy IR/ROXICODONE Take 1-2 tablets (5-10 mg total) by mouth every 4 (four) hours as needed for moderate pain or severe pain.   rivaroxaban 10 MG Tabs tablet Commonly known as:  XARELTO Take 1 tablet (10 mg total) by mouth daily with breakfast. Take Xarelto for two and a half more weeks following discharge from the hospital, then discontinue Xarelto. Once the patient has completed the blood thinner regimen, then take a Baby 81 mg Aspirin daily for three more weeks.   trolamine salicylate 10 % cream Commonly known as:  ASPERCREME Apply 1 application topically daily as needed for muscle pain.            Durable Medical Equipment        Start     Ordered   06/19/16 1426  For home use only DME Walker rolling  Once    Question:  Patient needs a walker to treat with the following condition  Answer:  S/P knee replacement   06/19/16 1426     Follow-up Information    [facility] Follow up.   Specialty:  Home Health Services Why:  physical therapy Contact information: [address] [place] [address] [phone]        [facility] Follow up.   Why:  rolling walker Contact information: [place] [address] [phone]        Gearlean Alf, MD. Schedule an appointment as soon as possible for a visit on 07/01/2016.   Specialty:  Orthopedic Surgery Contact information: [address] [place] [address] [phone]           Signed: Arlee Muslim, PA-C Orthopaedic Surgery 06/20/2016, 7:53 AM

## 2016-06-20 NOTE — Progress Notes (Signed)
Physical Therapy Treatment Patient Details Name: Martha Richard MRN: [id number] DOB: 1929-10-31 Today's Date: 06/20/2016    History of Present Illness s/p L DA THA    PT Comments    Pt progressing steadily with mobility despite increased pain this am.  Reviewed therex, stairs and car transfers with pt and spouse.   Follow Up Recommendations  Home health PT     Equipment Recommendations  Rolling walker with 5" wheels    Recommendations for Other Services OT consult     Precautions / Restrictions Precautions Precautions: Fall Restrictions Weight Bearing Restrictions: No Other Position/Activity Restrictions: WBAT    Mobility  Bed Mobility               General bed mobility comments: Pt up in chair and declines back to bed  Transfers Overall transfer level: Needs assistance Equipment used: Rolling walker (2 wheeled) Transfers: Sit to/from Stand Sit to Stand: Min guard;Supervision         General transfer comment: cues for LE management and use of UEs to self assist  Ambulation/Gait Ambulation/Gait assistance: Min guard;Supervision Ambulation Distance (Feet): 120 Feet Assistive device: Rolling walker (2 wheeled) Gait Pattern/deviations: Step-to pattern;Step-through pattern;Decreased step length - right;Decreased step length - left;Shuffle;Trunk flexed Gait velocity: decr Gait velocity interpretation: Below normal speed for age/gender General Gait Details: cues for posture, position from RW and initial sequence   Stairs Stairs: Yes   Stair Management: With walker;One rail Right Number of Stairs: 2 General stair comments: single step twice with doorframe and RW.  cues for sequence and spouse assisting  Wheelchair Mobility    Modified Rankin (Stroke Patients Only)       Balance                                            Cognition Arousal/Alertness: Awake/alert Behavior During Therapy: WFL for tasks  assessed/performed Overall Cognitive Status: Within Functional Limits for tasks assessed                                        Exercises Total Joint Exercises Ankle Circles/Pumps: AROM;Both;20 reps;Supine Quad Sets: AROM;Left;10 reps;Supine Heel Slides: AAROM;Left;Supine;20 reps Hip ABduction/ADduction: AAROM;Left;15 reps;Supine Long Arc Quad: 10 reps;AROM;Left;Seated    General Comments        Pertinent Vitals/Pain Pain Assessment: 0-10 Pain Score: 6  Pain Location: L hip Pain Descriptors / Indicators: Sore;Burning Pain Intervention(s): Limited activity within patient's tolerance;Monitored during session;Premedicated before session;Ice applied    Home Living                      Prior Function            PT Goals (current goals can now be found in the care plan section) Acute Rehab PT Goals Patient Stated Goal: return to independence PT Goal Formulation: With patient Time For Goal Achievement: 06/23/16 Potential to Achieve Goals: Good Progress towards PT goals: Progressing toward goals    Frequency    7X/week      PT Plan Current plan remains appropriate    Co-evaluation             End of Session Equipment Utilized During Treatment: Gait belt Activity Tolerance: Patient tolerated treatment well Patient left: in chair;with call bell/phone within reach;with  family/visitor present Nurse Communication: Mobility status PT Visit Diagnosis: Unsteadiness on feet (R26.81);Difficulty in walking, not elsewhere classified (R26.2)     Time: 9518-8416 PT Time Calculation (min) (ACUTE ONLY): 38 min  Charges:  $Gait Training: 8-22 mins $Therapeutic Exercise: 8-22 mins $Therapeutic Activity: 8-22 mins                    G Codes:       Pg [phone]    Evora Schechter 06/20/2016, 12:41 PM

## 2016-06-21 DIAGNOSIS — Z471 Aftercare following joint replacement surgery: Secondary | ICD-10-CM | POA: Diagnosis not present

## 2016-06-21 DIAGNOSIS — I10 Essential (primary) hypertension: Secondary | ICD-10-CM | POA: Diagnosis not present

## 2016-06-21 DIAGNOSIS — Z79891 Long term (current) use of opiate analgesic: Secondary | ICD-10-CM | POA: Diagnosis not present

## 2016-06-21 DIAGNOSIS — Z85828 Personal history of other malignant neoplasm of skin: Secondary | ICD-10-CM | POA: Diagnosis not present

## 2016-06-21 DIAGNOSIS — Z96642 Presence of left artificial hip joint: Secondary | ICD-10-CM | POA: Diagnosis not present

## 2016-06-21 DIAGNOSIS — Z7901 Long term (current) use of anticoagulants: Secondary | ICD-10-CM | POA: Diagnosis not present

## 2016-06-21 DIAGNOSIS — I499 Cardiac arrhythmia, unspecified: Secondary | ICD-10-CM | POA: Diagnosis not present

## 2016-06-24 DIAGNOSIS — Z96642 Presence of left artificial hip joint: Secondary | ICD-10-CM | POA: Diagnosis not present

## 2016-06-24 DIAGNOSIS — I499 Cardiac arrhythmia, unspecified: Secondary | ICD-10-CM | POA: Diagnosis not present

## 2016-06-24 DIAGNOSIS — I10 Essential (primary) hypertension: Secondary | ICD-10-CM | POA: Diagnosis not present

## 2016-06-24 DIAGNOSIS — Z79891 Long term (current) use of opiate analgesic: Secondary | ICD-10-CM | POA: Diagnosis not present

## 2016-06-24 DIAGNOSIS — Z85828 Personal history of other malignant neoplasm of skin: Secondary | ICD-10-CM | POA: Diagnosis not present

## 2016-06-24 DIAGNOSIS — Z471 Aftercare following joint replacement surgery: Secondary | ICD-10-CM | POA: Diagnosis not present

## 2016-06-24 DIAGNOSIS — Z7901 Long term (current) use of anticoagulants: Secondary | ICD-10-CM | POA: Diagnosis not present

## 2016-06-25 DIAGNOSIS — I10 Essential (primary) hypertension: Secondary | ICD-10-CM | POA: Diagnosis not present

## 2016-06-25 DIAGNOSIS — Z471 Aftercare following joint replacement surgery: Secondary | ICD-10-CM | POA: Diagnosis not present

## 2016-06-25 DIAGNOSIS — Z85828 Personal history of other malignant neoplasm of skin: Secondary | ICD-10-CM | POA: Diagnosis not present

## 2016-06-25 DIAGNOSIS — Z79891 Long term (current) use of opiate analgesic: Secondary | ICD-10-CM | POA: Diagnosis not present

## 2016-06-25 DIAGNOSIS — Z7901 Long term (current) use of anticoagulants: Secondary | ICD-10-CM | POA: Diagnosis not present

## 2016-06-25 DIAGNOSIS — Z96642 Presence of left artificial hip joint: Secondary | ICD-10-CM | POA: Diagnosis not present

## 2016-06-25 DIAGNOSIS — I499 Cardiac arrhythmia, unspecified: Secondary | ICD-10-CM | POA: Diagnosis not present

## 2016-06-27 DIAGNOSIS — I499 Cardiac arrhythmia, unspecified: Secondary | ICD-10-CM | POA: Diagnosis not present

## 2016-06-27 DIAGNOSIS — Z96642 Presence of left artificial hip joint: Secondary | ICD-10-CM | POA: Diagnosis not present

## 2016-06-27 DIAGNOSIS — Z471 Aftercare following joint replacement surgery: Secondary | ICD-10-CM | POA: Diagnosis not present

## 2016-06-27 DIAGNOSIS — I10 Essential (primary) hypertension: Secondary | ICD-10-CM | POA: Diagnosis not present

## 2016-06-27 DIAGNOSIS — Z7901 Long term (current) use of anticoagulants: Secondary | ICD-10-CM | POA: Diagnosis not present

## 2016-06-27 DIAGNOSIS — Z79891 Long term (current) use of opiate analgesic: Secondary | ICD-10-CM | POA: Diagnosis not present

## 2016-06-27 DIAGNOSIS — Z85828 Personal history of other malignant neoplasm of skin: Secondary | ICD-10-CM | POA: Diagnosis not present

## 2016-06-30 DIAGNOSIS — Z7901 Long term (current) use of anticoagulants: Secondary | ICD-10-CM | POA: Diagnosis not present

## 2016-06-30 DIAGNOSIS — I499 Cardiac arrhythmia, unspecified: Secondary | ICD-10-CM | POA: Diagnosis not present

## 2016-06-30 DIAGNOSIS — Z79891 Long term (current) use of opiate analgesic: Secondary | ICD-10-CM | POA: Diagnosis not present

## 2016-06-30 DIAGNOSIS — I10 Essential (primary) hypertension: Secondary | ICD-10-CM | POA: Diagnosis not present

## 2016-06-30 DIAGNOSIS — Z471 Aftercare following joint replacement surgery: Secondary | ICD-10-CM | POA: Diagnosis not present

## 2016-06-30 DIAGNOSIS — Z85828 Personal history of other malignant neoplasm of skin: Secondary | ICD-10-CM | POA: Diagnosis not present

## 2016-06-30 DIAGNOSIS — Z96642 Presence of left artificial hip joint: Secondary | ICD-10-CM | POA: Diagnosis not present

## 2016-07-04 DIAGNOSIS — Z7901 Long term (current) use of anticoagulants: Secondary | ICD-10-CM | POA: Diagnosis not present

## 2016-07-04 DIAGNOSIS — Z79891 Long term (current) use of opiate analgesic: Secondary | ICD-10-CM | POA: Diagnosis not present

## 2016-07-04 DIAGNOSIS — Z85828 Personal history of other malignant neoplasm of skin: Secondary | ICD-10-CM | POA: Diagnosis not present

## 2016-07-04 DIAGNOSIS — Z471 Aftercare following joint replacement surgery: Secondary | ICD-10-CM | POA: Diagnosis not present

## 2016-07-04 DIAGNOSIS — I10 Essential (primary) hypertension: Secondary | ICD-10-CM | POA: Diagnosis not present

## 2016-07-04 DIAGNOSIS — Z96642 Presence of left artificial hip joint: Secondary | ICD-10-CM | POA: Diagnosis not present

## 2016-07-04 DIAGNOSIS — I499 Cardiac arrhythmia, unspecified: Secondary | ICD-10-CM | POA: Diagnosis not present

## 2016-07-23 DIAGNOSIS — Z96642 Presence of left artificial hip joint: Secondary | ICD-10-CM | POA: Diagnosis not present

## 2016-07-23 DIAGNOSIS — Z471 Aftercare following joint replacement surgery: Secondary | ICD-10-CM | POA: Diagnosis not present

## 2016-08-20 DIAGNOSIS — H524 Presbyopia: Secondary | ICD-10-CM | POA: Diagnosis not present

## 2016-09-11 DIAGNOSIS — M1991 Primary osteoarthritis, unspecified site: Secondary | ICD-10-CM | POA: Diagnosis not present

## 2016-09-11 DIAGNOSIS — K210 Gastro-esophageal reflux disease with esophagitis: Secondary | ICD-10-CM | POA: Diagnosis not present

## 2016-09-11 DIAGNOSIS — I10 Essential (primary) hypertension: Secondary | ICD-10-CM | POA: Diagnosis not present

## 2016-09-11 DIAGNOSIS — I951 Orthostatic hypotension: Secondary | ICD-10-CM | POA: Diagnosis not present

## 2016-09-11 DIAGNOSIS — E7800 Pure hypercholesterolemia, unspecified: Secondary | ICD-10-CM | POA: Diagnosis not present

## 2016-09-15 DIAGNOSIS — M1991 Primary osteoarthritis, unspecified site: Secondary | ICD-10-CM | POA: Diagnosis not present

## 2016-09-15 DIAGNOSIS — K210 Gastro-esophageal reflux disease with esophagitis: Secondary | ICD-10-CM | POA: Diagnosis not present

## 2016-09-15 DIAGNOSIS — E7800 Pure hypercholesterolemia, unspecified: Secondary | ICD-10-CM | POA: Diagnosis not present

## 2016-09-15 DIAGNOSIS — I10 Essential (primary) hypertension: Secondary | ICD-10-CM | POA: Diagnosis not present

## 2016-09-15 DIAGNOSIS — I951 Orthostatic hypotension: Secondary | ICD-10-CM | POA: Diagnosis not present

## 2016-12-02 DIAGNOSIS — X32XXXD Exposure to sunlight, subsequent encounter: Secondary | ICD-10-CM | POA: Diagnosis not present

## 2016-12-02 DIAGNOSIS — L821 Other seborrheic keratosis: Secondary | ICD-10-CM | POA: Diagnosis not present

## 2016-12-02 DIAGNOSIS — L570 Actinic keratosis: Secondary | ICD-10-CM | POA: Diagnosis not present

## 2016-12-02 DIAGNOSIS — L820 Inflamed seborrheic keratosis: Secondary | ICD-10-CM | POA: Diagnosis not present

## 2016-12-05 ENCOUNTER — Encounter: Payer: Self-pay | Admitting: Cardiovascular Disease

## 2016-12-05 ENCOUNTER — Telehealth: Payer: Self-pay | Admitting: Cardiovascular Disease

## 2016-12-05 ENCOUNTER — Ambulatory Visit (INDEPENDENT_AMBULATORY_CARE_PROVIDER_SITE_OTHER): Payer: Medicare Other | Admitting: Cardiovascular Disease

## 2016-12-05 ENCOUNTER — Encounter (INDEPENDENT_AMBULATORY_CARE_PROVIDER_SITE_OTHER): Payer: Self-pay

## 2016-12-05 VITALS — BP 158/78 | HR 64 | Ht 63.0 in | Wt 158.8 lb

## 2016-12-05 DIAGNOSIS — I10 Essential (primary) hypertension: Secondary | ICD-10-CM

## 2016-12-05 DIAGNOSIS — M79604 Pain in right leg: Secondary | ICD-10-CM

## 2016-12-05 DIAGNOSIS — I350 Nonrheumatic aortic (valve) stenosis: Secondary | ICD-10-CM | POA: Diagnosis not present

## 2016-12-05 DIAGNOSIS — I447 Left bundle-branch block, unspecified: Secondary | ICD-10-CM | POA: Diagnosis not present

## 2016-12-05 NOTE — Patient Instructions (Signed)
Medication Instructions:   Your physician recommends that you continue on your current medications as directed. Please refer to the Current Medication list given to you today.  Labwork:  NONE  Testing/Procedures: Your physician has requested that you have a lower extremity venous duplex. This test is an ultrasound of the veins in the legs. It looks at venous blood flow that carries blood from the heart to the legs. Allow one hour for a Lower Venous exam. Allow thirty minutes for an Upper Venous exam. There are no restrictions or special instructions.  Follow-Up:  Your physician recommends that you schedule a follow-up appointment in: 1 year. You will receive a reminder letter in the mail in about 10 months reminding you to call and schedule your appointment. If you don't receive this letter, please contact our office.  Any Other Special Instructions Will Be Listed Below (If Applicable).  If you need a refill on your cardiac medications before your next appointment, please call your pharmacy

## 2016-12-05 NOTE — Progress Notes (Signed)
SUBJECTIVE: The patient presents for routine follow-up. She has a chronic left bundle branch block.   Echocardiogram on 05/23/16 demonstrated normal left ventricular systolic function and regional wall motion, LVEF 60-65%, moderate to severe LVH, grade 1 diastolic dysfunction with high ventricular filling pressures, mild to moderate aortic stenosis, mild aortic regurgitation, moderate left atrial dilatation, and a small circumferential pericardial effusion.  She underwent a normal nuclear stress test on 05/23/16.  She denies chest pain, palpitations, shortness of breath, and dizziness.  She said her blood pressure fluctuates and dips low at times. This morning it was 97/50's. She ate chocolate and potato chips to elevate it.  She recently drove to [place] to visit her son. Over the span of 3 days she did quite a bit of driving. Since then she has had "a pain in my vein ". It is located on the posterior aspect of the right thigh. It hurts to sit at the edge of a chair or couch. She denies fevers. She denies leg swelling.   Review of Systems: As per "subjective", otherwise negative.  Allergies  Allergen Reactions  . Ace Inhibitors Cough  . Caffeine Other (See Comments)    "shoots my BP up"  . Nsaids Other (See Comments)    GI upset    . Peanut-Containing Drug Products Other (See Comments)    headaches  . Ultram [Tramadol Hcl] Other (See Comments)    Headaches. Patient is unsure of this.  . Penicillins Itching and Rash    Has patient had a PCN reaction causing immediate rash, facial/tongue/throat swelling, SOB or lightheadedness with hypotension: No Has patient had a PCN reaction causing severe rash involving mucus membranes or skin necrosis: No Has patient had a PCN reaction that required hospitalization No Has patient had a PCN reaction occurring within the last 10 years: No If all of the above answers are "NO", then may proceed with Cephalosporin use.     Current  Outpatient Prescriptions  Medication Sig Dispense Refill  . chlorthalidone (HYGROTON) 25 MG tablet Take 6.25 mg by mouth daily as needed (elevated blood pressure). Takes 1/4 tablet only as needed for high blood pressure     . trolamine salicylate (ASPERCREME) 10 % cream Apply 1 application topically daily as needed for muscle pain.     No current facility-administered medications for this visit.     Past Medical History:  Diagnosis Date  . Arrhythmia    palpitations  . Arthritis   . Cancer (HCC)    squamous cell Left hand  . Complication of anesthesia    slow to wake up  . Headache    hx of migraines  . Hypertension     Past Surgical History:  Procedure Laterality Date  . APPENDECTOMY    . fibroadenoma left breast removal    . OTHER SURGICAL HISTORY     appendectomy  . TOTAL HIP ARTHROPLASTY Left 06/18/2016   Procedure: LEFT TOTAL HIP ARTHROPLASTY ANTERIOR APPROACH;  Surgeon: Gaynelle Arabian, MD;  Location: [facility];  Service: Orthopedics;  Laterality: Left;  . UTERINE FIBROID SURGERY      Social History   Social History  . Marital status: Married    Spouse name: N/A  . Number of children: N/A  . Years of education: N/A   Occupational History  . Not on file.   Social History Main Topics  . Smoking status: Former Research scientist (life sciences)  . Smokeless tobacco: Never Used     Comment: years ago  50 plus years  . Alcohol use No  . Drug use: No  . Sexual activity: Not Currently   Other Topics Concern  . Not on file   Social History Narrative  . No narrative on file     Vitals:   12/05/16 1540  BP: (!) 158/78  Pulse: 64  Weight: 158 lb 12.8 oz (72 kg)  Height: 5\' 3"  (1.6 m)    Wt Readings from Last 3 Encounters:  12/05/16 158 lb 12.8 oz (72 kg)  06/18/16 156 lb (70.8 kg)  06/17/16 156 lb (70.8 kg)     PHYSICAL EXAM General: NAD HEENT: Normal. Neck: No JVD, no thyromegaly. Lungs: Clear to auscultation bilaterally with normal respiratory effort. CV: Nondisplaced PMI.   Regular rate and rhythm, normal S1/S2, no S3/S4, 2/6 ejection systolic murmur loudest over RUSB. No pretibial or periankle edema.  No carotid bruit.   Abdomen: Soft, nontender, no distention.  Neurologic: Alert and oriented.  Psych: Normal affect. Skin: Normal. Musculoskeletal: No gross deformities.    ECG: Most recent ECG reviewed.   Labs: Lab Results  Component Value Date/Time   K 3.8 06/20/2016 05:23 AM   BUN 19 06/20/2016 05:23 AM   CREATININE 0.71 06/20/2016 05:23 AM   ALT 12 (L) 06/17/2016 09:07 AM   HGB 10.4 (L) 06/20/2016 05:23 AM     Lipids: No results found for: LDLCALC, LDLDIRECT, CHOL, TRIG, HDL     ASSESSMENT AND PLAN: 1. Aortic valvular disease with mild to moderate aortic stenosis and mild regurgitation: Symptomatically stable. I will repeat an echocardiogram in March 2020.  2. Left bundle-branch block: Chronic. Stable.  3. Hypertension: Elevated on chlorthalidone 25 mg. however, as noted above, it also fluctuates and dips low at times. I will not make any medication adjustments today.  4. Right posterior thigh pain: Pain developed after driving for prolonged periods of time. There is tenderness when significant pressure is applied to it. I will obtain right lower extremity Dopplers to evaluate for DVT.    Disposition: Follow up 1 year.   Kate Sable, M.D., F.A.C.C.

## 2016-12-05 NOTE — Telephone Encounter (Signed)
Right Leg Venous Doppler (entire leg) r/o DVT dx: pain, swelling and tenderness  Scheduled for Forestine Na on Monday Dec 08, 2016 at 12:30  Arrive 12:15

## 2016-12-08 ENCOUNTER — Ambulatory Visit (HOSPITAL_COMMUNITY): Admission: RE | Admit: 2016-12-08 | Payer: Medicare Other | Source: Ambulatory Visit

## 2017-02-10 DIAGNOSIS — H35033 Hypertensive retinopathy, bilateral: Secondary | ICD-10-CM | POA: Diagnosis not present

## 2017-02-10 DIAGNOSIS — H26491 Other secondary cataract, right eye: Secondary | ICD-10-CM | POA: Diagnosis not present

## 2017-02-10 DIAGNOSIS — H01009 Unspecified blepharitis unspecified eye, unspecified eyelid: Secondary | ICD-10-CM | POA: Diagnosis not present

## 2017-02-10 DIAGNOSIS — Z961 Presence of intraocular lens: Secondary | ICD-10-CM | POA: Diagnosis not present

## 2017-02-10 DIAGNOSIS — H35352 Cystoid macular degeneration, left eye: Secondary | ICD-10-CM | POA: Diagnosis not present

## 2017-03-16 ENCOUNTER — Encounter (INDEPENDENT_AMBULATORY_CARE_PROVIDER_SITE_OTHER): Payer: Self-pay | Admitting: Ophthalmology

## 2017-03-17 NOTE — Progress Notes (Addendum)
[facility] Note  03/18/2017     CHIEF COMPLAINT Patient presents for Retina Evaluation   HISTORY OF PRESENT ILLNESS: Martha Richard is a 84 y.o. female who presents to the clinic today for:   HPI    Retina Evaluation    In left eye.  This started 1 month ago.  Associated Symptoms Floaters.  Negative for Flashes, Blind Spot, Photophobia, Scalp Tenderness, Fever, Pain, Glare, Jaw Claudication, Weight Loss, Distortion, Redness, Trauma, Shoulder/Hip pain and Fatigue.  Context:  distance vision, mid-range vision and near vision.  Response to treatment was no improvement.  I, the attending physician,  performed the HPI with the patient and updated documentation appropriately.          Comments    Pt presents today on the referral of Dr. Kathlen Mody for BRVO OS, pt had appt with him about a month ago, pt went in for Yag OU, but Dr. Kathlen Mody saw fluid OS that he wanted checked before he did that eye, pt says she sees floaters OS, but denies pain, wavy vision or flashes, pt denies the use of gtts/vits and it not diabetic,        Last edited by Bernarda Caffey, MD on 03/18/2017  2:00 PM. (History)    Pt states she went to Dr. Kathlen Mody to have "the film removed"; Pt states she was told when she "had the film removed 10 years ago the hole they made was too small, he told me there was fluid in the back of my eye and wanted me to have it looked at before he did the laser"; Pt states she had cataract sx 11 years ago at [facility]; Pt states she takes blood pressure medication "when I need it", pt states if "I eat salt my blood pressure shoots up";   Referring physician: Hortencia Pilar, MD [place], [place] [address]  HISTORICAL INFORMATION:   Selected notes from the MEDICAL RECORD NUMBER Referred by Dr. Read Drivers for concern of cystic IRF OS;  LEE- 12.18.18 (C. Weaver) [BCVA OD: 20/30 OS: 20/50] Ocular Hx- pseudophakia OU, S/P YAG Cap OU PMH- HTN,  former smoker    CURRENT MEDICATIONS: No current outpatient medications on file. (Ophthalmic Drugs)   No current facility-administered medications for this visit.  (Ophthalmic Drugs)   Current Outpatient Medications (Other)  Medication Sig  . chlorthalidone (HYGROTON) 25 MG tablet Take 6.25 mg by mouth daily as needed (elevated blood pressure). Takes 1/4 tablet only as needed for high blood pressure   . hydrochlorothiazide (HYDRODIURIL) 12.5 MG tablet Take 12.5 mg by mouth daily.  Marland Kitchen trolamine salicylate (ASPERCREME) 10 % cream Apply 1 application topically daily as needed for muscle pain.   Current Facility-Administered Medications (Other)  Medication Route  . Bevacizumab (AVASTIN) SOLN 1.25 mg Intravitreal      REVIEW OF SYSTEMS: ROS    Positive for: Eyes   Negative for: Constitutional, Gastrointestinal, Neurological, Skin, Genitourinary, Musculoskeletal, HENT, Endocrine, Cardiovascular, Respiratory, Psychiatric, Allergic/Imm, Heme/Lymph   Last edited by Debbrah Alar, COT on 03/18/2017  1:12 PM. (History)       ALLERGIES Allergies  Allergen Reactions  . Ace Inhibitors Cough  . Caffeine Other (See Comments)    "shoots my BP up"  . Nsaids Other (See Comments)    GI upset    . Peanut-Containing Drug Products Other (See Comments)    headaches  . Ultram [Tramadol Hcl] Other (See Comments)    Headaches.  Patient is unsure of this.  . Penicillins Itching and Rash    Has patient had a PCN reaction causing immediate rash, facial/tongue/throat swelling, SOB or lightheadedness with hypotension: No Has patient had a PCN reaction causing severe rash involving mucus membranes or skin necrosis: No Has patient had a PCN reaction that required hospitalization No Has patient had a PCN reaction occurring within the last 10 years: No If all of the above answers are "NO", then may proceed with Cephalosporin use.     PAST MEDICAL HISTORY Past Medical History:  Diagnosis Date  .  Arrhythmia    palpitations  . Arthritis   . Cancer (HCC)    squamous cell Left hand  . Complication of anesthesia    slow to wake up  . Headache    hx of migraines  . Hypertension    Past Surgical History:  Procedure Laterality Date  . APPENDECTOMY    . CATARACT EXTRACTION     OU about 10 yrs ago  . fibroadenoma left breast removal    . OTHER SURGICAL HISTORY     appendectomy  . TOTAL HIP ARTHROPLASTY Left 06/18/2016   Procedure: LEFT TOTAL HIP ARTHROPLASTY ANTERIOR APPROACH;  Surgeon: Gaynelle Arabian, MD;  Location: [facility];  Service: Orthopedics;  Laterality: Left;  . UTERINE FIBROID SURGERY      FAMILY HISTORY Family History  Problem Relation Age of Onset  . Heart disease Mother   . Heart attack Mother   . Heart failure Mother   . Stroke Father   . Heart attack Maternal Aunt   . Heart disease Maternal Aunt   . Heart failure Maternal Aunt   . Amblyopia Neg Hx   . Blindness Neg Hx   . Cataracts Neg Hx   . Glaucoma Neg Hx   . Macular degeneration Neg Hx   . Retinal detachment Neg Hx   . Strabismus Neg Hx   . Retinitis pigmentosa Neg Hx     SOCIAL HISTORY Social History   Tobacco Use  . Smoking status: Former Research scientist (life sciences)  . Smokeless tobacco: Never Used  . Tobacco comment: years ago 50 plus years  Substance Use Topics  . Alcohol use: No  . Drug use: No         OPHTHALMIC EXAM:  Base Eye Exam    Visual Acuity (Snellen - Linear)      Right Left   Dist [place] 20/25 +1 20/50   Dist ph [place] NI NI  Pt does not wear her glasses for distance       Tonometry (Tonopen, 1:42 PM)      Right Left   Pressure 14 19       Pupils      Dark Light Shape React APD   Right 2 1 Round Brisk None   Left 2 1 Round Brisk None       Visual Fields (Counting fingers)      Left Right    Full Full       Extraocular Movement      Right Left    Full, Ortho Full, Ortho       Neuro/Psych    Oriented x3:  Yes   Mood/Affect:  Normal       Dilation    Both eyes:  1.0%  Mydriacyl, 2.5% Phenylephrine @ 1:42 PM        Slit Lamp and Fundus Exam    Slit Lamp Exam      Right Left  Lids/Lashes Dermatochalasis - upper lid Dermatochalasis - upper lid   Conjunctiva/Sclera White and quiet White and quiet   Cornea Arcus, nasal and temporal LRI Irregular epithelium, 2-3+ Punctate epithelial erosions, Arcus, nasal and temporal LRI   Anterior Chamber Deep and quiet Deep and quiet, no cell or flare   Iris Round and moderately dilated to 5.5 mm Round and moderately dilated to 5.5 mm   Lens PC IOL in good position with open PC PC IOL in good position, 2-3+ Posterior capsular opacification with small central opening   Vitreous Vitreous syneresis Vitreous syneresis, Posterior vitreous detachment       Fundus Exam      Right Left   Disc Normal Normal, vertical cupping, inferior rim thinning   C/D Ratio 0.3 0.65   Macula Flat, Retinal pigment epithelial mottling, few MAs temporal to fovea, No edema Epiretinal membrane, Blunted foveal reflex and central cystic changes, Intraretinal hemorrhages   Vessels Tortuous - inferior>superior, mild Copper wiring Tortuous (sup > inf), AV crossing changes, Copper wiring, Dilation   Periphery Attached, scattered MAs Attached, scattered MAs        Refraction    Wearing Rx      Sphere Cylinder Axis Add   Right +1.50 +1.50 014 +2.50   Left +2.00 +1.00 178 +2.50       Manifest Refraction (Auto)      Sphere Cylinder Axis Dist VA   Right -0.25 +1.50 176    Left -0.25 +1.50 159        Manifest Refraction #2      Sphere Cylinder Axis Dist VA   Right -0.50 +1.00 175 20/20   Left -0.75 +1.50 160 20/25-2          IMAGING AND PROCEDURES  Imaging and Procedures for 04/20/17  OCT, Retina - OU - Both Eyes     Right Eye Quality was good. Central Foveal Thickness: 270. Progression has no prior data. Findings include normal foveal contour, no IRF, no SRF, epiretinal membrane.   Left Eye Quality was borderline. Central Foveal  Thickness: 420. Progression has no prior data. Findings include intraretinal fluid, epiretinal membrane, no SRF, abnormal foveal contour.   Notes *Images captured and stored on drive  Diagnosis / Impression:  OD: NFP, No IRF/SRF, trace ERM OS: central CME/IRF, No SRF, mild ERM  Clinical management:  See below  Abbreviations: NFP - Normal foveal profile. CME - cystoid macular edema. PED - pigment epithelial detachment. IRF - intraretinal fluid. SRF - subretinal fluid. EZ - ellipsoid zone. ERM - epiretinal membrane. ORA - outer retinal atrophy. ORT - outer retinal tubulation. SRHM - subretinal hyper-reflective material         Fluorescein Angiography Optos (Transit OS)     Right Eye Progression has no prior data. Early phase findings include microaneurysm. Mid/Late phase findings include leakage, microaneurysm.   Left Eye Progression has no prior data. Early phase findings include (Hazy view due to PCO). Mid/Late phase findings include leakage, microaneurysm.   Notes Impression:  OD: few microaneurysms; late leakage parafoveal region inf > sup OS: late leakage superior macula; scattered microaneurysms       Intravitreal Injection, Pharmacologic Agent - OS - Left Eye     Time Out 03/18/2017. 2:00 PM. Confirmed correct patient, procedure, site, and patient consented.   Anesthesia Topical anesthesia was used. Anesthetic medications included Lidocaine 2%.   Procedure Preparation included 5% betadine to ocular surface. A supplied needle was used.   Injection: 1.25 mg Bevacizumab 1.25mg /0.1ml  NDC: 47654-650-35    Lot: [id number]     Expiration Date: 04/19/2017   Route: Intravitreal   Site: Left Eye   Waste: 0 mg  Post-op Post injection exam found visual acuity of at least counting fingers. The patient tolerated the procedure well. There were no complications. The patient received written and verbal post procedure care education.                  ASSESSMENT/PLAN:    ICD-10-CM   1. Branch retinal vein occlusion of left eye with macular edema 05/02/2017 Fluorescein Angiography Optos (Transit OS)    Intravitreal Injection, Pharmacologic Agent - OS - Left Eye    Bevacizumab (AVASTIN) SOLN 1.25 mg  2. Retinal edema H35.81 OCT, Retina - OU - Both Eyes    Fluorescein Angiography Optos (Transit OS)  3. Essential hypertension I10   4. Hypertensive retinopathy of both eyes H35.033 Fluorescein Angiography Optos (Transit OS)  5. Posterior vitreous detachment of left eye H43.812   6. Pseudophakia of both eyes Z96.1   7. PCO (posterior capsular opacification), left H26.492     1,2. BRVO w/ macular edema OS - The natural history of retinal vein occlusion and macular edema and treatment options including observation, laser photocoagulation, and intravitreal antiVEGF injection with Avastin and Lucentis and Eylea and intravitreal injection of steroids with triamcinolone and Ozurdex and the complications of these procedures including loss of vision, infection, cataract, glaucoma, and retinal detachment were discussed with patient. - Specifically discussed findings from [place] / [place] study regarding patient stabilization with anti-VEGF agents and increased potential for visual improvements.  Also discussed need for frequent follow up and potentially multiple injections given the chronic nature of the disease process - on exam, superior temporal venule is dilated and tortuous but no significant intraretinal hemorrhages suggesting perhaps a remote BRVO event has occurred - BCVA 20/25 - OCT shows central cystic changes / IRF OS - FA shows significant leakage from branches from the superior temporal venules corresponding to central cystic changes noted on OCT - recommend IVA OS #1 today, 1.23.19 - RBA of procedure discussed, questions answered - informed consent obtained and signed - see procedure note - F/U 4 weeks -- DFE/OCT/possible injection  3,4.  Hypertensive retinopathy OU - discussed importance of tight BP control - monitor  5. PVD OS - Discussed findings and prognosis - No RT or RD on 360 peripheral exam - Reviewed s/s of RT/RD - Strict return precautions for any such RT/RD signs/symptoms  6,7. Pseudophakia OU w/ residual PCO OS - s/p CE/IOL OU 11 yrs ago at [facility] - s/p YAG capsulotomy OU - OS with small capsulotomy and residual PCO - now that she is s/p IVA OS, okay from a retina standpoint to proceed with YAG Cap OS with Dr[facility] at any time   Ophthalmic Meds Ordered this visit:  Meds ordered this encounter  Medications  . Bevacizumab (AVASTIN) SOLN 1.25 mg      Return in about 4 weeks (around 04/15/2017) for Dilated Exam, OCT, Possible Injxn.  There are no Patient Instructions on file for this visit.   Explained the diagnoses, plan, and follow up with the patient and they expressed understanding.  Patient expressed understanding of the importance of proper follow up care.   This document serves as a record of services personally performed by 04/28/2017, MD, PhD. It was created on their behalf by Gardiner Sleeper, [place], a certified ophthalmic assistant. The creation of this record is the  provider's dictation and/or activities during the visit.  Electronically signed by: Catha Brow, [place]  04/20/17 11:39 AM    Gardiner Sleeper, M.D., Ph.D. Diseases & Surgery of the Retina and Vitreous [place] 04/20/17   I have reviewed the above documentation for accuracy and completeness, and I agree with the above. Gardiner Sleeper, M.D., Ph.D. 04/20/17 11:39 AM    Abbreviations: M myopia (nearsighted); A astigmatism; H hyperopia (farsighted); P presbyopia; Mrx spectacle prescription;  CTL contact lenses; OD right eye; OS left eye; OU both eyes  XT exotropia; ET esotropia; PEK punctate epithelial keratitis; PEE punctate epithelial erosions; DES dry eye syndrome; MGD meibomian  gland dysfunction; ATs artificial tears; PFAT's preservative free artificial tears; [facility] nuclear sclerotic cataract; PSC posterior subcapsular cataract; ERM epi-retinal membrane; PVD posterior vitreous detachment; RD retinal detachment; DM diabetes mellitus; DR diabetic retinopathy; NPDR non-proliferative diabetic retinopathy; PDR proliferative diabetic retinopathy; CSME clinically significant macular edema; DME diabetic macular edema; dbh dot blot hemorrhages; CWS cotton wool spot; POAG primary open angle glaucoma; C/D cup-to-disc ratio; HVF humphrey visual field; GVF goldmann visual field; OCT optical coherence tomography; IOP intraocular pressure; BRVO Branch retinal vein occlusion; CRVO central retinal vein occlusion; CRAO central retinal artery occlusion; BRAO branch retinal artery occlusion; RT retinal tear; SB scleral buckle; PPV pars plana vitrectomy; VH Vitreous hemorrhage; PRP panretinal laser photocoagulation; IVK intravitreal kenalog; VMT vitreomacular traction; MH Macular hole;  NVD neovascularization of the disc; NVE neovascularization elsewhere; AREDS age related eye disease study; ARMD age related macular degeneration; POAG primary open angle glaucoma; EBMD epithelial/anterior basement membrane dystrophy; ACIOL anterior chamber intraocular lens; IOL intraocular lens; PCIOL posterior chamber intraocular lens; Phaco/IOL phacoemulsification with intraocular lens placement; Lib[place] Negron Torres photorefractive keratectomy; LASIK laser assisted in situ keratomileusis; HTN hypertension; DM diabetes mellitus; COPD chronic obstructive pulmonary disease

## 2017-03-18 ENCOUNTER — Encounter (INDEPENDENT_AMBULATORY_CARE_PROVIDER_SITE_OTHER): Payer: Self-pay | Admitting: Ophthalmology

## 2017-03-18 ENCOUNTER — Ambulatory Visit (INDEPENDENT_AMBULATORY_CARE_PROVIDER_SITE_OTHER): Payer: Medicare Other | Admitting: Ophthalmology

## 2017-03-18 DIAGNOSIS — Z961 Presence of intraocular lens: Secondary | ICD-10-CM

## 2017-03-18 DIAGNOSIS — H26492 Other secondary cataract, left eye: Secondary | ICD-10-CM

## 2017-03-18 DIAGNOSIS — I10 Essential (primary) hypertension: Secondary | ICD-10-CM | POA: Diagnosis not present

## 2017-03-18 DIAGNOSIS — H3581 Retinal edema: Secondary | ICD-10-CM | POA: Diagnosis not present

## 2017-03-18 DIAGNOSIS — H34832 Tributary (branch) retinal vein occlusion, left eye, with macular edema: Secondary | ICD-10-CM

## 2017-03-18 DIAGNOSIS — H35033 Hypertensive retinopathy, bilateral: Secondary | ICD-10-CM | POA: Diagnosis not present

## 2017-03-18 DIAGNOSIS — H43812 Vitreous degeneration, left eye: Secondary | ICD-10-CM

## 2017-04-02 DIAGNOSIS — H26492 Other secondary cataract, left eye: Secondary | ICD-10-CM | POA: Diagnosis not present

## 2017-04-15 ENCOUNTER — Encounter (INDEPENDENT_AMBULATORY_CARE_PROVIDER_SITE_OTHER): Payer: Medicare Other | Admitting: Ophthalmology

## 2017-04-17 NOTE — Progress Notes (Signed)
[facility] Note  04/20/2017     CHIEF COMPLAINT Patient presents for Retina Follow Up   HISTORY OF PRESENT ILLNESS: Martha Richard is a 84 y.o. female who presents to the clinic today for:   HPI    Retina Follow Up    Patient presents with  CRVO/BRVO.  In left eye.  This started 2 months ago.  Severity is moderate.  Since onset it is stable.  I, the attending physician,  performed the HPI with the patient and updated documentation appropriately.          Comments    F/U BRVO W/ MAC Edema Os. Patient states "she had laser tx Os last week by DR.Kathlen Mody, her vision is not as good as she would like it to be , she continues to floaters OS". Pt states she is ready for Avastin  today No new on set voiced. Denies gtt's/vits       Last edited by Bernarda Caffey, MD on 04/20/2017  2:25 PM. (History)    Pt states she is unsure if she is able to see any VA improvements from injection;   Referring physician: Manon Hilding, MD [place], [place] [address]  HISTORICAL INFORMATION:   Selected notes from the MEDICAL RECORD NUMBER Referred by Dr. Read Drivers for concern of cystic IRF OS;  LEE- 12.18.18 (C. Weaver) [BCVA OD: 20/30 OS: 20/50] Ocular Hx- pseudophakia OU, S/P YAG Cap OU PMH- HTN, former smoker    CURRENT MEDICATIONS: No current outpatient medications on file. (Ophthalmic Drugs)   No current facility-administered medications for this visit.  (Ophthalmic Drugs)   Current Outpatient Medications (Other)  Medication Sig  . chlorthalidone (HYGROTON) 25 MG tablet Take 6.25 mg by mouth daily as needed (elevated blood pressure). Takes 1/4 tablet only as needed for high blood pressure   . hydrochlorothiazide (HYDRODIURIL) 12.5 MG tablet Take 12.5 mg by mouth daily.  Marland Kitchen trolamine salicylate (ASPERCREME) 10 % cream Apply 1 application topically daily as needed for muscle pain.   Current Facility-Administered Medications (Other)  Medication Route  .  Bevacizumab (AVASTIN) SOLN 1.25 mg Intravitreal  . Bevacizumab (AVASTIN) SOLN 1.25 mg Intravitreal      REVIEW OF SYSTEMS: ROS    Positive for: Cardiovascular, Eyes   Negative for: Constitutional, Gastrointestinal, Neurological, Skin, Genitourinary, Musculoskeletal, HENT, Endocrine, Respiratory, Psychiatric, Allergic/Imm, Heme/Lymph   Last edited by Zenovia Jordan, LPN on 5/63/8756  4:33 PM. (History)       ALLERGIES Allergies  Allergen Reactions  . Ace Inhibitors Cough  . Caffeine Other (See Comments)    "shoots my BP up"  . Nsaids Other (See Comments)    GI upset    . Peanut-Containing Drug Products Other (See Comments)    headaches  . Ultram [Tramadol Hcl] Other (See Comments)    Headaches. Patient is unsure of this.  . Penicillins Itching and Rash    Has patient had a PCN reaction causing immediate rash, facial/tongue/throat swelling, SOB or lightheadedness with hypotension: No Has patient had a PCN reaction causing severe rash involving mucus membranes or skin necrosis: No Has patient had a PCN reaction that required hospitalization No Has patient had a PCN reaction occurring within the last 10 years: No If all of the above answers are "NO", then may proceed with Cephalosporin use.     PAST MEDICAL HISTORY Past Medical History:  Diagnosis Date  . Arrhythmia    palpitations  . Arthritis   .  Cancer (HCC)    squamous cell Left hand  . Complication of anesthesia    slow to wake up  . Headache    hx of migraines  . Hypertension    Past Surgical History:  Procedure Laterality Date  . APPENDECTOMY    . CATARACT EXTRACTION     OU about 10 yrs ago  . fibroadenoma left breast removal    . OTHER SURGICAL HISTORY     appendectomy  . TOTAL HIP ARTHROPLASTY Left 06/18/2016   Procedure: LEFT TOTAL HIP ARTHROPLASTY ANTERIOR APPROACH;  Surgeon: Gaynelle Arabian, MD;  Location: [facility];  Service: Orthopedics;  Laterality: Left;  . UTERINE FIBROID SURGERY      FAMILY  HISTORY Family History  Problem Relation Age of Onset  . Heart disease Mother   . Heart attack Mother   . Heart failure Mother   . Stroke Father   . Heart attack Maternal Aunt   . Heart disease Maternal Aunt   . Heart failure Maternal Aunt   . Amblyopia Neg Hx   . Blindness Neg Hx   . Cataracts Neg Hx   . Glaucoma Neg Hx   . Macular degeneration Neg Hx   . Retinal detachment Neg Hx   . Strabismus Neg Hx   . Retinitis pigmentosa Neg Hx     SOCIAL HISTORY Social History   Tobacco Use  . Smoking status: Former Research scientist (life sciences)  . Smokeless tobacco: Never Used  . Tobacco comment: years ago 50 plus years  Substance Use Topics  . Alcohol use: No  . Drug use: No         OPHTHALMIC EXAM:  Base Eye Exam    Visual Acuity (Snellen - Linear)      Right Left   Dist [place] 20/25 -2 20/50   Dist ph [place] 20/20 -1 20/30 -1       Tonometry (Tonopen, 1:30 PM)      Right Left   Pressure 15 10       Pupils      Dark Light Shape React APD   Right 2 1 Round Brisk None   Left 2 1 Round Brisk None       Visual Fields (Counting fingers)      Left Right    Full Full       Extraocular Movement      Right Left    Full, Ortho Full, Ortho       Neuro/Psych    Oriented x3:  Yes   Mood/Affect:  Normal       Dilation    Both eyes:  1.0% Mydriacyl, 2.5% Phenylephrine @ 1:30 PM        Slit Lamp and Fundus Exam    Slit Lamp Exam      Right Left   Lids/Lashes Dermatochalasis - upper lid Dermatochalasis - upper lid   Conjunctiva/Sclera White and quiet White and quiet   Cornea Arcus, nasal and temporal LRI, irregular epithelium inferiorly Irregular epithelium inferiorly, 1+ Punctate epithelial erosions, Arcus, nasal and temporal LRI   Anterior Chamber Deep and quiet Deep and quiet, no cell or flare   Iris Round and moderately dilated to 5.5 mm Round and moderately dilated to 5.5 mm   Lens PC IOL in good position with open PC PC IOL in good position, open PC   Vitreous Vitreous syneresis  Vitreous syneresis, Posterior vitreous detachment       Fundus Exam      Right Left  Disc Normal vertical cupping, inferior rim thinning, vascular loops temporally   C/D Ratio 0.3 0.65   Macula Flat, Retinal pigment epithelial mottling, few MAs temporal to fovea, No edema Epiretinal membrane, Blunted foveal reflex and central cystic changes, Intraretinal hemorrhages - slightly improved   Vessels Tortuous - inferior>superior, mild Copper wiring Tortuous (sup > inf), AV crossing changes, Copper wiring, Dilation   Periphery Attached, scattered MAs Attached, scattered MAs          IMAGING AND PROCEDURES  Imaging and Procedures for 04/20/17  OCT, Retina - OU - Both Eyes     Right Eye Quality was good. Central Foveal Thickness: 283. Progression has been stable. Findings include normal foveal contour, no IRF, no SRF, epiretinal membrane (Trace cystic change).   Left Eye Quality was poor. Central Foveal Thickness: 284. Progression has improved. Findings include intraretinal fluid, epiretinal membrane, no SRF, normal foveal contour.   Notes *Images captured and stored on drive  Diagnosis / Impression:  OD: NFP, No IRF/SRF, trace ERM OS: central CME/IRF, No SRF, mild ERM - interval improvement in IRF  Clinical management:  See below  Abbreviations: NFP - Normal foveal profile. CME - cystoid macular edema. PED - pigment epithelial detachment. IRF - intraretinal fluid. SRF - subretinal fluid. EZ - ellipsoid zone. ERM - epiretinal membrane. ORA - outer retinal atrophy. ORT - outer retinal tubulation. SRHM - subretinal hyper-reflective material         Intravitreal Injection, Pharmacologic Agent - OS - Left Eye     Time Out 04/20/2017. 2:37 PM. Confirmed correct patient, procedure, site, and patient consented.   Anesthesia Topical anesthesia was used. Anesthetic medications included Tetracaine 0.5%, Lidocaine 2%.   Procedure Preparation included 5% betadine to ocular surface,  eyelid speculum. A supplied needle was used.   Injection: 1.25 mg Bevacizumab 1.54m/0.05ml   NDC: 562831-517-61   Lot: [id number]    Expiration Date: 06/10/2017   Route: Intravitreal   Site: Left Eye   Waste: 0 mg  Post-op Post injection exam found visual acuity of at least counting fingers. The patient tolerated the procedure well. There were no complications. The patient received written and verbal post procedure care education.                 ASSESSMENT/PLAN:    ICD-10-CM   1. Branch retinal vein occlusion of left eye with macular edema H34.8320 Intravitreal Injection, Pharmacologic Agent - OS - Left Eye    Bevacizumab (AVASTIN) SOLN 1.25 mg  2. Retinal edema H35.81 OCT, Retina - OU - Both Eyes  3. Essential hypertension I10   4. Hypertensive retinopathy of both eyes H35.033   5. Posterior vitreous detachment of left eye H43.812   6. Pseudophakia of both eyes Z96.1   7. PCO (posterior capsular opacification), left H26.492     1,2. BRVO w/ macular edema OS - The natural history of retinal vein occlusion and macular edema and treatment options including observation, laser photocoagulation, and intravitreal antiVEGF injection with Avastin and Lucentis and Eylea and intravitreal injection of steroids with triamcinolone and Ozurdex and the complications of these procedures including loss of vision, infection, cataract, glaucoma, and retinal detachment were discussed with patient. - Specifically discussed findings from CDe Soto/ BCarverstudy regarding patient stabilization with anti-VEGF agents and increased potential for visual improvements.  Also discussed need for frequent follow up and potentially multiple injections given the chronic nature of the disease process - on exam, superior temporal venule is dilated and tortuous but  no significant intraretinal hemorrhages suggesting perhaps a remote BRVO event has occurred - FA at presentation (01.23.19) shows significant leakage  from branches from the superior temporal venules corresponding to central cystic changes noted on OCT - S/P IVA OS #1 (01.23.19) - OCT today shows central cystic changes / IRF OS - interval improvement in IRF - recommend IVA OS #2 today (02.25.19) - RBA of procedure discussed, questions answered - informed consent obtained and signed - see procedure note - F/U 4 weeks -- DFE/OCT/possible injection  3,4. Hypertensive retinopathy OU - discussed importance of tight BP control - monitor  5. PVD OS - Discussed findings and prognosis - No RT or RD on 360 peripheral exam - Reviewed s/s of RT/RD - Strict return precautions for any such RT/RD signs/symptoms  6,7. Pseudophakia OU w/ residual PCO OS - s/p CE/IOL OU [facility] yrs ago at [facility] - s/p YAG capsulotomy OU - OS re-YAG'd by Dr. Kathlen Mody -- PC opening improved   Ophthalmic Meds Ordered this visit:  Meds ordered this encounter  Medications  . Bevacizumab (AVASTIN) SOLN 1.25 mg      Return in about 4 weeks (around 05/18/2017) for F/U BRVO w/ ME OS.  There are no Patient Instructions on file for this visit.   Explained the diagnoses, plan, and follow up with the patient and they expressed understanding.  Patient expressed understanding of the importance of proper follow up care.   This document serves as a record of services personally performed by Gardiner Sleeper, MD, PhD. It was created on their behalf by Catha Brow, [place], a certified ophthalmic assistant. The creation of this record is the provider's dictation and/or activities during the visit.  Electronically signed by: Catha Brow, COA  04/20/17 4:06 PM    Gardiner Sleeper, M.D., Ph.D. Diseases & Surgery of the Retina and [place] 04/20/17  I have reviewed the above documentation for accuracy and completeness, and I agree with the above. Gardiner Sleeper, M.D., Ph.D. 04/20/17 4:06 PM     Abbreviations: M myopia  (nearsighted); A astigmatism; H hyperopia (farsighted); P presbyopia; Mrx spectacle prescription;  CTL contact lenses; OD right eye; OS left eye; OU both eyes  XT exotropia; ET esotropia; PEK punctate epithelial keratitis; PEE punctate epithelial erosions; DES dry eye syndrome; MGD meibomian gland dysfunction; ATs artificial tears; PFAT's preservative free artificial tears; [place]ke nuclear sclerotic cataract; PSC posterior subcapsular cataract; ERM epi-retinal membrane; PVD posterior vitreous detachment; RD retinal detachment; DM diabetes mellitus; DR diabetic retinopathy; NPDR non-proliferative diabetic retinopathy; PDR proliferative diabetic retinopathy; CSME clinically significant macular edema; DME diabetic macular edema; dbh dot blot hemorrhages; CWS cotton wool spot; POAG primary open angle glaucoma; C/D cup-to-disc ratio; HVF humphrey visual field; GVF goldmann visual field; OCT optical coherence tomography; IOP intraocular pressure; BRVO Branch retinal vein occlusion; CRVO central retinal vein occlusion; CRAO central retinal artery occlusion; BRAO branch retinal artery occlusion; RT retinal tear; SB scleral buckle; PPV pars plana vitrectomy; VH Vitreous hemorrhage; PRP panretinal laser photocoagulation; IVK intravitreal kenalog; VMT vitreomacular traction; MH Macular hole;  NVD neovascularization of the disc; NVE neovascularization elsewhere; AREDS age related eye disease study; ARMD age related macular degeneration; POAG primary open angle glaucoma; EBMD epithelial/anterior basement membrane dystrophy; ACIOL anterior chamber intraocular lens; IOL intraocular lens; PCIOL posterior chamber intraocular lens; Phaco/IOL phacoemulsification with intraocular lens placement; [place] photorefractive keratectomy; LASIK laser assisted in situ keratomileusis; HTN hypertension; DM diabetes mellitus; COPD chronic obstructive pulmonary disease

## 2017-04-20 ENCOUNTER — Encounter (INDEPENDENT_AMBULATORY_CARE_PROVIDER_SITE_OTHER): Payer: Self-pay | Admitting: Ophthalmology

## 2017-04-20 ENCOUNTER — Ambulatory Visit (INDEPENDENT_AMBULATORY_CARE_PROVIDER_SITE_OTHER): Payer: Medicare Other | Admitting: Ophthalmology

## 2017-04-20 DIAGNOSIS — H26492 Other secondary cataract, left eye: Secondary | ICD-10-CM

## 2017-04-20 DIAGNOSIS — I10 Essential (primary) hypertension: Secondary | ICD-10-CM | POA: Diagnosis not present

## 2017-04-20 DIAGNOSIS — H35033 Hypertensive retinopathy, bilateral: Secondary | ICD-10-CM | POA: Diagnosis not present

## 2017-04-20 DIAGNOSIS — H34832 Tributary (branch) retinal vein occlusion, left eye, with macular edema: Secondary | ICD-10-CM | POA: Diagnosis not present

## 2017-04-20 DIAGNOSIS — H3581 Retinal edema: Secondary | ICD-10-CM

## 2017-04-20 DIAGNOSIS — Z961 Presence of intraocular lens: Secondary | ICD-10-CM

## 2017-04-20 DIAGNOSIS — H43812 Vitreous degeneration, left eye: Secondary | ICD-10-CM | POA: Diagnosis not present

## 2017-04-20 MED ORDER — BEVACIZUMAB CHEMO INJECTION 1.25MG/0.05ML SYRINGE FOR KALEIDOSCOPE
1.2500 mg | INTRAVITREAL | Status: AC
  Administered 2017-04-20: 1.25 mg via INTRAVITREAL

## 2017-04-27 IMAGING — NM NM MYOCAR MULTI W/SPECT W/WALL MOTION & EF
2 series · 12 of 12 positions shown · non-contrast
Comparison: none

[Series 1: rest · 8.28mm/px · 6 of 64 frames shown]
[frame 6/64]
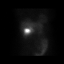
[frame 16/64]
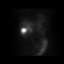
[frame 27/64]
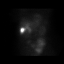
[frame 38/64]
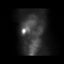
[frame 48/64]
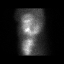
[frame 59/64]
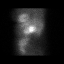

[Series 2: stress gated · 8.28mm/px · 6 of 64 frames shown]
[frame 6/64]
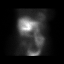
[frame 16/64]
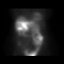
[frame 27/64]
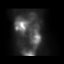
[frame 38/64]
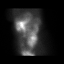
[frame 48/64]
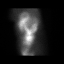
[frame 59/64]
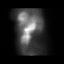

[12 of 12 positions shown; findings below may reference images not displayed]

Canned report from images found in remote index.

Refer to host system for actual result text.

## 2017-04-30 DIAGNOSIS — Z961 Presence of intraocular lens: Secondary | ICD-10-CM | POA: Diagnosis not present

## 2017-04-30 DIAGNOSIS — H353222 Exudative age-related macular degeneration, left eye, with inactive choroidal neovascularization: Secondary | ICD-10-CM | POA: Diagnosis not present

## 2017-04-30 DIAGNOSIS — H524 Presbyopia: Secondary | ICD-10-CM | POA: Diagnosis not present

## 2017-05-21 NOTE — Progress Notes (Signed)
[facility] Note  05/25/2017     CHIEF COMPLAINT Patient presents for Retina Follow Up   HISTORY OF PRESENT ILLNESS: Martha Richard is a 84 y.o. female who presents to the clinic today for:   HPI    Retina Follow Up    Patient presents with  CRVO/BRVO.  In left eye.  Severity is moderate.  Duration of 4 weeks.  Since onset it is stable.  I, the attending physician,  performed the HPI with the patient and updated documentation appropriately.          Comments    Pt presents today for F/U for BRVO with ME OS, pt states VA has been "okay" sine last visit, states she is seeing floater OS, pt states last time she was here she went to the bathroom before she left and saw a large cluster of black spots-about the size of a quarter-but she has not seen them since       Last edited by Bernarda Caffey, MD on 05/25/2017  1:41 PM. (History)    Pt states she is unsure if she is able to see any VA improvements from injection;   Referring physician: Manon Hilding, MD [place], [place] [address]  HISTORICAL INFORMATION:   Selected notes from the MEDICAL RECORD NUMBER Referred by Dr. Read Drivers for concern of cystic IRF OS;  LEE- 12.18.18 (C. Weaver) [BCVA OD: 20/30 OS: 20/50] Ocular Hx- pseudophakia OU, S/P YAG Cap OU PMH- HTN, former smoker    CURRENT MEDICATIONS: No current outpatient medications on file. (Ophthalmic Drugs)   No current facility-administered medications for this visit.  (Ophthalmic Drugs)   Current Outpatient Medications (Other)  Medication Sig  . chlorthalidone (HYGROTON) 25 MG tablet Take 6.25 mg by mouth daily as needed (elevated blood pressure). Takes 1/4 tablet only as needed for high blood pressure   . hydrochlorothiazide (HYDRODIURIL) 12.5 MG tablet Take 12.5 mg by mouth daily.  Marland Kitchen trolamine salicylate (ASPERCREME) 10 % cream Apply 1 application topically daily as needed for muscle pain.   Current Facility-Administered  Medications (Other)  Medication Route  . Bevacizumab (AVASTIN) SOLN 1.25 mg Intravitreal  . Bevacizumab (AVASTIN) SOLN 1.25 mg Intravitreal  . Bevacizumab (AVASTIN) SOLN 1.25 mg Intravitreal      REVIEW OF SYSTEMS: ROS    Positive for: Musculoskeletal, Cardiovascular, Eyes   Negative for: Constitutional, Gastrointestinal, Neurological, Skin, Genitourinary, HENT, Endocrine, Respiratory, Psychiatric, Allergic/Imm, Heme/Lymph   Last edited by Debbrah Alar, COT on 05/25/2017  1:17 PM. (History)       ALLERGIES Allergies  Allergen Reactions  . Ace Inhibitors Cough  . Caffeine Other (See Comments)    "shoots my BP up"  . Nsaids Other (See Comments)    GI upset    . Peanut-Containing Drug Products Other (See Comments)    headaches  . Ultram [Tramadol Hcl] Other (See Comments)    Headaches. Patient is unsure of this.  . Penicillins Itching and Rash    Has patient had a PCN reaction causing immediate rash, facial/tongue/throat swelling, SOB or lightheadedness with hypotension: No Has patient had a PCN reaction causing severe rash involving mucus membranes or skin necrosis: No Has patient had a PCN reaction that required hospitalization No Has patient had a PCN reaction occurring within the last 10 years: No If all of the above answers are "NO", then may proceed with Cephalosporin use.     PAST MEDICAL HISTORY Past Medical History:  Diagnosis Date  . Arrhythmia    palpitations  . Arthritis   . Cancer (HCC)    squamous cell Left hand  . Complication of anesthesia    slow to wake up  . Headache    hx of migraines  . Hypertension    Past Surgical History:  Procedure Laterality Date  . APPENDECTOMY    . CATARACT EXTRACTION     OU about 10 yrs ago  . fibroadenoma left breast removal    . OTHER SURGICAL HISTORY     appendectomy  . TOTAL HIP ARTHROPLASTY Left 06/18/2016   Procedure: LEFT TOTAL HIP ARTHROPLASTY ANTERIOR APPROACH;  Surgeon: Gaynelle Arabian, MD;  Location: [facility]  [facility];  Service: Orthopedics;  Laterality: Left;  . UTERINE FIBROID SURGERY      FAMILY HISTORY Family History  Problem Relation Age of Onset  . Heart disease Mother   . Heart attack Mother   . Heart failure Mother   . Stroke Father   . Heart attack Maternal Aunt   . Heart disease Maternal Aunt   . Heart failure Maternal Aunt   . Amblyopia Neg Hx   . Blindness Neg Hx   . Cataracts Neg Hx   . Glaucoma Neg Hx   . Macular degeneration Neg Hx   . Retinal detachment Neg Hx   . Strabismus Neg Hx   . Retinitis pigmentosa Neg Hx     SOCIAL HISTORY Social History   Tobacco Use  . Smoking status: Former Research scientist (life sciences)  . Smokeless tobacco: Never Used  . Tobacco comment: years ago 50 plus years  Substance Use Topics  . Alcohol use: No  . Drug use: No         OPHTHALMIC EXAM:  Base Eye Exam    Visual Acuity (Snellen - Linear)      Right Left   Dist cc 20/20 20/60   Dist ph cc  20/40   Correction:  Glasses       Tonometry (Tonopen, 1:08 PM)      Right Left   Pressure 16 21       Pupils      Dark Light Shape React APD   Right 1.5 1 Round Minimal None   Left 1.5 1 Round Minimal None       Visual Fields (Counting fingers)      Left Right    Full Full       Extraocular Movement      Right Left    Full, Ortho Full, Ortho       Neuro/Psych    Oriented x3:  Yes   Mood/Affect:  Normal       Dilation    Both eyes:  1.0% Mydriacyl, 2.5% Phenylephrine @ 1:08 PM        Slit Lamp and Fundus Exam    Slit Lamp Exam      Right Left   Lids/Lashes Dermatochalasis - upper lid, Meibomian gland dysfunction Dermatochalasis - upper lid, Meibomian gland dysfunction   Conjunctiva/Sclera White and quiet White and quiet   Cornea Arcus, nasal and temporal LRI, irregular epithelium inferiorly, 1+ Punctate epithelial erosions Irregular epithelium inferiorly, 1+ Punctate epithelial erosions, Arcus, nasal and temporal LRI   Anterior Chamber Deep and quiet Deep and quiet, no cell or  flare   Iris Round and moderately dilated to 5.5 mm Round and moderately dilated to 5.5 mm   Lens PC IOL in good position with open PC PC IOL in good position, open PC  Vitreous Vitreous syneresis Vitreous syneresis, Posterior vitreous detachment       Fundus Exam      Right Left   Disc Normal vertical cupping, inferior rim thinning, vascular loops temporally   C/D Ratio 0.3 0.65   Macula Flat, Retinal pigment epithelial mottling, few MAs temporal to fovea, No edema Epiretinal membrane, Blunted foveal reflex - improved, central cystic changes, Intraretinal hemorrhages - improved   Vessels Tortuous - inferior>superior, mild Copper wiring Tortuous (sup > inf), AV crossing changes, Copper wiring, Dilation   Periphery Attached, scattered MAs Attached, scattered MAs          IMAGING AND PROCEDURES  Imaging and Procedures for 05/25/17  OCT, Retina - OU - Both Eyes       Right Eye Quality was good. Central Foveal Thickness: 274. Progression has been stable. Findings include normal foveal contour, no IRF, no SRF, epiretinal membrane (Trace cystic change).   Left Eye Quality was poor. Central Foveal Thickness: 309. Progression has been stable. Findings include intraretinal fluid, epiretinal membrane, no SRF, normal foveal contour.   Notes *Images captured and stored on drive  Diagnosis / Impression:  OD: NFP, No IRF/SRF, trace ERM OS: central CME/IRF improved, No SRF, mild ERM  Clinical management:  See below  Abbreviations: NFP - Normal foveal profile. CME - cystoid macular edema. PED - pigment epithelial detachment. IRF - intraretinal fluid. SRF - subretinal fluid. EZ - ellipsoid zone. ERM - epiretinal membrane. ORA - outer retinal atrophy. ORT - outer retinal tubulation. SRHM - subretinal hyper-reflective material         Intravitreal Injection, Pharmacologic Agent - OS - Left Eye       Time Out 05/25/2017. 1:45 PM. Confirmed correct patient, procedure, site, and patient  consented.   Anesthesia Topical anesthesia was used. Anesthetic medications included Tetracaine 0.5%, Lidocaine 2%.   Procedure Preparation included 5% betadine to ocular surface, eyelid speculum. A supplied needle was used.   Injection: 1.25 mg Bevacizumab 1.25mg /0.58ml   NDC: 98921-194-17    Lot: [id number]     Expiration Date: 07/09/2017   Route: Intravitreal   Site: Left Eye   Waste: 0 mg  Post-op Post injection exam found visual acuity of at least counting fingers. The patient tolerated the procedure well. There were no complications. The patient received written and verbal post procedure care education.                 ASSESSMENT/PLAN:    ICD-10-CM   1. Branch retinal vein occlusion of left eye with macular edema H34.8320 Intravitreal Injection, Pharmacologic Agent - OS - Left Eye    Bevacizumab (AVASTIN) SOLN 1.25 mg  2. Retinal edema H35.81 OCT, Retina - OU - Both Eyes  3. Essential hypertension I10   4. Hypertensive retinopathy of both eyes H35.033   5. Posterior vitreous detachment of left eye H43.812   6. Pseudophakia of both eyes Z96.1   7. PCO (posterior capsular opacification), left H26.492     1,2. BRVO w/ macular edema OS - on presenting exam, superior temporal venule was dilated and tortuous but no significant intraretinal hemorrhages suggesting perhaps a remote BRVO event has occurred - FA at presentation (01.23.19) shows significant leakage from branches from the superior temporal venules corresponding to central cystic changes noted on OCT - S/P IVA OS #1 (01.23.19), #2 (02.25.19) - OCT today shows interval improvement in IRF - BCVA 20/40, today -- feels like she sees better without glasses - recommend IVA OS #3 today (04.01.19), will  extend interval to 5 weeks - RBA of procedure discussed, questions answered - informed consent obtained and signed - see procedure note - F/U 5 weeks -- DFE/OCT/possible injection  3,4. Hypertensive retinopathy  OU - discussed importance of tight BP control - monitor  5. PVD OS - Discussed findings and prognosis - No RT or RD on 360 peripheral exam - Reviewed s/s of RT/RD - Strict return precautions for any such RT/RD signs/symptoms  6,7. Pseudophakia OU w/ residual PCO OS - s/p CE/IOL OU [facility] yrs ago at [facility] - s/p YAG capsulotomy OU - OS re-YAG'd by Dr. Kathlen Mody -- PC opening improved   Ophthalmic Meds Ordered this visit:  Meds ordered this encounter  Medications  . Bevacizumab (AVASTIN) SOLN 1.25 mg      Return in about 5 weeks (around 06/29/2017) for 5 wks, Dilated Exam, OCT -- f/u BRVO w/ ME OS.  There are no Patient Instructions on file for this visit.   Explained the diagnoses, plan, and follow up with the patient and they expressed understanding.  Patient expressed understanding of the importance of proper follow up care.   This document serves as a record of services personally performed by Gardiner Sleeper, MD, PhD. It was created on their behalf by Catha Brow, [place], a certified ophthalmic assistant. The creation of this record is the provider's dictation and/or activities during the visit.  Electronically signed by: Catha Brow, COA  05/25/17 1:45 PM   Gardiner Sleeper, M.D., Ph.D. Diseases & Surgery of the Retina and [place] 05/25/17   I have reviewed the above documentation for accuracy and completeness, and I agree with the above. Gardiner Sleeper, M.D., Ph.D. 05/25/17 1:45 PM     Abbreviations: M myopia (nearsighted); A astigmatism; H hyperopia (farsighted); P presbyopia; Mrx spectacle prescription;  CTL contact lenses; OD right eye; OS left eye; OU both eyes  XT exotropia; ET esotropia; PEK punctate epithelial keratitis; PEE punctate epithelial erosions; DES dry eye syndrome; MGD meibomian gland dysfunction; ATs artificial tears; PFAT's preservative free artificial tears; Berger nuclear sclerotic cataract; PSC posterior  subcapsular cataract; ERM epi-retinal membrane; PVD posterior vitreous detachment; RD retinal detachment; DM diabetes mellitus; DR diabetic retinopathy; NPDR non-proliferative diabetic retinopathy; PDR proliferative diabetic retinopathy; CSME clinically significant macular edema; DME diabetic macular edema; dbh dot blot hemorrhages; CWS cotton wool spot; POAG primary open angle glaucoma; C/D cup-to-disc ratio; HVF humphrey visual field; GVF goldmann visual field; OCT optical coherence tomography; IOP intraocular pressure; BRVO Branch retinal vein occlusion; CRVO central retinal vein occlusion; CRAO central retinal artery occlusion; BRAO branch retinal artery occlusion; RT retinal tear; SB scleral buckle; PPV pars plana vitrectomy; VH Vitreous hemorrhage; PRP panretinal laser photocoagulation; IVK intravitreal kenalog; VMT vitreomacular traction; MH Macular hole;  NVD neovascularization of the disc; NVE neovascularization elsewhere; AREDS age related eye disease study; ARMD age related macular degeneration; POAG primary open angle glaucoma; EBMD epithelial/anterior basement membrane dystrophy; ACIOL anterior chamber intraocular lens; IOL intraocular lens; PCIOL posterior chamber intraocular lens; Phaco/IOL phacoemulsification with intraocular lens placement; [place] photorefractive keratectomy; LASIK laser assisted in situ keratomileusis; HTN hypertension; DM diabetes mellitus; COPD chronic obstructive pulmonary disease

## 2017-05-25 ENCOUNTER — Ambulatory Visit (INDEPENDENT_AMBULATORY_CARE_PROVIDER_SITE_OTHER): Payer: Medicare Other | Admitting: Ophthalmology

## 2017-05-25 ENCOUNTER — Telehealth: Payer: Self-pay | Admitting: Cardiovascular Disease

## 2017-05-25 ENCOUNTER — Encounter (INDEPENDENT_AMBULATORY_CARE_PROVIDER_SITE_OTHER): Payer: Self-pay | Admitting: Ophthalmology

## 2017-05-25 DIAGNOSIS — I10 Essential (primary) hypertension: Secondary | ICD-10-CM

## 2017-05-25 DIAGNOSIS — H43812 Vitreous degeneration, left eye: Secondary | ICD-10-CM | POA: Diagnosis not present

## 2017-05-25 DIAGNOSIS — H35033 Hypertensive retinopathy, bilateral: Secondary | ICD-10-CM

## 2017-05-25 DIAGNOSIS — Z961 Presence of intraocular lens: Secondary | ICD-10-CM

## 2017-05-25 DIAGNOSIS — H26492 Other secondary cataract, left eye: Secondary | ICD-10-CM | POA: Diagnosis not present

## 2017-05-25 DIAGNOSIS — H3581 Retinal edema: Secondary | ICD-10-CM

## 2017-05-25 DIAGNOSIS — H34832 Tributary (branch) retinal vein occlusion, left eye, with macular edema: Secondary | ICD-10-CM

## 2017-05-25 MED ORDER — BEVACIZUMAB CHEMO INJECTION 1.25MG/0.05ML SYRINGE FOR KALEIDOSCOPE
1.2500 mg | INTRAVITREAL | Status: AC
  Administered 2017-05-25: 1.25 mg via INTRAVITREAL

## 2017-05-25 NOTE — Telephone Encounter (Signed)
Echo scheduled [facility] 06/17/17

## 2017-05-28 ENCOUNTER — Other Ambulatory Visit: Payer: Medicare Other

## 2017-06-04 DIAGNOSIS — M1611 Unilateral primary osteoarthritis, right hip: Secondary | ICD-10-CM | POA: Diagnosis not present

## 2017-06-04 DIAGNOSIS — Z96643 Presence of artificial hip joint, bilateral: Secondary | ICD-10-CM | POA: Diagnosis not present

## 2017-06-04 DIAGNOSIS — M1612 Unilateral primary osteoarthritis, left hip: Secondary | ICD-10-CM | POA: Diagnosis not present

## 2017-06-04 DIAGNOSIS — Z471 Aftercare following joint replacement surgery: Secondary | ICD-10-CM | POA: Diagnosis not present

## 2017-06-17 ENCOUNTER — Other Ambulatory Visit: Payer: Medicare Other

## 2017-06-29 NOTE — Progress Notes (Signed)
[facility] Note  06/30/2017     CHIEF COMPLAINT Patient presents for Retina Follow Up   HISTORY OF PRESENT ILLNESS: Martha Richard is a 84 y.o. female who presents to the clinic today for:   HPI    Retina Follow Up    Patient presents with  Other.  In left eye.  This started 3.5 months ago.  Severity is moderate.  Duration of 2.5 months.  Since onset it is stable.  I, the attending physician,  performed the HPI with the patient and updated documentation appropriately.          Comments    53 female pt here for 5 wk f/u for BRVO OS w/macular edema.  S/P Avastin OS.  No change in vision ou, although she feels like she is wearing her glasses more.  Denies pain, flashes, but c/o floaters ou.  AT qid ou.       Last edited by Bernarda Caffey, MD on 07/01/2017 11:49 PM. (History)    Pt states she is unsure if she is able to see any VA improvements from injection;   Referring physician: Manon Hilding, MD [place], [place] [address]  HISTORICAL INFORMATION:   Selected notes from the MEDICAL RECORD NUMBER Referred by Dr. Read Drivers for concern of cystic IRF OS;  LEE- 12.18.18 (C. Weaver) [BCVA OD: 20/30 OS: 20/50] Ocular Hx- pseudophakia OU, S/P YAG Cap OU PMH- HTN, former smoker    CURRENT MEDICATIONS: No current outpatient medications on file. (Ophthalmic Drugs)   No current facility-administered medications for this visit.  (Ophthalmic Drugs)   Current Outpatient Medications (Other)  Medication Sig  . hydrochlorothiazide (HYDRODIURIL) 12.5 MG tablet Take 12.5 mg by mouth daily.  Marland Kitchen trolamine salicylate (ASPERCREME) 10 % cream Apply 1 application topically daily as needed for muscle pain.  . chlorthalidone (HYGROTON) 25 MG tablet Take 6.25 mg by mouth daily as needed (elevated blood pressure). Takes 1/4 tablet only as needed for high blood pressure   . hydrochlorothiazide (HYDRODIURIL) 12.5 MG tablet hydrochlorothiazide 12.5 mg tablet   Current  Facility-Administered Medications (Other)  Medication Route  . Bevacizumab (AVASTIN) SOLN 1.25 mg Intravitreal  . Bevacizumab (AVASTIN) SOLN 1.25 mg Intravitreal  . Bevacizumab (AVASTIN) SOLN 1.25 mg Intravitreal  . Bevacizumab (AVASTIN) SOLN 1.25 mg Intravitreal      REVIEW OF SYSTEMS: ROS    Positive for: Eyes   Negative for: Constitutional, Gastrointestinal, Neurological, Skin, Genitourinary, Musculoskeletal, HENT, Endocrine, Cardiovascular, Respiratory, Psychiatric, Allergic/Imm, Heme/Lymph   Last edited by Matthew Folks, COA on 06/30/2017 12:53 PM. (History)       ALLERGIES Allergies  Allergen Reactions  . Ace Inhibitors Cough  . Caffeine Other (See Comments)    "shoots my BP up"  . Nsaids Other (See Comments)    GI upset    . Peanut-Containing Drug Products Other (See Comments)    headaches  . Ultram [Tramadol Hcl] Other (See Comments)    Headaches. Patient is unsure of this.  . Penicillins Itching and Rash    Has patient had a PCN reaction causing immediate rash, facial/tongue/throat swelling, SOB or lightheadedness with hypotension: No Has patient had a PCN reaction causing severe rash involving mucus membranes or skin necrosis: No Has patient had a PCN reaction that required hospitalization No Has patient had a PCN reaction occurring within the last 10 years: No If all of the above answers are "NO", then may proceed with Cephalosporin use.  PAST MEDICAL HISTORY Past Medical History:  Diagnosis Date  . Arrhythmia    palpitations  . Arthritis   . Cancer (HCC)    squamous cell Left hand  . Complication of anesthesia    slow to wake up  . Headache    hx of migraines  . Hypertension    Past Surgical History:  Procedure Laterality Date  . APPENDECTOMY    . CATARACT EXTRACTION     OU about 10 yrs ago  . fibroadenoma left breast removal    . OTHER SURGICAL HISTORY     appendectomy  . TOTAL HIP ARTHROPLASTY Left 06/18/2016   Procedure: LEFT TOTAL HIP  ARTHROPLASTY ANTERIOR APPROACH;  Surgeon: Gaynelle Arabian, MD;  Location: [facility];  Service: Orthopedics;  Laterality: Left;  . UTERINE FIBROID SURGERY      FAMILY HISTORY Family History  Problem Relation Age of Onset  . Heart disease Mother   . Heart attack Mother   . Heart failure Mother   . Stroke Father   . Heart attack Maternal Aunt   . Heart disease Maternal Aunt   . Heart failure Maternal Aunt   . Amblyopia Neg Hx   . Blindness Neg Hx   . Cataracts Neg Hx   . Glaucoma Neg Hx   . Macular degeneration Neg Hx   . Retinal detachment Neg Hx   . Strabismus Neg Hx   . Retinitis pigmentosa Neg Hx     SOCIAL HISTORY Social History   Tobacco Use  . Smoking status: Former Research scientist (life sciences)  . Smokeless tobacco: Never Used  . Tobacco comment: years ago 50 plus years  Substance Use Topics  . Alcohol use: No  . Drug use: No         OPHTHALMIC EXAM:  Base Eye Exam    Visual Acuity (Snellen - Linear)      Right Left   Dist cc 20/20 - 20/40 +   Dist ph cc  20/40 +   Correction:  Glasses       Tonometry (Tonopen, 1:00 PM)      Right Left   Pressure 15 19       Pupils      Dark Light Shape React APD   Right 1.5 1 Round Minimal None   Left 1.5 1 Round Minimal None       Visual Fields (Counting fingers)      Left Right    Full Full       Extraocular Movement      Right Left    Full, Ortho Full, Ortho       Neuro/Psych    Oriented x3:  Yes   Mood/Affect:  Normal       Dilation    Both eyes:  1.0% Mydriacyl, 2.5% Phenylephrine @ 1:00 PM        Slit Lamp and Fundus Exam    Slit Lamp Exam      Right Left   Lids/Lashes Dermatochalasis - upper lid, Meibomian gland dysfunction Dermatochalasis - upper lid, Meibomian gland dysfunction   Conjunctiva/Sclera White and quiet White and quiet   Cornea Arcus, nasal and temporal LRI, irregular epithelium inferiorly, 1+ Punctate epithelial erosions Irregular epithelium inferiorly, 1+ Punctate epithelial erosions, Arcus, nasal  and temporal LRI   Anterior Chamber Deep and quiet Deep and quiet, no cell or flare   Iris Round and moderately dilated to 5.5 mm Round and moderately dilated to 5.5 mm   Lens PC IOL in good position  with open PC PC IOL in good position, open PC   Vitreous Vitreous syneresis Vitreous syneresis, Posterior vitreous detachment       Fundus Exam      Right Left   Disc Normal vertical cupping, inferior rim thinning, vascular loops temporally   C/D Ratio 0.3 0.65   Macula Flat, Retinal pigment epithelial mottling, few MAs temporal to fovea, No edema Epiretinal membrane, Blunted foveal reflex - improved, central cystic changes -- improved, Intraretinal hemorrhages - resolved   Vessels Tortuous - inferior>superior, mild Copper wiring Tortuous (sup > inf), AV crossing changes, Copper wiring, Dilation   Periphery Attached, scattered MAs Attached, scattered MAs          IMAGING AND PROCEDURES  Imaging and Procedures for 05/25/17  OCT, Retina - OU - Both Eyes       Right Eye Quality was good. Central Foveal Thickness: 283. Progression has been stable. Findings include normal foveal contour, no IRF, no SRF, epiretinal membrane (Trace cystic change).   Left Eye Quality was poor. Central Foveal Thickness: 295. Progression has been stable. Findings include intraretinal fluid, epiretinal membrane, no SRF, normal foveal contour (Trace cystic change).   Notes *Images captured and stored on drive  Diagnosis / Impression:  OD: NFP, No IRF/SRF, trace ERM OS: central CME/IRF improved, No SRF, mild ERM  Clinical management:  See below  Abbreviations: NFP - Normal foveal profile. CME - cystoid macular edema. PED - pigment epithelial detachment. IRF - intraretinal fluid. SRF - subretinal fluid. EZ - ellipsoid zone. ERM - epiretinal membrane. ORA - outer retinal atrophy. ORT - outer retinal tubulation. SRHM - subretinal hyper-reflective material         Intravitreal Injection, Pharmacologic  Agent - OS - Left Eye       Time Out 06/30/2017. 1:35 PM. Confirmed correct patient, procedure, site, and patient consented.   Anesthesia Topical anesthesia was used. Anesthetic medications included Tetracaine 0.5%, Lidocaine 2%.   Procedure Preparation included 5% betadine to ocular surface, eyelid speculum. A supplied needle was used.   Injection: 1.25 mg Bevacizumab 1.25mg /0.8ml   NDC: 63845-364-68    Lot: [id number]     Expiration Date: 08/19/2017   Route: Intravitreal   Site: Left Eye   Waste: 0 mg  Post-op Post injection exam found visual acuity of at least counting fingers. The patient tolerated the procedure well. There were no complications. The patient received written and verbal post procedure care education.                 ASSESSMENT/PLAN:    ICD-10-CM   1. Branch retinal vein occlusion of left eye with macular edema H34.8320 Intravitreal Injection, Pharmacologic Agent - OS - Left Eye    Bevacizumab (AVASTIN) SOLN 1.25 mg  2. Retinal edema H35.81 OCT, Retina - OU - Both Eyes  3. Essential hypertension I10   4. Hypertensive retinopathy of both eyes H35.033   5. Posterior vitreous detachment of left eye H43.812   6. Pseudophakia of both eyes Z96.1   7. PCO (posterior capsular opacification), left H26.492     1,2. BRVO w/ macular edema OS - on presenting exam, superior temporal venule was dilated and tortuous but no significant intraretinal hemorrhages suggesting perhaps a remote BRVO event has occurred - FA at presentation (01.23.19) shows significant leakage from branches from the superior temporal venules corresponding to central cystic changes noted on OCT - S/P IVA OS #1 (01.23.19), #2 (02.25.19), #3 (04.01.19) -- 5 week interval - OCT today shows interval  improvement in IRF - BCVA 20/40, today -- feels like she sees better without glasses - recommend IVA OS #4 today (05.07.19) w/ extension to 6 wk interval - RBA of procedure discussed, questions  answered - informed consent obtained and signed - see procedure note - F/U 6 weeks -- DFE/OCT/possible injection  3,4. Hypertensive retinopathy OU - discussed importance of tight BP control - monitor  5. PVD OS - Discussed findings and prognosis - No RT or RD on 360 peripheral exam - Reviewed s/s of RT/RD - Strict return precautions for any such RT/RD signs/symptoms  6,7. Pseudophakia OU w/ residual PCO OS - s/p CE/IOL OU 11 yrs ago at [facility] - s/p YAG capsulotomy OU - OS re-YAG'd by Dr. Kathlen Mody -- PC opening improved - monitor   Ophthalmic Meds Ordered this visit:  Meds ordered this encounter  Medications  . Bevacizumab (AVASTIN) SOLN 1.25 mg      Return in about 6 weeks (around 08/11/2017) for F/U BRVO w/ ME OS, DFE, OCT.  There are no Patient Instructions on file for this visit.   Explained the diagnoses, plan, and follow up with the patient and they expressed understanding.  Patient expressed understanding of the importance of proper follow up care.   This document serves as a record of services personally performed by Gardiner Sleeper, MD, PhD. It was created on their behalf by Catha Brow, [place], a certified ophthalmic assistant. The creation of this record is the provider's dictation and/or activities during the visit.  Electronically signed by: Catha Brow, [place]  06/29/2017 11:49 PM    Gardiner Sleeper, M.D., Ph.D. Diseases & Surgery of the Retina and [place] 06/29/17  I have reviewed the above documentation for accuracy and completeness, and I agree with the above. Gardiner Sleeper, M.D., Ph.D. 07/01/17 11:52 PM    Abbreviations: M myopia (nearsighted); A astigmatism; H hyperopia (farsighted); P presbyopia; Mrx spectacle prescription;  CTL contact lenses; OD right eye; OS left eye; OU both eyes  XT exotropia; ET esotropia; PEK punctate epithelial keratitis; PEE punctate epithelial erosions; DES dry eye  syndrome; MGD meibomian gland dysfunction; ATs artificial tears; PFAT's preservative free artificial tears; [place] nuclear sclerotic cataract; PSC posterior subcapsular cataract; ERM epi-retinal membrane; PVD posterior vitreous detachment; RD retinal detachment; DM diabetes mellitus; DR diabetic retinopathy; NPDR non-proliferative diabetic retinopathy; PDR proliferative diabetic retinopathy; CSME clinically significant macular edema; DME diabetic macular edema; dbh dot blot hemorrhages; CWS cotton wool spot; POAG primary open angle glaucoma; C/D cup-to-disc ratio; HVF humphrey visual field; GVF goldmann visual field; OCT optical coherence tomography; IOP intraocular pressure; BRVO Branch retinal vein occlusion; CRVO central retinal vein occlusion; CRAO central retinal artery occlusion; BRAO branch retinal artery occlusion; RT retinal tear; SB scleral buckle; PPV pars plana vitrectomy; VH Vitreous hemorrhage; PRP panretinal laser photocoagulation; IVK intravitreal kenalog; VMT vitreomacular traction; MH Macular hole;  NVD neovascularization of the disc; NVE neovascularization elsewhere; AREDS age related eye disease study; ARMD age related macular degeneration; POAG primary open angle glaucoma; EBMD epithelial/anterior basement membrane dystrophy; ACIOL anterior chamber intraocular lens; IOL intraocular lens; PCIOL posterior chamber intraocular lens; Phaco/IOL phacoemulsification with intraocular lens placement; [place] photorefractive keratectomy; LASIK laser assisted in situ keratomileusis; HTN hypertension; DM diabetes mellitus; COPD chronic obstructive pulmonary disease

## 2017-06-30 ENCOUNTER — Encounter (INDEPENDENT_AMBULATORY_CARE_PROVIDER_SITE_OTHER): Payer: Self-pay | Admitting: Ophthalmology

## 2017-06-30 ENCOUNTER — Ambulatory Visit (INDEPENDENT_AMBULATORY_CARE_PROVIDER_SITE_OTHER): Payer: Medicare Other | Admitting: Ophthalmology

## 2017-06-30 DIAGNOSIS — H34832 Tributary (branch) retinal vein occlusion, left eye, with macular edema: Secondary | ICD-10-CM | POA: Diagnosis not present

## 2017-06-30 DIAGNOSIS — H3581 Retinal edema: Secondary | ICD-10-CM

## 2017-06-30 DIAGNOSIS — H43812 Vitreous degeneration, left eye: Secondary | ICD-10-CM

## 2017-06-30 DIAGNOSIS — Z961 Presence of intraocular lens: Secondary | ICD-10-CM

## 2017-06-30 DIAGNOSIS — I10 Essential (primary) hypertension: Secondary | ICD-10-CM

## 2017-06-30 DIAGNOSIS — H26492 Other secondary cataract, left eye: Secondary | ICD-10-CM

## 2017-06-30 DIAGNOSIS — H35033 Hypertensive retinopathy, bilateral: Secondary | ICD-10-CM

## 2017-06-30 MED ORDER — BEVACIZUMAB CHEMO INJECTION 1.25MG/0.05ML SYRINGE FOR KALEIDOSCOPE
1.2500 mg | INTRAVITREAL | Status: AC
  Administered 2017-06-30: 1.25 mg via INTRAVITREAL

## 2017-07-01 ENCOUNTER — Encounter (INDEPENDENT_AMBULATORY_CARE_PROVIDER_SITE_OTHER): Payer: Self-pay | Admitting: Ophthalmology

## 2017-07-01 ENCOUNTER — Other Ambulatory Visit: Payer: Self-pay

## 2017-07-01 ENCOUNTER — Other Ambulatory Visit: Payer: Self-pay | Admitting: Cardiovascular Disease

## 2017-07-01 ENCOUNTER — Ambulatory Visit (INDEPENDENT_AMBULATORY_CARE_PROVIDER_SITE_OTHER): Payer: Medicare Other

## 2017-07-01 DIAGNOSIS — I359 Nonrheumatic aortic valve disorder, unspecified: Secondary | ICD-10-CM

## 2017-08-14 NOTE — Progress Notes (Signed)
[facility] Note  08/18/2017     CHIEF COMPLAINT Patient presents for Retina Follow Up   HISTORY OF PRESENT ILLNESS: Martha Richard is a 84 y.o. female who presents to the clinic today for:   HPI    Retina Follow Up    Patient presents with  CRVO/BRVO.  In left eye.  Severity is moderate.  Duration of 6 weeks.  Since onset it is stable.  I, the attending physician,  performed the HPI with the patient and updated documentation appropriately.          Comments    Pt presents for BRVO OS F/U, pt states VA is the same as last visit, states she has to get new glasses because the bifocal in hers is up too far, pt states she still has floaters, but they are not as bad as they were, she denies pain, wavy vision or flashes, pt uses AT's PRN, pt states she is tolerating injections well,        Last edited by Bernarda Caffey, MD on 08/18/2017  1:48 PM. (History)    Pt states she is unsure if she is able to see any VA improvements from injection;   Referring physician: Hortencia Pilar, MD [place], [place] [address]  HISTORICAL INFORMATION:   Selected notes from the MEDICAL RECORD NUMBER Referred by Dr. Read Drivers for concern of cystic IRF OS;  LEE- 12.18.18 (C. Weaver) [BCVA OD: 20/30 OS: 20/50] Ocular Hx- pseudophakia OU, S/P YAG Cap OU PMH- HTN, former smoker    CURRENT MEDICATIONS: No current outpatient medications on file. (Ophthalmic Drugs)   No current facility-administered medications for this visit.  (Ophthalmic Drugs)   Current Outpatient Medications (Other)  Medication Sig  . chlorthalidone (HYGROTON) 25 MG tablet Take 6.25 mg by mouth daily as needed (elevated blood pressure). Takes 1/4 tablet only as needed for high blood pressure   . hydrochlorothiazide (HYDRODIURIL) 12.5 MG tablet Take 12.5 mg by mouth daily.  . hydrochlorothiazide (HYDRODIURIL) 12.5 MG tablet hydrochlorothiazide 12.5 mg tablet  . trolamine  salicylate (ASPERCREME) 10 % cream Apply 1 application topically daily as needed for muscle pain.   Current Facility-Administered Medications (Other)  Medication Route  . Bevacizumab (AVASTIN) SOLN 1.25 mg Intravitreal  . Bevacizumab (AVASTIN) SOLN 1.25 mg Intravitreal  . Bevacizumab (AVASTIN) SOLN 1.25 mg Intravitreal  . Bevacizumab (AVASTIN) SOLN 1.25 mg Intravitreal  . Bevacizumab (AVASTIN) SOLN 1.25 mg Intravitreal      REVIEW OF SYSTEMS: ROS    Positive for: Cardiovascular, Eyes   Negative for: Constitutional, Gastrointestinal, Neurological, Skin, Genitourinary, Musculoskeletal, HENT, Endocrine, Respiratory, Psychiatric, Allergic/Imm, Heme/Lymph   Last edited by Debbrah Alar, COT on 08/18/2017  1:33 PM. (History)       ALLERGIES Allergies  Allergen Reactions  . Ace Inhibitors Cough  . Caffeine Other (See Comments)    "shoots my BP up"  . Nsaids Other (See Comments)    GI upset    . Peanut-Containing Drug Products Other (See Comments)    headaches  . Ultram [Tramadol Hcl] Other (See Comments)    Headaches. Patient is unsure of this.  . Penicillins Itching and Rash    Has patient had a PCN reaction causing immediate rash, facial/tongue/throat swelling, SOB or lightheadedness with hypotension: No Has patient had a PCN reaction causing severe rash involving mucus membranes or skin necrosis: No Has patient had a PCN reaction that required hospitalization No Has patient had  a PCN reaction occurring within the last 10 years: No If all of the above answers are "NO", then may proceed with Cephalosporin use.     PAST MEDICAL HISTORY Past Medical History:  Diagnosis Date  . Arrhythmia    palpitations  . Arthritis   . Cancer (HCC)    squamous cell Left hand  . Complication of anesthesia    slow to wake up  . Headache    hx of migraines  . Hypertension    Past Surgical History:  Procedure Laterality Date  . APPENDECTOMY    . CATARACT EXTRACTION     OU about 10  yrs ago  . fibroadenoma left breast removal    . OTHER SURGICAL HISTORY     appendectomy  . TOTAL HIP ARTHROPLASTY Left 06/18/2016   Procedure: LEFT TOTAL HIP ARTHROPLASTY ANTERIOR APPROACH;  Surgeon: Gaynelle Arabian, MD;  Location: [facility];  Service: Orthopedics;  Laterality: Left;  . UTERINE FIBROID SURGERY      FAMILY HISTORY Family History  Problem Relation Age of Onset  . Heart disease Mother   . Heart attack Mother   . Heart failure Mother   . Stroke Father   . Heart attack Maternal Aunt   . Heart disease Maternal Aunt   . Heart failure Maternal Aunt   . Amblyopia Neg Hx   . Blindness Neg Hx   . Cataracts Neg Hx   . Glaucoma Neg Hx   . Macular degeneration Neg Hx   . Retinal detachment Neg Hx   . Strabismus Neg Hx   . Retinitis pigmentosa Neg Hx     SOCIAL HISTORY Social History   Tobacco Use  . Smoking status: Former Research scientist (life sciences)  . Smokeless tobacco: Never Used  . Tobacco comment: years ago 50 plus years  Substance Use Topics  . Alcohol use: No  . Drug use: No         OPHTHALMIC EXAM:  Base Eye Exam    Visual Acuity (Snellen - Linear)      Right Left   Dist cc 20/25 +1 20/40 -2   Dist ph cc 20/20 -1 20/30 -1   Correction:  Glasses       Tonometry (Tonopen, 1:41 PM)      Right Left   Pressure 14 20       Tonometry #2      Right Left   Pressure  20       Pupils      Dark Light Shape React APD   Right 2 1.5 Round Slow None   Left 2 1.5 Round Slow None       Visual Fields (Counting fingers)      Left Right    Full Full       Extraocular Movement      Right Left    Full, Ortho Full, Ortho       Neuro/Psych    Oriented x3:  Yes   Mood/Affect:  Normal       Dilation    Both eyes:  1.0% Mydriacyl, 2.5% Phenylephrine @ 1:40 PM        Slit Lamp and Fundus Exam    Slit Lamp Exam      Right Left   Lids/Lashes Dermatochalasis - upper lid, Meibomian gland dysfunction Dermatochalasis - upper lid, Meibomian gland dysfunction    Conjunctiva/Sclera White and quiet White and quiet   Cornea Arcus, nasal and temporal LRI, irregular epithelium inferiorly, 1+ Punctate epithelial erosions Irregular epithelium  inferiorly, 2+ Punctate epithelial erosions, Arcus, nasal and temporal LRI   Anterior Chamber Deep and quiet Deep and quiet, no cell or flare   Iris Round and moderately dilated to 5.5 mm Round and moderately dilated to 5.5 mm   Lens PC IOL in good position with open PC PC IOL in good position, open PC   Vitreous Vitreous syneresis Vitreous syneresis, Posterior vitreous detachment       Fundus Exam      Right Left   Disc Normal vertical cupping, inferior rim thinning, vascular loops temporally   C/D Ratio 0.3 0.65   Macula Flat, Retinal pigment epithelial mottling, few MAs temporal to fovea, mild focal disc edema at 0500 Epiretinal membrane, Blunted foveal reflex - improved, central cystic changes -- improved, Intraretinal hemorrhages - resolved   Vessels Tortuous - inferior>superior, mild Copper wiring Tortuous (sup > inf), AV crossing changes, Copper wiring, Dilation   Periphery Attached, scattered MAs Attached, scattered MAs          IMAGING AND PROCEDURES  Imaging and Procedures for 05/25/17  OCT, Retina - OU - Both Eyes       Right Eye Quality was borderline. Central Foveal Thickness: 279. Progression has been stable. Findings include normal foveal contour, no IRF, no SRF, epiretinal membrane (Trace cystic change).   Left Eye Quality was poor. Central Foveal Thickness: 261. Progression has been stable. Findings include epiretinal membrane, no SRF, normal foveal contour, no IRF (Trace cystic changes temporally, focal area of ORA).   Notes *Images captured and stored on drive  Diagnosis / Impression:  OD: NFP, No IRF/SRF, trace ERM OS: central CME/IRF stably improved, No SRF, mild ERM  Clinical management:  See below  Abbreviations: NFP - Normal foveal profile. CME - cystoid macular edema. PED -  pigment epithelial detachment. IRF - intraretinal fluid. SRF - subretinal fluid. EZ - ellipsoid zone. ERM - epiretinal membrane. ORA - outer retinal atrophy. ORT - outer retinal tubulation. SRHM - subretinal hyper-reflective material         Intravitreal Injection, Pharmacologic Agent - OS - Left Eye       Time Out 08/18/2017. 2:23 PM. Confirmed correct patient, procedure, site, and patient consented.   Anesthesia Topical anesthesia was used. Anesthetic medications included Tetracaine 0.5%, Lidocaine 2%.   Procedure Preparation included 5% betadine to ocular surface, eyelid speculum. A supplied needle was used.   Injection: 1.25 mg Bevacizumab 1.25mg /0.64ml   NDC: 02409-735-32    Lot: [id number]     Expiration Date: 10/08/2017   Route: Intravitreal   Site: Left Eye   Waste: 0 mg  Post-op Post injection exam found visual acuity of at least counting fingers. The patient tolerated the procedure well. There were no complications. The patient received written and verbal post procedure care education.                 ASSESSMENT/PLAN:    ICD-10-CM   1. Branch retinal vein occlusion of left eye with macular edema H34.8320 OCT, Retina - OU - Both Eyes    Intravitreal Injection, Pharmacologic Agent - OS - Left Eye    Bevacizumab (AVASTIN) SOLN 1.25 mg  2. Retinal edema H35.81 OCT, Retina - OU - Both Eyes  3. Essential hypertension I10   4. Hypertensive retinopathy of both eyes H35.033   5. Posterior vitreous detachment of left eye H43.812   6. Glaucoma suspect of left eye H40.002   7. Pseudophakia of both eyes Z96.1     1,2. BRVO w/  macular edema OS - on presenting exam, superior temporal venule was dilated and tortuous but no significant intraretinal hemorrhages suggesting perhaps a remote BRVO event has occurred - FA at presentation (01.23.19) shows significant leakage from branches from the superior temporal venules corresponding to central cystic changes noted on OCT -  S/P IVA OS #1 (01.23.19), #2 (02.25.19), #3 (04.01.19), #4 (05.07.19) -- 7 wk interval - OCT today shows stable improvement in IRF - BCVA 20/30, today -- feels like she sees better without glasses - recommend IVA OS #5 today (06.25.19) with extension to 8 wks - RBA of procedure discussed, questions answered - informed consent obtained and signed - see procedure note - F/U 8 weeks -- DFE/OCT/possible injection  3,4. Hypertensive retinopathy OU - discussed importance of tight BP control - monitor  5. PVD OS - Discussed findings and prognosis - No RT or RD on 360 peripheral exam - Reviewed s/s of RT/RD - Strict return precautions for any such RT/RD signs/symptoms  6. Glaucoma Suspect OS - appointment with Dr. Kathlen Mody scheduled for July 25th  7, 8. Pseudophakia OU - s/p CE/IOL OU 11 yrs ago at [facility] - s/p YAG capsulotomy OU - OS re-YAG'd by Dr. Kathlen Mody -- PC opening improved - monitor   Ophthalmic Meds Ordered this visit:  Meds ordered this encounter  Medications  . Bevacizumab (AVASTIN) SOLN 1.25 mg      Return in about 8 weeks (around 10/13/2017) for F/U BRVO OS, DFE, OCT.  There are no Patient Instructions on file for this visit.   Explained the diagnoses, plan, and follow up with the patient and they expressed understanding.  Patient expressed understanding of the importance of proper follow up care.   This document serves as a record of services personally performed by Gardiner Sleeper, MD, PhD. It was created on their behalf by Catha Brow, [place], a certified ophthalmic assistant. The creation of this record is the provider's dictation and/or activities during the visit.  Electronically signed by: Catha Brow, [place]  06.25.19 12:50 AM    Gardiner Sleeper, M.D., Ph.D. Diseases & Surgery of the Retina and Vitreous [facility]  I have reviewed the above documentation for accuracy and completeness, and I agree with the above. Gardiner Sleeper, M.D., Ph.D. 08/23/17 12:51 AM   Abbreviations: M myopia (nearsighted); A astigmatism; H hyperopia (farsighted); P presbyopia; Mrx spectacle prescription;  CTL contact lenses; OD right eye; OS left eye; OU both eyes  XT exotropia; ET esotropia; PEK punctate epithelial keratitis; PEE punctate epithelial erosions; DES dry eye syndrome; MGD meibomian gland dysfunction; ATs artificial tears; PFAT's preservative free artificial tears; [place] nuclear sclerotic cataract; PSC posterior subcapsular cataract; ERM epi-retinal membrane; PVD posterior vitreous detachment; RD retinal detachment; DM diabetes mellitus; DR diabetic retinopathy; NPDR non-proliferative diabetic retinopathy; PDR proliferative diabetic retinopathy; CSME clinically significant macular edema; DME diabetic macular edema; dbh dot blot hemorrhages; CWS cotton wool spot; POAG primary open angle glaucoma; C/D cup-to-disc ratio; HVF humphrey visual field; GVF goldmann visual field; OCT optical coherence tomography; IOP intraocular pressure; BRVO Branch retinal vein occlusion; CRVO central retinal vein occlusion; CRAO central retinal artery occlusion; BRAO branch retinal artery occlusion; RT retinal tear; SB scleral buckle; PPV pars plana vitrectomy; VH Vitreous hemorrhage; PRP panretinal laser photocoagulation; IVK intravitreal kenalog; VMT vitreomacular traction; MH Macular hole;  NVD neovascularization of the disc; NVE neovascularization elsewhere; AREDS age related eye disease study; ARMD age related macular degeneration; POAG primary open angle glaucoma; EBMD  epithelial/anterior basement membrane dystrophy; ACIOL anterior chamber intraocular lens; IOL intraocular lens; PCIOL posterior chamber intraocular lens; Phaco/IOL phacoemulsification with intraocular lens placement; [place] photorefractive keratectomy; LASIK laser assisted in situ keratomileusis; HTN hypertension; DM diabetes mellitus; COPD chronic obstructive pulmonary disease

## 2017-08-18 ENCOUNTER — Ambulatory Visit (INDEPENDENT_AMBULATORY_CARE_PROVIDER_SITE_OTHER): Payer: Medicare Other | Admitting: Ophthalmology

## 2017-08-18 ENCOUNTER — Encounter (INDEPENDENT_AMBULATORY_CARE_PROVIDER_SITE_OTHER): Payer: Self-pay | Admitting: Ophthalmology

## 2017-08-18 DIAGNOSIS — H40002 Preglaucoma, unspecified, left eye: Secondary | ICD-10-CM | POA: Diagnosis not present

## 2017-08-18 DIAGNOSIS — H3581 Retinal edema: Secondary | ICD-10-CM

## 2017-08-18 DIAGNOSIS — Z961 Presence of intraocular lens: Secondary | ICD-10-CM | POA: Diagnosis not present

## 2017-08-18 DIAGNOSIS — H35033 Hypertensive retinopathy, bilateral: Secondary | ICD-10-CM | POA: Diagnosis not present

## 2017-08-18 DIAGNOSIS — I10 Essential (primary) hypertension: Secondary | ICD-10-CM | POA: Diagnosis not present

## 2017-08-18 DIAGNOSIS — H34832 Tributary (branch) retinal vein occlusion, left eye, with macular edema: Secondary | ICD-10-CM | POA: Diagnosis not present

## 2017-08-18 DIAGNOSIS — H43812 Vitreous degeneration, left eye: Secondary | ICD-10-CM

## 2017-08-23 ENCOUNTER — Encounter (INDEPENDENT_AMBULATORY_CARE_PROVIDER_SITE_OTHER): Payer: Self-pay | Admitting: Ophthalmology

## 2017-08-23 DIAGNOSIS — H43812 Vitreous degeneration, left eye: Secondary | ICD-10-CM | POA: Diagnosis not present

## 2017-08-23 DIAGNOSIS — H34832 Tributary (branch) retinal vein occlusion, left eye, with macular edema: Secondary | ICD-10-CM | POA: Diagnosis not present

## 2017-08-23 MED ORDER — BEVACIZUMAB CHEMO INJECTION 1.25MG/0.05ML SYRINGE FOR KALEIDOSCOPE
1.2500 mg | INTRAVITREAL | Status: AC
  Administered 2017-08-23: 1.25 mg via INTRAVITREAL

## 2017-09-15 DIAGNOSIS — H401121 Primary open-angle glaucoma, left eye, mild stage: Secondary | ICD-10-CM | POA: Diagnosis not present

## 2017-09-15 DIAGNOSIS — H34832 Tributary (branch) retinal vein occlusion, left eye, with macular edema: Secondary | ICD-10-CM | POA: Diagnosis not present

## 2017-09-15 DIAGNOSIS — H40021 Open angle with borderline findings, high risk, right eye: Secondary | ICD-10-CM | POA: Diagnosis not present

## 2017-09-15 DIAGNOSIS — H3563 Retinal hemorrhage, bilateral: Secondary | ICD-10-CM | POA: Diagnosis not present

## 2017-09-28 DIAGNOSIS — H401121 Primary open-angle glaucoma, left eye, mild stage: Secondary | ICD-10-CM | POA: Diagnosis not present

## 2017-10-13 ENCOUNTER — Encounter (INDEPENDENT_AMBULATORY_CARE_PROVIDER_SITE_OTHER): Payer: Medicare Other | Admitting: Ophthalmology

## 2017-11-11 DIAGNOSIS — H401121 Primary open-angle glaucoma, left eye, mild stage: Secondary | ICD-10-CM | POA: Diagnosis not present

## 2017-11-11 DIAGNOSIS — H40021 Open angle with borderline findings, high risk, right eye: Secondary | ICD-10-CM | POA: Diagnosis not present

## 2017-12-25 DIAGNOSIS — R7301 Impaired fasting glucose: Secondary | ICD-10-CM | POA: Diagnosis not present

## 2017-12-25 DIAGNOSIS — K210 Gastro-esophageal reflux disease with esophagitis: Secondary | ICD-10-CM | POA: Diagnosis not present

## 2017-12-25 DIAGNOSIS — I10 Essential (primary) hypertension: Secondary | ICD-10-CM | POA: Diagnosis not present

## 2018-01-01 DIAGNOSIS — Z23 Encounter for immunization: Secondary | ICD-10-CM | POA: Diagnosis not present

## 2018-01-01 DIAGNOSIS — Z0001 Encounter for general adult medical examination with abnormal findings: Secondary | ICD-10-CM | POA: Diagnosis not present

## 2018-01-01 DIAGNOSIS — I10 Essential (primary) hypertension: Secondary | ICD-10-CM | POA: Diagnosis not present

## 2018-02-25 DIAGNOSIS — M25551 Pain in right hip: Secondary | ICD-10-CM | POA: Diagnosis not present

## 2018-02-25 DIAGNOSIS — M1611 Unilateral primary osteoarthritis, right hip: Secondary | ICD-10-CM | POA: Diagnosis not present

## 2018-03-02 DIAGNOSIS — H401121 Primary open-angle glaucoma, left eye, mild stage: Secondary | ICD-10-CM | POA: Diagnosis not present

## 2018-03-02 DIAGNOSIS — H40021 Open angle with borderline findings, high risk, right eye: Secondary | ICD-10-CM | POA: Diagnosis not present

## 2018-03-16 DIAGNOSIS — I10 Essential (primary) hypertension: Secondary | ICD-10-CM | POA: Diagnosis not present

## 2018-03-16 DIAGNOSIS — R011 Cardiac murmur, unspecified: Secondary | ICD-10-CM | POA: Diagnosis not present

## 2018-03-16 DIAGNOSIS — R7301 Impaired fasting glucose: Secondary | ICD-10-CM | POA: Diagnosis not present

## 2018-03-16 DIAGNOSIS — K210 Gastro-esophageal reflux disease with esophagitis: Secondary | ICD-10-CM | POA: Diagnosis not present

## 2018-03-16 DIAGNOSIS — M1611 Unilateral primary osteoarthritis, right hip: Secondary | ICD-10-CM | POA: Diagnosis not present

## 2018-04-05 ENCOUNTER — Encounter: Payer: Self-pay | Admitting: Cardiovascular Disease

## 2018-04-05 ENCOUNTER — Ambulatory Visit: Payer: Medicare Other | Admitting: Cardiovascular Disease

## 2018-04-05 ENCOUNTER — Telehealth: Payer: Self-pay | Admitting: Cardiovascular Disease

## 2018-04-05 VITALS — BP 163/78 | HR 70 | Ht 63.0 in | Wt 156.4 lb

## 2018-04-05 DIAGNOSIS — I447 Left bundle-branch block, unspecified: Secondary | ICD-10-CM

## 2018-04-05 DIAGNOSIS — I351 Nonrheumatic aortic (valve) insufficiency: Secondary | ICD-10-CM | POA: Diagnosis not present

## 2018-04-05 DIAGNOSIS — I10 Essential (primary) hypertension: Secondary | ICD-10-CM | POA: Diagnosis not present

## 2018-04-05 DIAGNOSIS — Z01818 Encounter for other preprocedural examination: Secondary | ICD-10-CM

## 2018-04-05 DIAGNOSIS — I350 Nonrheumatic aortic (valve) stenosis: Secondary | ICD-10-CM | POA: Diagnosis not present

## 2018-04-05 NOTE — Telephone Encounter (Signed)
°  Precert needed for: Echo - aortic stenosis    Location: [facility]   Date: Feb 26,2020

## 2018-04-05 NOTE — Addendum Note (Signed)
Addended by: Laurine Blazer on: 04/05/2018 02:06 PM   Modules accepted: Orders

## 2018-04-05 NOTE — Progress Notes (Signed)
SUBJECTIVE: The patient presents for routine follow-up. She has a chronic left bundle branch block.   Echocardiogram on 07/01/2017 demonstrated normal left ventricular systolic function, LVEF 60 to 47%, grade 1 diastolic dysfunction, moderate aortic stenosis and mild aortic regurgitation, and mild mitral and tricuspid regurgitation.  A small circumferential pericardial effusion was seen.  She underwent a normal nuclear stress test on 05/23/16.  She denies chest pain, palpitations, lightheadedness, dizziness, shortness of breath, and syncope.  I personally reviewed the ECG performed on 03/16/2018 which showed sinus rhythm with a left bundle branch block which is chronic.  Her primary complaint today relates to right hip pain.  She underwent left hip replacement surgery on 06/18/2016.  She plans to undergo right hip replacement surgery on 05/05/2018.    Review of Systems: As per "subjective", otherwise negative.  Allergies  Allergen Reactions  . Ace Inhibitors Cough  . Caffeine Other (See Comments)    "shoots my BP up"  . Nsaids Other (See Comments)    GI upset    . Peanut-Containing Drug Products Other (See Comments)    headaches  . Ultram [Tramadol Hcl] Other (See Comments)    Headaches. Patient is unsure of this.  . Penicillins Itching and Rash    Has patient had a PCN reaction causing immediate rash, facial/tongue/throat swelling, SOB or lightheadedness with hypotension: No Has patient had a PCN reaction causing severe rash involving mucus membranes or skin necrosis: No Has patient had a PCN reaction that required hospitalization No Has patient had a PCN reaction occurring within the last 10 years: No If all of the above answers are "NO", then may proceed with Cephalosporin use.     Current Outpatient Medications  Medication Sig Dispense Refill  . chlorthalidone (HYGROTON) 25 MG tablet Take 6.25 mg by mouth daily as needed (elevated blood pressure). Takes 1/4 tablet  only as needed for high blood pressure     . hydrochlorothiazide (HYDRODIURIL) 12.5 MG tablet hydrochlorothiazide 12.5 mg tablet    . trolamine salicylate (ASPERCREME) 10 % cream Apply 1 application topically daily as needed for muscle pain.     Current Facility-Administered Medications  Medication Dose Route Frequency Provider Last Rate Last Dose  . Bevacizumab (AVASTIN) SOLN 1.25 mg  1.25 mg Intravitreal  Bernarda Caffey, MD   1.25 mg at 04/20/17 1138  . Bevacizumab (AVASTIN) SOLN 1.25 mg  1.25 mg Intravitreal  Bernarda Caffey, MD   1.25 mg at 04/20/17 1455  . Bevacizumab (AVASTIN) SOLN 1.25 mg  1.25 mg Intravitreal  Bernarda Caffey, MD   1.25 mg at 05/25/17 1345  . Bevacizumab (AVASTIN) SOLN 1.25 mg  1.25 mg Intravitreal  Bernarda Caffey, MD   1.25 mg at 06/30/17 1351  . Bevacizumab (AVASTIN) SOLN 1.25 mg  1.25 mg Intravitreal  Bernarda Caffey, MD   1.25 mg at 08/23/17 0034    Past Medical History:  Diagnosis Date  . Arrhythmia    palpitations  . Arthritis   . Cancer (HCC)    squamous cell Left hand  . Complication of anesthesia    slow to wake up  . Headache    hx of migraines  . Hypertension     Past Surgical History:  Procedure Laterality Date  . APPENDECTOMY    . CATARACT EXTRACTION     OU about 10 yrs ago  . fibroadenoma left breast removal    . OTHER SURGICAL HISTORY     appendectomy  . TOTAL HIP ARTHROPLASTY Left 06/18/2016  Procedure: LEFT TOTAL HIP ARTHROPLASTY ANTERIOR APPROACH;  Surgeon: Gaynelle Arabian, MD;  Location: [facility];  Service: Orthopedics;  Laterality: Left;  . UTERINE FIBROID SURGERY      Social History   Socioeconomic History  . Marital status: Married    Spouse name: Not on file  . Number of children: Not on file  . Years of education: Not on file  . Highest education level: Not on file  Occupational History  . Not on file  Social Needs  . Financial resource strain: Not on file  . Food insecurity:    Worry: Not on file    Inability: Not on file  .  Transportation needs:    Medical: Not on file    Non-medical: Not on file  Tobacco Use  . Smoking status: Former Research scientist (life sciences)  . Smokeless tobacco: Never Used  . Tobacco comment: years ago 50 plus years  Substance and Sexual Activity  . Alcohol use: No  . Drug use: No  . Sexual activity: Not Currently  Lifestyle  . Physical activity:    Days per week: Not on file    Minutes per session: Not on file  . Stress: Not on file  Relationships  . Social connections:    Talks on phone: Not on file    Gets together: Not on file    Attends religious service: Not on file    Active member of club or organization: Not on file    Attends meetings of clubs or organizations: Not on file    Relationship status: Not on file  . Intimate partner violence:    Fear of current or ex partner: Not on file    Emotionally abused: Not on file    Physically abused: Not on file    Forced sexual activity: Not on file  Other Topics Concern  . Not on file  Social History Narrative  . Not on file     Vitals:   04/05/18 1339  BP: (!) 163/78  Pulse: 70  SpO2: 100%  Weight: 156 lb 6.4 oz (70.9 kg)  Height: 5\' 3"  (1.6 m)    Wt Readings from Last 3 Encounters:  04/05/18 156 lb 6.4 oz (70.9 kg)  12/05/16 158 lb 12.8 oz (72 kg)  06/18/16 156 lb (70.8 kg)     PHYSICAL EXAM General: NAD HEENT: Normal. Neck: No JVD, no thyromegaly. Lungs: Clear to auscultation bilaterally with normal respiratory effort. CV: Regular rate and rhythm, normal S1/S2, no S3/S4,  2/6 ejection systolicmurmur loudest over RUSB. No pretibial or periankle edema.  No carotid bruit.   Abdomen: Soft, nontender, no distention.  Neurologic: Alert and oriented.  Psych: Normal affect. Skin: Normal. Musculoskeletal: No gross deformities.    ECG: Reviewed above under Subjective   Labs: Lab Results  Component Value Date/Time   K 3.8 06/20/2016 05:23 AM   BUN 19 06/20/2016 05:23 AM   CREATININE 0.71 06/20/2016 05:23 AM   ALT 12  (L) 06/17/2016 09:07 AM   HGB 10.4 (L) 06/20/2016 05:23 AM     Lipids: No results found for: LDLCALC, LDLDIRECT, CHOL, TRIG, HDL     ASSESSMENT AND PLAN: 1. Aortic valvular disease with moderate aortic stenosis and mild regurgitation: Symptomatically stable. I will repeat an echocardiogram to assess for interval changes.  2. Left bundle-branch block: Chronic. Stable.  She denies chest pain and shortness of breath.  3. Hypertension: BP is elevated.  It is managed by PCP.  4.  Preoperatively stratification: She should be  able to proceed with right hip replacement surgery with an acceptable level of risk.    Disposition: Follow up 1 year   Kate Sable, M.D., F.A.C.C.

## 2018-04-05 NOTE — Patient Instructions (Signed)
Medication Instructions:  Continue all current medications.  Labwork:   Testing/Procedures:  Your physician has requested that you have an echocardiogram. Echocardiography is a painless test that uses sound waves to create images of your heart. It provides your doctor with information about the size and shape of your heart and how well your heart's chambers and valves are working. This procedure takes approximately one hour. There are no restrictions for this procedure.  Office will contact with results via phone or letter.    Follow-Up: Your physician wants you to follow up in:  1 year.  You will receive a reminder letter in the mail one-two months in advance.  If you don't receive a letter, please call our office to schedule the follow up appointment   Any Other Special Instructions Will Be Listed Below (If Applicable).  If you need a refill on your cardiac medications before your next appointment, please call your pharmacy.

## 2018-04-14 NOTE — H&P (Signed)
TOTAL HIP ADMISSION H&P  Patient is admitted for right total hip arthroplasty.  Subjective:  Chief Complaint: right hip pain  HPI: Martha Richard, 85 y.o. female, has a history of pain and functional disability in the right hip(s) due to arthritis and patient has failed non-surgical conservative treatments for greater than 12 weeks to include use of assistive devices and activity modification.  Onset of symptoms was gradual starting several years ago with gradually worsening course since that time.The patient noted no past surgery on the right hip(s).  Patient currently rates pain in the right hip at 7 out of 10 with activity. Patient has worsening of pain with activity and weight bearing and instability. Patient has evidence of bone-on-bone arthritis with subchondral cystic formation by imaging studies. This condition presents safety issues increasing the risk of falls. There is no current active infection.  Patient Active Problem List   Diagnosis Date Noted  . OA (osteoarthritis) of hip 06/18/2016  . Arrhythmia   . UNSPECIFIED ESSENTIAL HYPERTENSION 09/27/2008  . PALPITATIONS 09/27/2008   Past Medical History:  Diagnosis Date  . Arrhythmia    palpitations  . Arthritis   . Cancer (HCC)    squamous cell Left hand  . Complication of anesthesia    slow to wake up  . Headache    hx of migraines  . Hypertension     Past Surgical History:  Procedure Laterality Date  . APPENDECTOMY    . CATARACT EXTRACTION     OU about 10 yrs ago  . fibroadenoma left breast removal    . OTHER SURGICAL HISTORY     appendectomy  . TOTAL HIP ARTHROPLASTY Left 06/18/2016   Procedure: LEFT TOTAL HIP ARTHROPLASTY ANTERIOR APPROACH;  Surgeon: Gaynelle Arabian, MD;  Location: [facility];  Service: Orthopedics;  Laterality: Left;  . UTERINE FIBROID SURGERY      Current Facility-Administered Medications  Medication Dose Route Frequency Provider Last Rate Last Dose  . Bevacizumab (AVASTIN) SOLN 1.25 mg  1.25 mg  Intravitreal  Bernarda Caffey, MD   1.25 mg at 04/20/17 1138  . Bevacizumab (AVASTIN) SOLN 1.25 mg  1.25 mg Intravitreal  Bernarda Caffey, MD   1.25 mg at 04/20/17 1455  . Bevacizumab (AVASTIN) SOLN 1.25 mg  1.25 mg Intravitreal  Bernarda Caffey, MD   1.25 mg at 05/25/17 1345  . Bevacizumab (AVASTIN) SOLN 1.25 mg  1.25 mg Intravitreal  Bernarda Caffey, MD   1.25 mg at 06/30/17 1351  . Bevacizumab (AVASTIN) SOLN 1.25 mg  1.25 mg Intravitreal  Bernarda Caffey, MD   1.25 mg at 08/23/17 0034   Current Outpatient Medications  Medication Sig Dispense Refill Last Dose  . chlorthalidone (HYGROTON) 25 MG tablet Take 6.25 mg by mouth daily as needed (elevated blood pressure). Takes 1/4 tablet only as needed for high blood pressure    Taking  . hydrochlorothiazide (HYDRODIURIL) 12.5 MG tablet hydrochlorothiazide 12.5 mg tablet   Taking  . trolamine salicylate (ASPERCREME) 10 % cream Apply 1 application topically daily as needed for muscle pain.   Taking   Allergies  Allergen Reactions  . Ace Inhibitors Cough  . Caffeine Other (See Comments)    "shoots my BP up"  . Nsaids Other (See Comments)    GI upset    . Peanut-Containing Drug Products Other (See Comments)    headaches  . Ultram [Tramadol Hcl] Other (See Comments)    Headaches. Patient is unsure of this.  . Penicillins Itching and Rash    Has  patient had a PCN reaction causing immediate rash, facial/tongue/throat swelling, SOB or lightheadedness with hypotension: No Has patient had a PCN reaction causing severe rash involving mucus membranes or skin necrosis: No Has patient had a PCN reaction that required hospitalization No Has patient had a PCN reaction occurring within the last 10 years: No If all of the above answers are "NO", then may proceed with Cephalosporin use.     Social History   Tobacco Use  . Smoking status: Former Research scientist (life sciences)  . Smokeless tobacco: Never Used  . Tobacco comment: years ago 50 plus years  Substance Use Topics  . Alcohol  use: No    Family History  Problem Relation Age of Onset  . Heart disease Mother   . Heart attack Mother   . Heart failure Mother   . Stroke Father   . Heart attack Maternal Aunt   . Heart disease Maternal Aunt   . Heart failure Maternal Aunt   . Amblyopia Neg Hx   . Blindness Neg Hx   . Cataracts Neg Hx   . Glaucoma Neg Hx   . Macular degeneration Neg Hx   . Retinal detachment Neg Hx   . Strabismus Neg Hx   . Retinitis pigmentosa Neg Hx      Review of Systems  Constitutional: Negative for chills and fever.  HENT: Negative for congestion, sore throat and tinnitus.   Eyes: Negative for double vision, photophobia and pain.  Respiratory: Negative for cough, shortness of breath and wheezing.   Cardiovascular: Negative for chest pain, palpitations and orthopnea.  Gastrointestinal: Negative for heartburn, nausea and vomiting.  Genitourinary: Negative for dysuria, frequency and urgency.  Musculoskeletal: Positive for joint pain.  Neurological: Negative for dizziness, weakness and headaches.    Objective:  Physical Exam  Well nourished and well developed.  General: Alert and oriented x3, cooperative and pleasant, no acute distress.  Head: normocephalic, atraumatic, neck supple.  Eyes: EOMI.  Respiratory: breath sounds clear in all fields, no wheezing, rales, or rhonchi. Cardiovascular: Regular rate and rhythm, no murmurs, gallops or rubs.  Abdomen: non-tender to palpation and soft, normoactive bowel sounds. Musculoskeletal:  Right Hip Exam: ROM: Flexion to 100, Internal Rotation is minimal, External Rotation 20, and abduction 20 with pain. There is no tenderness over the greater trochanter bursa.   Calves soft and nontender. Motor function intact in LE. Strength 5/5 LE bilaterally. Neuro: Distal pulses 2+. Sensation to light touch intact in LE.  Vital signs in last 24 hours: Blood pressure: 136/88 mmHg Pulse: 60 bpm  Labs:   Estimated body mass index is 27.71 kg/m as  calculated from the following:   Height as of 04/05/18: 5\' 3"  (1.6 m).   Weight as of 04/05/18: 70.9 kg.   Imaging Review Plain radiographs demonstrate severe degenerative joint disease of the right hip(s). The bone quality appears to be adequate for age and reported activity level.      Assessment/Plan:  End stage arthritis, right hip(s)  The patient history, physical examination, clinical judgement of the provider and imaging studies are consistent with end stage degenerative joint disease of the right hip(s) and total hip arthroplasty is deemed medically necessary. The treatment options including medical management, injection therapy, arthroscopy and arthroplasty were discussed at length. The risks and benefits of total hip arthroplasty were presented and reviewed. The risks due to aseptic loosening, infection, stiffness, dislocation/subluxation,  thromboembolic complications and other imponderables were discussed.  The patient acknowledged the explanation, agreed to proceed with the  plan and consent was signed. Patient is being admitted for inpatient treatment for surgery, pain control, PT, OT, prophylactic antibiotics, VTE prophylaxis, progressive ambulation and ADL's and discharge planning.The patient is planning to be discharged home.  Therapy Plans: HHPT versus HEP Disposition: Home with husband Planned DVT Prophylaxis: Xarelto 10 mg daily (allergy to ASA) DME needed: Gilford Rile PCP: Consuello Masse, MD Cardiologist: Kate Sable, MD TXA: IV Allergies: Aspirin (gastric irritation), PCN (rash) Anesthesia Concerns: Difficulty awakening BMI: 26.6  - Patient was instructed on what medications to stop prior to surgery. - Follow-up visit in 2 weeks with Dr. Wynelle Link - Begin physical therapy following surgery - Pre-operative lab work as pre-surgical testing - Prescriptions will be provided in hospital at time of discharge  Theresa Duty, PA-C Orthopedic Surgery [facility]

## 2018-04-16 ENCOUNTER — Encounter (INDEPENDENT_AMBULATORY_CARE_PROVIDER_SITE_OTHER): Payer: Self-pay | Admitting: Ophthalmology

## 2018-04-16 ENCOUNTER — Ambulatory Visit (INDEPENDENT_AMBULATORY_CARE_PROVIDER_SITE_OTHER): Payer: Medicare Other | Admitting: Ophthalmology

## 2018-04-16 DIAGNOSIS — H43812 Vitreous degeneration, left eye: Secondary | ICD-10-CM

## 2018-04-16 DIAGNOSIS — H34832 Tributary (branch) retinal vein occlusion, left eye, with macular edema: Secondary | ICD-10-CM

## 2018-04-16 DIAGNOSIS — H3581 Retinal edema: Secondary | ICD-10-CM | POA: Diagnosis not present

## 2018-04-16 DIAGNOSIS — Z961 Presence of intraocular lens: Secondary | ICD-10-CM

## 2018-04-16 DIAGNOSIS — I10 Essential (primary) hypertension: Secondary | ICD-10-CM

## 2018-04-16 DIAGNOSIS — H26492 Other secondary cataract, left eye: Secondary | ICD-10-CM

## 2018-04-16 DIAGNOSIS — H40002 Preglaucoma, unspecified, left eye: Secondary | ICD-10-CM

## 2018-04-16 DIAGNOSIS — H35033 Hypertensive retinopathy, bilateral: Secondary | ICD-10-CM | POA: Diagnosis not present

## 2018-04-16 MED ORDER — BEVACIZUMAB CHEMO INJECTION 1.25MG/0.05ML SYRINGE FOR KALEIDOSCOPE
1.2500 mg | INTRAVITREAL | Status: AC
  Administered 2018-04-16: 1.25 mg via INTRAVITREAL

## 2018-04-16 NOTE — Progress Notes (Signed)
[facility] Note  04/16/2018     CHIEF COMPLAINT Patient presents for Retina Follow Up   HISTORY OF PRESENT ILLNESS: Martha Richard is a 85 y.o. female who presents to the clinic today for:   HPI    Retina Follow Up    Patient presents with  CRVO/BRVO.  In left eye.  This started 10 months ago.  Severity is moderate.  Duration of 8 months.  Since onset it is rapidly improving.  I, the attending physician,  performed the HPI with the patient and updated documentation appropriately.          Comments    85 y/o female pt here for 8 mo f/u for BRVO OS.  Had an earlier appt, but deferred due to other medical issues, and death of son.  Feels VA OS is blurrier.  Unsure if VA OD has changed or not.  Denies pain, flashes, floaters.  AT prn OU.       Last edited by Bernarda Caffey, MD on 04/16/2018  1:49 PM. (History)    pt states she did not come back for her follow up appt bc of her sons death several months ago, pt states she thought her left eye was getting worse, but when it was checked today she did better on the eye chart, she states she used to be able to watch TV without her glasses, but she cannot now. Pt is scheduled for R hip replacement surgery in early March, which may interfere with follow up.   Referring physician: Manon Hilding, MD [place], [place] [address]  HISTORICAL INFORMATION:   Selected notes from the MEDICAL RECORD NUMBER Referred by Dr. Read Drivers for concern of cystic IRF OS;  LEE- 12.18.18 (C. Weaver) [BCVA OD: 20/30 OS: 20/50] Ocular Hx- pseudophakia OU, S/P YAG Cap OU PMH- HTN, former smoker    CURRENT MEDICATIONS: No current outpatient medications on file. (Ophthalmic Drugs)   No current facility-administered medications for this visit.  (Ophthalmic Drugs)   Current Outpatient Medications (Other)  Medication Sig  . chlorthalidone (HYGROTON) 25 MG tablet Take 6.25 mg by mouth daily as needed (elevated blood pressure).  Takes 1/4 tablet only as needed for high blood pressure   . hydrochlorothiazide (HYDRODIURIL) 12.5 MG tablet hydrochlorothiazide 12.5 mg tablet  . trolamine salicylate (ASPERCREME) 10 % cream Apply 1 application topically daily as needed for muscle pain.   Current Facility-Administered Medications (Other)  Medication Route  . Bevacizumab (AVASTIN) SOLN 1.25 mg Intravitreal  . Bevacizumab (AVASTIN) SOLN 1.25 mg Intravitreal  . Bevacizumab (AVASTIN) SOLN 1.25 mg Intravitreal  . Bevacizumab (AVASTIN) SOLN 1.25 mg Intravitreal  . Bevacizumab (AVASTIN) SOLN 1.25 mg Intravitreal  . Bevacizumab (AVASTIN) SOLN 1.25 mg Intravitreal      REVIEW OF SYSTEMS: ROS    Positive for: Cardiovascular, Eyes   Negative for: Constitutional, Gastrointestinal, Neurological, Skin, Genitourinary, Musculoskeletal, HENT, Endocrine, Respiratory, Psychiatric, Allergic/Imm, Heme/Lymph   Last edited by Matthew Folks, COA on 04/16/2018  1:14 PM. (History)       ALLERGIES Allergies  Allergen Reactions  . Ace Inhibitors Cough  . Caffeine Other (See Comments)    "shoots my BP up"  . Nsaids Other (See Comments)    GI upset    . Peanut-Containing Drug Products Other (See Comments)    headaches  . Ultram [Tramadol Hcl] Other (See Comments)    Headaches. Patient is unsure of this.  . Penicillins Itching and Rash  Has patient had a PCN reaction causing immediate rash, facial/tongue/throat swelling, SOB or lightheadedness with hypotension: No Has patient had a PCN reaction causing severe rash involving mucus membranes or skin necrosis: No Has patient had a PCN reaction that required hospitalization No Has patient had a PCN reaction occurring within the last 10 years: No If all of the above answers are "NO", then may proceed with Cephalosporin use.     PAST MEDICAL HISTORY Past Medical History:  Diagnosis Date  . Arrhythmia    palpitations  . Arthritis   . Cancer (HCC)    squamous cell Left hand  .  Complication of anesthesia    slow to wake up  . Headache    hx of migraines  . Hypertension    Past Surgical History:  Procedure Laterality Date  . APPENDECTOMY    . CATARACT EXTRACTION     OU about 10 yrs ago  . fibroadenoma left breast removal    . OTHER SURGICAL HISTORY     appendectomy  . TOTAL HIP ARTHROPLASTY Left 06/18/2016   Procedure: LEFT TOTAL HIP ARTHROPLASTY ANTERIOR APPROACH;  Surgeon: Gaynelle Arabian, MD;  Location: [facility];  Service: Orthopedics;  Laterality: Left;  . UTERINE FIBROID SURGERY      FAMILY HISTORY Family History  Problem Relation Age of Onset  . Heart disease Mother   . Heart attack Mother   . Heart failure Mother   . Stroke Father   . Heart attack Maternal Aunt   . Heart disease Maternal Aunt   . Heart failure Maternal Aunt   . Amblyopia Neg Hx   . Blindness Neg Hx   . Cataracts Neg Hx   . Glaucoma Neg Hx   . Macular degeneration Neg Hx   . Retinal detachment Neg Hx   . Strabismus Neg Hx   . Retinitis pigmentosa Neg Hx     SOCIAL HISTORY Social History   Tobacco Use  . Smoking status: Former Research scientist (life sciences)  . Smokeless tobacco: Never Used  . Tobacco comment: years ago 50 plus years  Substance Use Topics  . Alcohol use: No  . Drug use: No         OPHTHALMIC EXAM:  Base Eye Exam    Visual Acuity (Snellen - Linear)      Right Left   Dist cc 20/20 -2 20/25 -2   Dist ph cc  NI   Correction:  Glasses       Tonometry (Tonopen, 1:15 PM)      Right Left   Pressure 15 15       Pupils      Dark Light Shape React APD   Right 2 1.5 Round Slow None   Left 2 1.5 Round Slow None       Visual Fields (Counting fingers)      Left Right    Full Full       Extraocular Movement      Right Left    Full, Ortho Full, Ortho       Neuro/Psych    Oriented x3:  Yes   Mood/Affect:  Normal       Dilation    Both eyes:  1.0% Mydriacyl, 2.5% Phenylephrine @ 1:15 PM        Slit Lamp and Fundus Exam    Slit Lamp Exam      Right Left    Lids/Lashes Dermatochalasis - upper lid, Meibomian gland dysfunction, Scurf Dermatochalasis - upper lid, Meibomian gland dysfunction  Conjunctiva/Sclera White and quiet White and quiet   Cornea Arcus, irregular epithelium inferiorly, 2+ Punctate epithelial erosions, Debris in tear film, Well healed cataract wounds, nasal and temporal LRI Mild arcus, Irregular epithelium inferiorly, 2+ Punctate epithelial erosions, nasal and temporal LRI, Debris in tear film   Anterior Chamber Deep and quiet Deep and quiet   Iris Round and moderately dilated Round and moderately dilated    Lens PC IOL in good position with open PC PC IOL in good position, open PC   Vitreous Vitreous syneresis Vitreous syneresis, Posterior vitreous detachment       Fundus Exam      Right Left   Disc focal hyperema temporally and inferiorly, mild tilt, mild temporal PPA Tilted, +collaterol vessel temporally, vascular loops, inferior rim thinning   C/D Ratio 0.3 0.65   Macula Blunted foveal reflex, Retinal pigment epithelial mottling, No heme or edema Blunted foveal reflex - improved, central IRH/fine exudate   Vessels Inferior vessels 3+tortuosity, Vascular attenuation, Impending BRVO inferiorly Vascular attenuation, Tortuous, AV crossing changes   Periphery Attached, scattered MAs Attached, scattered MAs          IMAGING AND PROCEDURES  Imaging and Procedures for 05/25/17  OCT, Retina - OU - Both Eyes       Right Eye Quality was good. Central Foveal Thickness: 279. Progression has been stable. Findings include normal foveal contour, no IRF, no SRF, epiretinal membrane, intraretinal hyper-reflective material (Trace cystic change).   Left Eye Quality was good. Central Foveal Thickness: 439. Progression has worsened. Findings include epiretinal membrane, no SRF, no IRF, abnormal foveal contour, intraretinal fluid (Interval development of IRF/CME).   Notes *Images captured and stored on drive  Diagnosis / Impression:   OD: NFP, No IRF/SRF, trace ERM OS: interval re-development of IRF/CME (lost to f/u for 8 mos)  Clinical management:  See below  Abbreviations: NFP - Normal foveal profile. CME - cystoid macular edema. PED - pigment epithelial detachment. IRF - intraretinal fluid. SRF - subretinal fluid. EZ - ellipsoid zone. ERM - epiretinal membrane. ORA - outer retinal atrophy. ORT - outer retinal tubulation. SRHM - subretinal hyper-reflective material         Intravitreal Injection, Pharmacologic Agent - OS - Left Eye       Time Out 04/16/2018. 1:49 PM. Confirmed correct patient, procedure, site, and patient consented.   Anesthesia Topical anesthesia was used. Anesthetic medications included Lidocaine 2%, Proparacaine 0.5%.   Procedure Preparation included 5% betadine to ocular surface, eyelid speculum. A 30 gauge needle was used.   Injection:  1.25 mg Bevacizumab (AVASTIN) SOLN   NDC: 87867-672-09, Lot: [id number] , Expiration date: 06/16/2018   Route: Intravitreal, Site: Left Eye, Waste: 0 mg  Post-op Post injection exam found visual acuity of at least counting fingers. The patient tolerated the procedure well. There were no complications.                 ASSESSMENT/PLAN:    ICD-10-CM   1. Branch retinal vein occlusion of left eye with macular edema H34.8320 Intravitreal Injection, Pharmacologic Agent - OS - Left Eye    Bevacizumab (AVASTIN) SOLN 1.25 mg  2. Retinal edema H35.81 OCT, Retina - OU - Both Eyes  3. Essential hypertension I10   4. Hypertensive retinopathy of both eyes H35.033   5. Posterior vitreous detachment of left eye H43.812   6. Glaucoma suspect of left eye H40.002   7. Pseudophakia of both eyes Z96.1   8. PCO (posterior capsular opacification), left H26.492  1,2. BRVO w/ macular edema OS - on presenting exam, superior temporal venule was dilated and tortuous but no significant intraretinal hemorrhages suggesting perhaps a remote BRVO event has  occurred - FA at presentation (01.23.19) shows significant leakage from branches from the superior temporal venules corresponding to central cystic changes noted on OCT - S/P IVA OS #1 (01.23.19), #2 (02.25.19), #3 (04.01.19), #4 (05.07.19) -- #5 (06.25.19) - lost to follow up since June 2019 - OCT today shows interval recurrence of IRF - interestingly, BCVA 20/25 - recommend IVA OS #6 today (02.21.20)  - RBA of procedure discussed, questions answered - informed consent obtained and signed - see procedure note - F/U 6 weeks -- DFE/OCT/possible injection - pt having right hip replacement surgery in early March, may impact f/u  3,4. Hypertensive retinopathy OU - discussed importance of tight BP control - monitor  5. PVD OS - Discussed findings and prognosis - No RT or RD on 360 peripheral exam - Reviewed s/s of RT/RD - Strict return precautions for any such RT/RD signs/symptoms  6. Glaucoma Suspect OS - under the expert management of Dr. Kathlen Mody - seen in consultation on July 25th 2019  7, 8. Pseudophakia OU - s/p CE/IOL OU [facility] yrs ago at [facility] - s/p YAG capsulotomy OU - OS re-YAG'd by Dr. Kathlen Mody -- PC opening improved - monitor   Ophthalmic Meds Ordered this visit:  Meds ordered this encounter  Medications  . Bevacizumab (AVASTIN) SOLN 1.25 mg      Return in about 6 weeks (around 05/28/2018) for BRVO OS.  There are no Patient Instructions on file for this visit.   Explained the diagnoses, plan, and follow up with the patient and they expressed understanding.  Patient expressed understanding of the importance of proper follow up care.   This document serves as a record of services personally performed by Gardiner Sleeper, MD, PhD. It was created on their behalf by Ernest Mallick, OA, an ophthalmic assistant. The creation of this record is the provider's dictation and/or activities during the visit.    Electronically signed by: Ernest Mallick, OA  02.21.2020 4:05 PM     Gardiner Sleeper, M.D., Ph.D. Diseases & Surgery of the Retina and Vitreous [facility]  I have reviewed the above documentation for accuracy and completeness, and I agree with the above. Gardiner Sleeper, M.D., Ph.D. 04/18/18 4:11 PM   Abbreviations: M myopia (nearsighted); A astigmatism; H hyperopia (farsighted); P presbyopia; Mrx spectacle prescription;  CTL contact lenses; OD right eye; OS left eye; OU both eyes  XT exotropia; ET esotropia; PEK punctate epithelial keratitis; PEE punctate epithelial erosions; DES dry eye syndrome; MGD meibomian gland dysfunction; ATs artificial tears; PFAT's preservative free artificial tears; [place] nuclear sclerotic cataract; PSC posterior subcapsular cataract; ERM epi-retinal membrane; PVD posterior vitreous detachment; RD retinal detachment; DM diabetes mellitus; DR diabetic retinopathy; NPDR non-proliferative diabetic retinopathy; PDR proliferative diabetic retinopathy; CSME clinically significant macular edema; DME diabetic macular edema; dbh dot blot hemorrhages; CWS cotton wool spot; POAG primary open angle glaucoma; C/D cup-to-disc ratio; HVF humphrey visual field; GVF goldmann visual field; OCT optical coherence tomography; IOP intraocular pressure; BRVO Branch retinal vein occlusion; CRVO central retinal vein occlusion; CRAO central retinal artery occlusion; BRAO branch retinal artery occlusion; RT retinal tear; SB scleral buckle; PPV pars plana vitrectomy; VH Vitreous hemorrhage; PRP panretinal laser photocoagulation; IVK intravitreal kenalog; VMT vitreomacular traction; MH Macular hole;  NVD neovascularization of the disc; NVE neovascularization elsewhere; AREDS  age related eye disease study; ARMD age related macular degeneration; POAG primary open angle glaucoma; EBMD epithelial/anterior basement membrane dystrophy; ACIOL anterior chamber intraocular lens; IOL intraocular lens; PCIOL posterior chamber intraocular lens; Phaco/IOL  phacoemulsification with intraocular lens placement; [place] photorefractive keratectomy; LASIK laser assisted in situ keratomileusis; HTN hypertension; DM diabetes mellitus; COPD chronic obstructive pulmonary disease

## 2018-04-18 ENCOUNTER — Encounter (INDEPENDENT_AMBULATORY_CARE_PROVIDER_SITE_OTHER): Payer: Self-pay | Admitting: Ophthalmology

## 2018-04-21 ENCOUNTER — Ambulatory Visit (INDEPENDENT_AMBULATORY_CARE_PROVIDER_SITE_OTHER): Payer: Medicare Other

## 2018-04-21 DIAGNOSIS — I350 Nonrheumatic aortic (valve) stenosis: Secondary | ICD-10-CM

## 2018-04-23 ENCOUNTER — Telehealth: Payer: Self-pay | Admitting: *Deleted

## 2018-04-23 NOTE — Telephone Encounter (Signed)
Notes recorded by Laurine Blazer, LPN on 06/06/8206 at 1:38 PM EST Patient notified. Copy to pmd & Dr. Maureen Ralphs. ------  Notes recorded by Herminio Commons, MD on 04/23/2018 at 3:23 PM EST Pumping function is normal. Calcium buildup on aortic valve leading to moderate narrowing.

## 2018-04-29 NOTE — Patient Instructions (Addendum)
Martha Richard  04/29/2018   Your procedure is scheduled on: 05-05-18    Report to [facility] Main  Entrance    Report to Admitting at 10:08 AM    Call this number if you have problems the morning of surgery [phone]    Remember: Do not eat food or drink liquids :After Midnight.    BRUSH YOUR TEETH MORNING OF SURGERY AND RINSE YOUR MOUTH OUT, NO CHEWING GUM CANDY OR MINTS.     Take these medicines the morning of surgery with A SIP OF WATER: None                                You may not have any metal on your body including hair pins and              piercings  Do not wear jewelry, make-up, lotions, powders or perfumes, deodorant             Do not wear nail polish.  Do not shave  48 hours prior to surgery.                 Do not bring valuables to the hospital. [place].  Contacts, dentures or bridgework may not be worn into surgery.  Leave suitcase in the car. After surgery it may be brought to your room.     Patients discharged the day of surgery will not be allowed to drive home. IF YOU ARE HAVING SURGERY AND GOING HOME THE SAME DAY, YOU MUST HAVE AN ADULT TO DRIVE YOU HOME AND BE WITH YOU FOR 24 HOURS. YOU MAY GO HOME BY TAXI OR UBER OR ORTHERWISE, BUT AN ADULT MUST ACCOMPANY YOU HOME AND STAY WITH YOU FOR 24 HOURS.    Special Instructions: N/A              Please read over the following fact sheets you were given: _____________________________________________________________________             [facility] - Preparing for Surgery Before surgery, you can play an important role.  Because skin is not sterile, your skin needs to be as free of germs as possible.  You can reduce the number of germs on your skin by washing with CHG (chlorahexidine gluconate) soap before surgery.  CHG is an antiseptic cleaner which kills germs and bonds with the skin to continue killing germs even after  washing. Please DO NOT use if you have an allergy to CHG or antibacterial soaps.  If your skin becomes reddened/irritated stop using the CHG and inform your nurse when you arrive at Short Stay. Do not shave (including legs and underarms) for at least 48 hours prior to the first CHG shower.  You may shave your face/neck. Please follow these instructions carefully:  1.  Shower with CHG Soap the night before surgery and the  morning of Surgery.  2.  If you choose to wash your hair, wash your hair first as usual with your  normal  shampoo.  3.  After you shampoo, rinse your hair and body thoroughly to remove the  shampoo.  4.  Use CHG as you would any other liquid soap.  You can apply chg directly  to the skin and wash                       Gently with a scrungie or clean washcloth.  5.  Apply the CHG Soap to your body ONLY FROM THE NECK DOWN.   Do not use on face/ open                           Wound or open sores. Avoid contact with eyes, ears mouth and genitals (private parts).                       Wash face,  Genitals (private parts) with your normal soap.             6.  Wash thoroughly, paying special attention to the area where your surgery  will be performed.  7.  Thoroughly rinse your body with warm water from the neck down.  8.  DO NOT shower/wash with your normal soap after using and rinsing off  the CHG Soap.                9.  Pat yourself dry with a clean towel.            10.  Wear clean pajamas.            11.  Place clean sheets on your bed the night of your first shower and do not  sleep with pets. Day of Surgery : Do not apply any lotions/deodorants the morning of surgery.  Please wear clean clothes to the hospital/surgery center.  FAILURE TO FOLLOW THESE INSTRUCTIONS MAY RESULT IN THE CANCELLATION OF YOUR SURGERY PATIENT SIGNATURE_________________________________  NURSE  SIGNATURE__________________________________  ________________________________________________________________________   Adam Phenix  An incentive spirometer is a tool that can help keep your lungs clear and active. This tool measures how well you are filling your lungs with each breath. Taking long deep breaths may help reverse or decrease the chance of developing breathing (pulmonary) problems (especially infection) following:  A long period of time when you are unable to move or be active. BEFORE THE PROCEDURE   If the spirometer includes an indicator to show your best effort, your nurse or respiratory therapist will set it to a desired goal.  If possible, sit up straight or lean slightly forward. Try not to slouch.  Hold the incentive spirometer in an upright position. INSTRUCTIONS FOR USE  1. Sit on the edge of your bed if possible, or sit up as far as you can in bed or on a chair. 2. Hold the incentive spirometer in an upright position. 3. Breathe out normally. 4. Place the mouthpiece in your mouth and seal your lips tightly around it. 5. Breathe in slowly and as deeply as possible, raising the piston or the ball toward the top of the column. 6. Hold your breath for 3-5 seconds or for as long as possible. Allow the piston or ball to fall to the bottom of the column. 7. Remove the mouthpiece from your mouth and breathe out normally. 8. Rest for a few seconds and repeat Steps 1 through 7 at least 10 times every 1-2 hours when you are awake. Take your time and take a few normal breaths between deep breaths. 9. The spirometer may include an indicator to show  your best effort. Use the indicator as a goal to work toward during each repetition. 10. After each set of 10 deep breaths, practice coughing to be sure your lungs are clear. If you have an incision (the cut made at the time of surgery), support your incision when coughing by placing a pillow or rolled up towels firmly  against it. Once you are able to get out of bed, walk around indoors and cough well. You may stop using the incentive spirometer when instructed by your caregiver.  RISKS AND COMPLICATIONS  Take your time so you do not get dizzy or light-headed.  If you are in pain, you may need to take or ask for pain medication before doing incentive spirometry. It is harder to take a deep breath if you are having pain. AFTER USE  Rest and breathe slowly and easily.  It can be helpful to keep track of a log of your progress. Your caregiver can provide you with a simple table to help with this. If you are using the spirometer at home, follow these instructions: [place] IF:   You are having difficultly using the spirometer.  You have trouble using the spirometer as often as instructed.  Your pain medication is not giving enough relief while using the spirometer.  You develop fever of 100.5 F (38.1 C) or higher. SEEK IMMEDIATE MEDICAL CARE IF:   You cough up bloody sputum that had not been present before.  You develop fever of 102 F (38.9 C) or greater.  You develop worsening pain at or near the incision site. MAKE SURE YOU:   Understand these instructions.  Will watch your condition.  Will get help right away if you are not doing well or get worse. Document Released: 06/23/2006 Document Revised: 05/05/2011 Document Reviewed: 08/24/2006 ExitCare Patient Information 2014 Exit[employer], [place].   ________________________________________________________________________  WHAT IS A BLOOD TRANSFUSION? Blood Transfusion Information  A transfusion is the replacement of blood or some of its parts. Blood is made up of multiple cells which provide different functions.  Red blood cells carry oxygen and are used for blood loss replacement.  White blood cells fight against infection.  Platelets control bleeding.  Plasma helps clot blood.  Other blood products are available for  specialized needs, such as hemophilia or other clotting disorders. BEFORE THE TRANSFUSION  Who gives blood for transfusions?   Healthy volunteers who are fully evaluated to make sure their blood is safe. This is blood bank blood. Transfusion therapy is the safest it has ever been in the practice of medicine. Before blood is taken from a donor, a complete history is taken to make sure that person has no history of diseases nor engages in risky social behavior (examples are intravenous drug use or sexual activity with multiple partners). The donor's travel history is screened to minimize risk of transmitting infections, such as malaria. The donated blood is tested for signs of infectious diseases, such as HIV and hepatitis. The blood is then tested to be sure it is compatible with you in order to minimize the chance of a transfusion reaction. If you or a relative donates blood, this is often done in anticipation of surgery and is not appropriate for emergency situations. It takes many days to process the donated blood. RISKS AND COMPLICATIONS Although transfusion therapy is very safe and saves many lives, the main dangers of transfusion include:   Getting an infectious disease.  Developing a transfusion reaction. This is an allergic reaction to  something in the blood you were given. Every precaution is taken to prevent this. The decision to have a blood transfusion has been considered carefully by your caregiver before blood is given. Blood is not given unless the benefits outweigh the risks. AFTER THE TRANSFUSION  Right after receiving a blood transfusion, you will usually feel much better and more energetic. This is especially true if your red blood cells have gotten low (anemic). The transfusion raises the level of the red blood cells which carry oxygen, and this usually causes an energy increase.  The nurse administering the transfusion will monitor you carefully for complications. HOME CARE  INSTRUCTIONS  No special instructions are needed after a transfusion. You may find your energy is better. Speak with your caregiver about any limitations on activity for underlying diseases you may have. SEEK MEDICAL CARE IF:   Your condition is not improving after your transfusion.  You develop redness or irritation at the intravenous (IV) site. SEEK IMMEDIATE MEDICAL CARE IF:  Any of the following symptoms occur over the next 12 hours:  Shaking chills.  You have a temperature by mouth above 102 F (38.9 C), not controlled by medicine.  Chest, back, or muscle pain.  People around you feel you are not acting correctly or are confused.  Shortness of breath or difficulty breathing.  Dizziness and fainting.  You get a rash or develop hives.  You have a decrease in urine output.  Your urine turns a dark color or changes to pink, red, or brown. Any of the following symptoms occur over the next 10 days:  You have a temperature by mouth above 102 F (38.9 C), not controlled by medicine.  Shortness of breath.  Weakness after normal activity.  The white part of the eye turns yellow (jaundice).  You have a decrease in the amount of urine or are urinating less often.  Your urine turns a dark color or changes to pink, red, or brown. Document Released: 02/08/2000 Document Revised: 05/05/2011 Document Reviewed: 09/27/2007 [facility] Patient Information 2014 [place], [place].  _______________________________________________________________________

## 2018-04-30 ENCOUNTER — Other Ambulatory Visit: Payer: Self-pay

## 2018-04-30 ENCOUNTER — Encounter (HOSPITAL_COMMUNITY)
Admission: RE | Admit: 2018-04-30 | Discharge: 2018-04-30 | Disposition: A | Discharge status: Home / Self Care | Payer: Medicare Other | Source: Ambulatory Visit | Attending: Orthopedic Surgery | Admitting: Orthopedic Surgery

## 2018-04-30 ENCOUNTER — Encounter (HOSPITAL_COMMUNITY): Payer: Self-pay

## 2018-04-30 DIAGNOSIS — M1611 Unilateral primary osteoarthritis, right hip: Secondary | ICD-10-CM | POA: Insufficient documentation

## 2018-04-30 DIAGNOSIS — Z01812 Encounter for preprocedural laboratory examination: Secondary | ICD-10-CM | POA: Insufficient documentation

## 2018-04-30 LAB — CBC
HCT: 42.8 % (ref 36.0–46.0)
Hemoglobin: 13.1 g/dL (ref 12.0–15.0)
MCH: 29.3 pg (ref 26.0–34.0)
MCHC: 30.6 g/dL (ref 30.0–36.0)
MCV: 95.7 fL (ref 80.0–100.0)
Platelets: 281 10*3/uL (ref 150–400)
RBC: 4.47 MIL/uL (ref 3.87–5.11)
RDW: 13.4 % (ref 11.5–15.5)
WBC: 8.6 10*3/uL (ref 4.0–10.5)
nRBC: 0 % (ref 0.0–0.2)

## 2018-04-30 LAB — COMPREHENSIVE METABOLIC PANEL
ALT: 14 U/L (ref 0–44)
AST: 19 U/L (ref 15–41)
Albumin: 3.6 g/dL (ref 3.5–5.0)
Alkaline Phosphatase: 79 U/L (ref 38–126)
Anion gap: 6 (ref 5–15)
BUN: 25 mg/dL — ABNORMAL HIGH (ref 8–23)
CO2: 28 mmol/L (ref 22–32)
Calcium: 9.4 mg/dL (ref 8.9–10.3)
Chloride: 104 mmol/L (ref 98–111)
Creatinine, Ser: 0.84 mg/dL (ref 0.44–1.00)
GFR calc Af Amer: >60 mL/min (ref >60)
GFR calc non Af Amer: >60 mL/min (ref >60)
Glucose, Bld: 118 mg/dL — ABNORMAL HIGH (ref 70–99)
Potassium: 4.5 mmol/L (ref 3.5–5.1)
Sodium: 138 mmol/L (ref 135–145)
Total Bilirubin: 0.8 mg/dL (ref 0.3–1.2)
Total Protein: 6.5 g/dL (ref 6.5–8.1)

## 2018-04-30 LAB — SURGICAL PCR SCREEN
MRSA, PCR: NEGATIVE
Staphylococcus aureus: NEGATIVE

## 2018-04-30 LAB — APTT: aPTT: 28 seconds (ref 24–36)

## 2018-04-30 LAB — PROTIME-INR
INR: 1 (ref 0.8–1.2)
Prothrombin Time: 12.6 seconds (ref 11.4–15.2)

## 2018-04-30 NOTE — Progress Notes (Signed)
Surgical Clearance from Dr. Quintin Alto on chart  04-21-18 (Epic) ECHO  03-16-18 (Epic) EKG

## 2018-05-03 NOTE — Progress Notes (Signed)
PCP: Consuello Masse  CARDIOLOGIST: Dr. Christoper Fabian  INFO IN Epic:   04-21-18 (Epic) ECHO  03-16-18 (Epic) EKG  INFO ON CHART: Surgical Clearance from Dr. Quintin Alto on chart   BLOOD THINNERS AND LAST DOSES: ____________________________________  PATIENT SYMPTOMS AT TIME OF PREOP:   Moderate Stenosis based on 04-21-18 ECHO.LOV w/cardiologist 04-05-18

## 2018-05-03 NOTE — Progress Notes (Signed)
Anesthesia Chart Review   Case:  [id number] Date/Time:  05/05/18 1223   Procedure:  TOTAL HIP ARTHROPLASTY ANTERIOR APPROACH (Right )   Anesthesia type:  Choice   Pre-op diagnosis:  right hip osteoarthritis   Location:  [facility] ROOM 10 / [facility]S   Surgeon:  Gaynelle Arabian, MD      DISCUSSION: 85 yo former smoker with h/o migraines, HTN, LBBB, moderate AS (mean gradient 22.2 mmHg on Echo 04/21/18), reports slow to wake with previous anesthesia, right hip OA scheduled for above procedure 05/05/18 with Dr. Gaynelle Arabian.   Cardiac clearance requested by PCP Dr. Consuello Masse.  She was seen by cardiologist, Dr. Kate Sable 04/05/18.  Echo ordered which is stable.  Per Dr. Bronson Ing note, "She should be able to proceed with right hip replacement surgery with an acceptable level of risk."  Pt can proceed with planned procedure barring acute status change.  VS: BP (!) 144/62 (BP Location: Left Arm)   Pulse 73   Temp 36.6 C (Oral)   Resp 18   Ht 5\' 2"  (1.575 m)   Wt 71.8 kg   SpO2 100%   BMI 28.94 kg/m   PROVIDERS: Sasser, Silvestre Moment, MD is PCP   Rachael Fee, MD is cardiologist  LABS: Labs reviewed: Acceptable for surgery. (all labs ordered are listed, but only abnormal results are displayed)  Labs Reviewed  COMPREHENSIVE METABOLIC PANEL - Abnormal; Notable for the following components:      Result Value   Glucose, Bld 118 (*)    BUN 25 (*)    All other components within normal limits  SURGICAL PCR SCREEN  APTT  CBC  PROTIME-INR  TYPE AND SCREEN     IMAGES:   EKG: 03/16/18 Rate 69 bpm   CV: Echo 04/21/18 IMPRESSIONS   1. The left ventricle has normal systolic function with an ejection fraction of 60-65%. The cavity size was normal. There is mildly increased left ventricular wall thickness. Left ventricular diastolic Doppler parameters are consistent with impaired  relaxation Elevated mean left atrial pressure.  2. The right ventricle has normal systolic function. The  cavity was normal. There is no increase in right ventricular wall thickness.  3. Left atrial size was mildly dilated.  4. The pericardial effusion is circumferential.  5. Trivial pericardial effusion is present.  6. The aortic valve has an indeterminant number of cusps Moderate thickening of the aortic valve Moderate calcification of the aortic valve. Aortic valve regurgitation is mild by color flow Doppler. moderate stenosis of the aortic valve. Moderate aortic  annular calcification noted.  7. The mitral valve is normal in structure. Mild thickening of the mitral valve leaflet. Mild calcification of the mitral valve leaflet. There is mild mitral annular calcification present. No evidence of mitral valve stenosis.  8. The tricuspid valve is normal in structure.  9. The aortic root is normal in size and structure. 10. Pulmonary hypertension is normal, PASP is 24.  Stress Test 05/23/16  The study is normal. There are no perfusion defects  The left ventricular ejection fraction is hyperdynamic (>65%).  This is a low risk study.  There was no ST segment deviation noted during stress.  LBBB at baseline Past Medical History:  Diagnosis Date  . Arrhythmia    palpitations  . Arthritis   . Cancer (HCC)    squamous cell Left hand  . Complication of anesthesia    slow to wake up  . Headache    hx of migraines  .  Hypertension     Past Surgical History:  Procedure Laterality Date  . APPENDECTOMY    . CATARACT EXTRACTION     OU about 10 yrs ago  . fibroadenoma left breast removal    . OTHER SURGICAL HISTORY     appendectomy  . TOTAL HIP ARTHROPLASTY Left 06/18/2016   Procedure: LEFT TOTAL HIP ARTHROPLASTY ANTERIOR APPROACH;  Surgeon: Gaynelle Arabian, MD;  Location: [facility];  Service: Orthopedics;  Laterality: Left;  . UTERINE FIBROID SURGERY      MEDICATIONS: . hydrochlorothiazide (HYDRODIURIL) 12.5 MG tablet   . Bevacizumab (AVASTIN) SOLN 1.25 mg  . Bevacizumab (AVASTIN) SOLN  1.25 mg  . Bevacizumab (AVASTIN) SOLN 1.25 mg  . Bevacizumab (AVASTIN) SOLN 1.25 mg  . Bevacizumab (AVASTIN) SOLN 1.25 mg  . Bevacizumab (AVASTIN) SOLN 1.25 mg     Maia Plan [facility] Pre-Surgical Testing [phone] 05/03/18 12:39 PM

## 2018-05-03 NOTE — Progress Notes (Signed)
Labs called to report that pt's T&S was positive for antibodies for lab drawn on 05-03-18. Placing order for T&S for day of surgery.

## 2018-05-03 NOTE — Anesthesia Preprocedure Evaluation (Addendum)
Anesthesia Evaluation  Patient identified by MRN, date of birth, ID band Patient awake    Reviewed: Allergy & Precautions, NPO status , Patient's Chart, lab work & pertinent test results  History of Anesthesia Complications (+) history of anesthetic complications ("Slow to wake up")  Airway Mallampati: II  TM Distance: >3 FB Neck ROM: Full    Dental  (+) Dental Advisory Given, Caps, Partial Upper, Partial Lower   Pulmonary former smoker,    Pulmonary exam normal breath sounds clear to auscultation       Cardiovascular hypertension, Pt. on medications + Valvular Problems/Murmurs AS  Rhythm:Regular Rate:Normal + Systolic murmurs    Neuro/Psych  Headaches, negative psych ROS   GI/Hepatic negative GI ROS, Neg liver ROS,   Endo/Other  negative endocrine ROS  Renal/GU negative Renal ROS     Musculoskeletal  (+) Arthritis ,   Abdominal   Peds  Hematology negative hematology ROS (+) Plt 281k   Anesthesia Other Findings Day of surgery medications reviewed with the patient.  Reproductive/Obstetrics                           Anesthesia Physical Anesthesia Plan  ASA: III  Anesthesia Plan: General   Post-op Pain Management:    Induction: Intravenous  PONV Risk Score and Plan: 3 and Dexamethasone and Ondansetron  Airway Management Planned: Oral ETT  Additional Equipment:   Intra-op Plan:   Post-operative Plan: Extubation in OR  Informed Consent: I have reviewed the patients History and Physical, chart, labs and discussed the procedure including the risks, benefits and alternatives for the proposed anesthesia with the patient or authorized representative who has indicated his/her understanding and acceptance.     Dental advisory given  Plan Discussed with: CRNA  Anesthesia Plan Comments: (See PAT note 04/30/18, Konrad Felix, PA-C)      Anesthesia Quick Evaluation

## 2018-05-05 ENCOUNTER — Encounter (HOSPITAL_COMMUNITY): Payer: Self-pay | Admitting: Certified Registered"

## 2018-05-05 ENCOUNTER — Inpatient Hospital Stay (HOSPITAL_COMMUNITY): Payer: Medicare Other

## 2018-05-05 ENCOUNTER — Other Ambulatory Visit: Payer: Self-pay

## 2018-05-05 ENCOUNTER — Encounter (HOSPITAL_COMMUNITY)
Admission: RE | Disposition: A | Discharge status: Home / Self Care | Payer: Self-pay | Source: Home / Self Care | Attending: Orthopedic Surgery

## 2018-05-05 ENCOUNTER — Inpatient Hospital Stay (HOSPITAL_COMMUNITY): Payer: Medicare Other | Admitting: Physician Assistant

## 2018-05-05 ENCOUNTER — Inpatient Hospital Stay (HOSPITAL_COMMUNITY): Payer: Medicare Other | Admitting: Certified Registered"

## 2018-05-05 ENCOUNTER — Inpatient Hospital Stay (HOSPITAL_COMMUNITY)
Admission: RE | Admit: 2018-05-05 | Discharge: 2018-05-07 | DRG: 470 | Disposition: A | Discharge status: Home / Self Care | Payer: Medicare Other | Attending: Orthopedic Surgery | Admitting: Orthopedic Surgery

## 2018-05-05 DIAGNOSIS — Z96642 Presence of left artificial hip joint: Secondary | ICD-10-CM | POA: Diagnosis present

## 2018-05-05 DIAGNOSIS — Z888 Allergy status to other drugs, medicaments and biological substances status: Secondary | ICD-10-CM

## 2018-05-05 DIAGNOSIS — Z885 Allergy status to narcotic agent status: Secondary | ICD-10-CM | POA: Diagnosis not present

## 2018-05-05 DIAGNOSIS — I10 Essential (primary) hypertension: Secondary | ICD-10-CM | POA: Diagnosis not present

## 2018-05-05 DIAGNOSIS — Z8249 Family history of ischemic heart disease and other diseases of the circulatory system: Secondary | ICD-10-CM | POA: Diagnosis not present

## 2018-05-05 DIAGNOSIS — Z85828 Personal history of other malignant neoplasm of skin: Secondary | ICD-10-CM | POA: Diagnosis not present

## 2018-05-05 DIAGNOSIS — Z823 Family history of stroke: Secondary | ICD-10-CM

## 2018-05-05 DIAGNOSIS — M169 Osteoarthritis of hip, unspecified: Secondary | ICD-10-CM | POA: Diagnosis present

## 2018-05-05 DIAGNOSIS — Z96641 Presence of right artificial hip joint: Secondary | ICD-10-CM | POA: Diagnosis not present

## 2018-05-05 DIAGNOSIS — Z886 Allergy status to analgesic agent status: Secondary | ICD-10-CM

## 2018-05-05 DIAGNOSIS — Z419 Encounter for procedure for purposes other than remedying health state, unspecified: Secondary | ICD-10-CM

## 2018-05-05 DIAGNOSIS — Z880 Allergy status to penicillin: Secondary | ICD-10-CM | POA: Diagnosis not present

## 2018-05-05 DIAGNOSIS — G43909 Migraine, unspecified, not intractable, without status migrainosus: Secondary | ICD-10-CM | POA: Diagnosis present

## 2018-05-05 DIAGNOSIS — M1611 Unilateral primary osteoarthritis, right hip: Secondary | ICD-10-CM | POA: Diagnosis not present

## 2018-05-05 DIAGNOSIS — Z91010 Allergy to peanuts: Secondary | ICD-10-CM

## 2018-05-05 DIAGNOSIS — Z87891 Personal history of nicotine dependence: Secondary | ICD-10-CM | POA: Diagnosis not present

## 2018-05-05 DIAGNOSIS — M25551 Pain in right hip: Secondary | ICD-10-CM | POA: Diagnosis present

## 2018-05-05 DIAGNOSIS — Z471 Aftercare following joint replacement surgery: Secondary | ICD-10-CM | POA: Diagnosis not present

## 2018-05-05 DIAGNOSIS — Z96649 Presence of unspecified artificial hip joint: Secondary | ICD-10-CM

## 2018-05-05 HISTORY — PX: TOTAL HIP ARTHROPLASTY: SHX124

## 2018-05-05 LAB — TYPE AND SCREEN
ABO/RH(D): O POS
Antibody Screen: POSITIVE
Unit division: 0
Unit division: 0

## 2018-05-05 LAB — BPAM RBC
Blood Product Expiration Date: 202003302359
Blood Product Expiration Date: 202003312359
ISSUE DATE / TIME: 202003041231
Unit Type and Rh: 5100
Unit Type and Rh: 5100

## 2018-05-05 SURGERY — ARTHROPLASTY, HIP, TOTAL, ANTERIOR APPROACH
Anesthesia: General | Site: Hip | Laterality: Right

## 2018-05-05 MED ORDER — LIDOCAINE 2% (20 MG/ML) 5 ML SYRINGE
INTRAMUSCULAR | Status: AC
  Filled 2018-05-05: qty 5

## 2018-05-05 MED ORDER — HYDROCODONE-ACETAMINOPHEN 5-325 MG PO TABS
1.0000 | ORAL_TABLET | ORAL | Status: DC | PRN
  Administered 2018-05-05 – 2018-05-07 (×6): 1 via ORAL
  Filled 2018-05-05 (×6): qty 1

## 2018-05-05 MED ORDER — HYDRALAZINE HCL 20 MG/ML IJ SOLN
10.0000 mg | Freq: Once | INTRAMUSCULAR | Status: AC
  Administered 2018-05-05: 10 mg via INTRAVENOUS

## 2018-05-05 MED ORDER — PROPOFOL 10 MG/ML IV BOLUS
INTRAVENOUS | Status: DC | PRN
  Administered 2018-05-05: 160 mg via INTRAVENOUS

## 2018-05-05 MED ORDER — HYDROCHLOROTHIAZIDE 25 MG PO TABS: 12.5000 mg | ORAL_TABLET | Freq: Every day | ORAL | Status: DC | PRN

## 2018-05-05 MED ORDER — METHOCARBAMOL 500 MG IVPB - SIMPLE MED
500.0000 mg | Freq: Four times a day (QID) | INTRAVENOUS | Status: DC | PRN
  Administered 2018-05-05: 500 mg via INTRAVENOUS
  Filled 2018-05-05: qty 50

## 2018-05-05 MED ORDER — BUPIVACAINE-EPINEPHRINE (PF) 0.25% -1:200000 IJ SOLN
INTRAMUSCULAR | Status: DC | PRN
  Administered 2018-05-05: 30 mL

## 2018-05-05 MED ORDER — MENTHOL 3 MG MT LOZG: 1.0000 | LOZENGE | OROMUCOSAL | Status: DC | PRN

## 2018-05-05 MED ORDER — PROPOFOL 10 MG/ML IV BOLUS
INTRAVENOUS | Status: AC
  Filled 2018-05-05: qty 40

## 2018-05-05 MED ORDER — RIVAROXABAN 10 MG PO TABS
10.0000 mg | ORAL_TABLET | Freq: Every day | ORAL | Status: DC
  Administered 2018-05-06 – 2018-05-07 (×2): 10 mg via ORAL
  Filled 2018-05-05 (×2): qty 1

## 2018-05-05 MED ORDER — STERILE WATER FOR IRRIGATION IR SOLN
Status: DC | PRN
  Administered 2018-05-05: 2000 mL

## 2018-05-05 MED ORDER — DEXAMETHASONE SODIUM PHOSPHATE 10 MG/ML IJ SOLN
8.0000 mg | Freq: Once | INTRAMUSCULAR | Status: AC
  Administered 2018-05-05: 8 mg via INTRAVENOUS

## 2018-05-05 MED ORDER — BISACODYL 10 MG RE SUPP: 10.0000 mg | Freq: Every day | RECTAL | Status: DC | PRN

## 2018-05-05 MED ORDER — ROCURONIUM BROMIDE 10 MG/ML (PF) SYRINGE
PREFILLED_SYRINGE | INTRAVENOUS | Status: DC | PRN
  Administered 2018-05-05: 50 mg via INTRAVENOUS

## 2018-05-05 MED ORDER — LIDOCAINE 2% (20 MG/ML) 5 ML SYRINGE
INTRAMUSCULAR | Status: DC | PRN
  Administered 2018-05-05: 100 mg via INTRAVENOUS

## 2018-05-05 MED ORDER — TRANEXAMIC ACID-NACL 1000-0.7 MG/100ML-% IV SOLN
1000.0000 mg | INTRAVENOUS | Status: AC
  Administered 2018-05-05: 1000 mg via INTRAVENOUS
  Filled 2018-05-05: qty 100

## 2018-05-05 MED ORDER — DEXAMETHASONE SODIUM PHOSPHATE 10 MG/ML IJ SOLN
10.0000 mg | Freq: Once | INTRAMUSCULAR | Status: AC
  Administered 2018-05-06: 10 mg via INTRAVENOUS
  Filled 2018-05-05: qty 1

## 2018-05-05 MED ORDER — POLYETHYLENE GLYCOL 3350 17 G PO PACK: 17.0000 g | PACK | Freq: Every day | ORAL | Status: DC | PRN

## 2018-05-05 MED ORDER — ROCURONIUM BROMIDE 10 MG/ML (PF) SYRINGE
PREFILLED_SYRINGE | INTRAVENOUS | Status: AC
  Filled 2018-05-05: qty 10

## 2018-05-05 MED ORDER — ONDANSETRON HCL 4 MG/2ML IJ SOLN
INTRAMUSCULAR | Status: DC | PRN
  Administered 2018-05-05: 4 mg via INTRAVENOUS

## 2018-05-05 MED ORDER — FENTANYL CITRATE (PF) 100 MCG/2ML IJ SOLN
25.0000 ug | INTRAMUSCULAR | Status: DC | PRN
  Administered 2018-05-05: 50 ug via INTRAVENOUS
  Administered 2018-05-05 (×2): 25 ug via INTRAVENOUS

## 2018-05-05 MED ORDER — ACETAMINOPHEN 500 MG PO TABS
500.0000 mg | ORAL_TABLET | Freq: Four times a day (QID) | ORAL | Status: AC
  Administered 2018-05-05 – 2018-05-06 (×3): 500 mg via ORAL
  Filled 2018-05-05 (×3): qty 1

## 2018-05-05 MED ORDER — METHOCARBAMOL 500 MG PO TABS
500.0000 mg | ORAL_TABLET | Freq: Four times a day (QID) | ORAL | Status: DC | PRN
  Administered 2018-05-05 – 2018-05-06 (×2): 500 mg via ORAL
  Filled 2018-05-05 (×2): qty 1

## 2018-05-05 MED ORDER — ONDANSETRON HCL 4 MG/2ML IJ SOLN
INTRAMUSCULAR | Status: AC
  Filled 2018-05-05: qty 2

## 2018-05-05 MED ORDER — DEXAMETHASONE SODIUM PHOSPHATE 10 MG/ML IJ SOLN
INTRAMUSCULAR | Status: AC
  Filled 2018-05-05: qty 1

## 2018-05-05 MED ORDER — METOCLOPRAMIDE HCL 5 MG PO TABS: 5.0000 mg | ORAL_TABLET | Freq: Three times a day (TID) | ORAL | Status: DC | PRN

## 2018-05-05 MED ORDER — DOCUSATE SODIUM 100 MG PO CAPS
100.0000 mg | ORAL_CAPSULE | Freq: Two times a day (BID) | ORAL | Status: DC
  Administered 2018-05-05 – 2018-05-07 (×4): 100 mg via ORAL
  Filled 2018-05-05 (×4): qty 1

## 2018-05-05 MED ORDER — SUGAMMADEX SODIUM 200 MG/2ML IV SOLN
INTRAVENOUS | Status: AC
  Filled 2018-05-05: qty 2

## 2018-05-05 MED ORDER — HYDRALAZINE HCL 20 MG/ML IJ SOLN
INTRAMUSCULAR | Status: AC
  Filled 2018-05-05: qty 1

## 2018-05-05 MED ORDER — FENTANYL CITRATE (PF) 100 MCG/2ML IJ SOLN
INTRAMUSCULAR | Status: AC
  Filled 2018-05-05: qty 2

## 2018-05-05 MED ORDER — MORPHINE SULFATE (PF) 2 MG/ML IV SOLN: 0.5000 mg | INTRAVENOUS | Status: DC | PRN

## 2018-05-05 MED ORDER — TRANEXAMIC ACID-NACL 1000-0.7 MG/100ML-% IV SOLN
1000.0000 mg | Freq: Once | INTRAVENOUS | Status: AC
  Administered 2018-05-05: 1000 mg via INTRAVENOUS
  Filled 2018-05-05: qty 100

## 2018-05-05 MED ORDER — BUPIVACAINE-EPINEPHRINE (PF) 0.25% -1:200000 IJ SOLN
INTRAMUSCULAR | Status: AC
  Filled 2018-05-05: qty 30

## 2018-05-05 MED ORDER — LABETALOL HCL 5 MG/ML IV SOLN
INTRAVENOUS | Status: AC
  Administered 2018-05-05: 10 mg via INTRAVENOUS
  Filled 2018-05-05: qty 4

## 2018-05-05 MED ORDER — CEFAZOLIN SODIUM-DEXTROSE 1-4 GM/50ML-% IV SOLN
1.0000 g | Freq: Four times a day (QID) | INTRAVENOUS | Status: AC
  Administered 2018-05-05 (×2): 1 g via INTRAVENOUS
  Filled 2018-05-05 (×2): qty 50

## 2018-05-05 MED ORDER — DIPHENHYDRAMINE HCL 12.5 MG/5ML PO ELIX: 12.5000 mg | ORAL_SOLUTION | ORAL | Status: DC | PRN

## 2018-05-05 MED ORDER — FLEET ENEMA 7-19 GM/118ML RE ENEM: 1.0000 | ENEMA | Freq: Once | RECTAL | Status: DC | PRN

## 2018-05-05 MED ORDER — METHOCARBAMOL 500 MG IVPB - SIMPLE MED
INTRAVENOUS | Status: AC
  Filled 2018-05-05: qty 50

## 2018-05-05 MED ORDER — METOCLOPRAMIDE HCL 5 MG/ML IJ SOLN: 5.0000 mg | Freq: Three times a day (TID) | INTRAMUSCULAR | Status: DC | PRN

## 2018-05-05 MED ORDER — FENTANYL CITRATE (PF) 100 MCG/2ML IJ SOLN
INTRAMUSCULAR | Status: DC | PRN
  Administered 2018-05-05: 100 ug via INTRAVENOUS
  Administered 2018-05-05 (×3): 50 ug via INTRAVENOUS
  Administered 2018-05-05: 100 ug via INTRAVENOUS

## 2018-05-05 MED ORDER — ACETAMINOPHEN 10 MG/ML IV SOLN
1000.0000 mg | Freq: Four times a day (QID) | INTRAVENOUS | Status: DC
  Administered 2018-05-05: 1000 mg via INTRAVENOUS
  Filled 2018-05-05: qty 100

## 2018-05-05 MED ORDER — SODIUM CHLORIDE 0.9 % IV SOLN
INTRAVENOUS | Status: DC
  Administered 2018-05-05: 1000 mL via INTRAVENOUS
  Administered 2018-05-05: via INTRAVENOUS

## 2018-05-05 MED ORDER — ONDANSETRON HCL 4 MG/2ML IJ SOLN: 4.0000 mg | Freq: Once | INTRAMUSCULAR | Status: DC | PRN

## 2018-05-05 MED ORDER — SUGAMMADEX SODIUM 200 MG/2ML IV SOLN
INTRAVENOUS | Status: DC | PRN
  Administered 2018-05-05: 150 mg via INTRAVENOUS

## 2018-05-05 MED ORDER — PHENOL 1.4 % MT LIQD
1.0000 | OROMUCOSAL | Status: DC | PRN
  Filled 2018-05-05: qty 177

## 2018-05-05 MED ORDER — FENTANYL CITRATE (PF) 250 MCG/5ML IJ SOLN
INTRAMUSCULAR | Status: AC
  Filled 2018-05-05: qty 5

## 2018-05-05 MED ORDER — LACTATED RINGERS IV SOLN
INTRAVENOUS | Status: DC | PRN
  Administered 2018-05-05 (×2): via INTRAVENOUS

## 2018-05-05 MED ORDER — CEFAZOLIN SODIUM-DEXTROSE 2-4 GM/100ML-% IV SOLN
2.0000 g | Freq: Three times a day (TID) | INTRAVENOUS | Status: DC
  Administered 2018-05-05: 2 g via INTRAVENOUS
  Filled 2018-05-05: qty 100

## 2018-05-05 MED ORDER — LABETALOL HCL 5 MG/ML IV SOLN
10.0000 mg | INTRAVENOUS | Status: DC | PRN
  Administered 2018-05-05: 10 mg via INTRAVENOUS

## 2018-05-05 MED ORDER — CHLORHEXIDINE GLUCONATE 4 % EX LIQD: 60.0000 mL | Freq: Once | CUTANEOUS | Status: DC

## 2018-05-05 MED ORDER — 0.9 % SODIUM CHLORIDE (POUR BTL) OPTIME
TOPICAL | Status: DC | PRN
  Administered 2018-05-05: 1000 mL

## 2018-05-05 MED ORDER — FENTANYL CITRATE (PF) 100 MCG/2ML IJ SOLN
INTRAMUSCULAR | Status: AC
  Administered 2018-05-05: 50 ug via INTRAVENOUS
  Filled 2018-05-05: qty 2

## 2018-05-05 MED ORDER — ONDANSETRON HCL 4 MG PO TABS
4.0000 mg | ORAL_TABLET | Freq: Four times a day (QID) | ORAL | Status: DC | PRN
  Administered 2018-05-05: 4 mg via ORAL
  Filled 2018-05-05: qty 1

## 2018-05-05 MED ORDER — ONDANSETRON HCL 4 MG/2ML IJ SOLN: 4.0000 mg | Freq: Four times a day (QID) | INTRAMUSCULAR | Status: DC | PRN

## 2018-05-05 MED ORDER — SODIUM CHLORIDE 0.9 % IV SOLN
INTRAVENOUS | Status: DC
  Administered 2018-05-05: 11:00:00 via INTRAVENOUS

## 2018-05-05 SURGICAL SUPPLY — 49 items
BAG DECANTER FOR FLEXI CONT (MISCELLANEOUS) ×1 IMPLANT
BAG ZIPLOCK 12X15 (MISCELLANEOUS) IMPLANT
BLADE SAG 18X100X1.27 (BLADE) ×3 IMPLANT
BLADE SURG SZ10 CARB STEEL (BLADE) ×2 IMPLANT
CHLORAPREP W/TINT 26 (MISCELLANEOUS) ×3 IMPLANT
CLOSURE WOUND 1/2 X4 (GAUZE/BANDAGES/DRESSINGS) ×2
COVER PERINEAL POST (MISCELLANEOUS) ×3 IMPLANT
COVER SURGICAL LIGHT HANDLE (MISCELLANEOUS) ×3 IMPLANT
COVER WAND RF STERILE (DRAPES) IMPLANT
CUP ACETBLR 48 OD SECTOR II (Hips) ×2 IMPLANT
DECANTER SPIKE VIAL GLASS SM (MISCELLANEOUS) ×3 IMPLANT
DRAPE STERI IOBAN 125X83 (DRAPES) ×3 IMPLANT
DRAPE U-SHAPE 47X51 STRL (DRAPES) ×6 IMPLANT
DRSG ADAPTIC 3X8 NADH LF (GAUZE/BANDAGES/DRESSINGS) ×3 IMPLANT
DRSG MEPILEX BORDER 4X4 (GAUZE/BANDAGES/DRESSINGS) ×3 IMPLANT
DRSG MEPILEX BORDER 4X8 (GAUZE/BANDAGES/DRESSINGS) ×3 IMPLANT
ELECT BLADE TIP CTD 4 INCH (ELECTRODE) ×3 IMPLANT
ELECT REM PT RETURN 15FT ADLT (MISCELLANEOUS) ×3 IMPLANT
EVACUATOR 1/8 PVC DRAIN (DRAIN) ×3 IMPLANT
FEM STEM 12/14 TAPER SZ 4 HIP (Orthopedic Implant) ×3 IMPLANT
FEMORAL STEM 12/14 TPR SZ4 HIP (Orthopedic Implant) IMPLANT
GLOVE BIO SURGEON STRL SZ7 (GLOVE) ×3 IMPLANT
GLOVE BIO SURGEON STRL SZ8 (GLOVE) ×3 IMPLANT
GLOVE BIOGEL PI IND STRL 7.0 (GLOVE) ×1 IMPLANT
GLOVE BIOGEL PI IND STRL 7.5 (GLOVE) IMPLANT
GLOVE BIOGEL PI IND STRL 8 (GLOVE) ×1 IMPLANT
GLOVE BIOGEL PI INDICATOR 7.0 (GLOVE) ×2
GLOVE BIOGEL PI INDICATOR 7.5 (GLOVE) ×6
GLOVE BIOGEL PI INDICATOR 8 (GLOVE) ×2
GLOVE SURG SS PI 7.5 STRL IVOR (GLOVE) ×4 IMPLANT
GOWN SPEC L4 XLG W/TWL (GOWN DISPOSABLE) ×4 IMPLANT
GOWN STRL REUS W/TWL LRG LVL3 (GOWN DISPOSABLE) ×6 IMPLANT
HEAD FEM STD 28X+1.5 STRL (Hips) ×2 IMPLANT
HOLDER FOLEY CATH W/STRAP (MISCELLANEOUS) ×3 IMPLANT
KIT TURNOVER KIT A (KITS) IMPLANT
LINER MARATHON 28 48 (Hips) ×2 IMPLANT
MANIFOLD NEPTUNE II (INSTRUMENTS) ×3 IMPLANT
PACK ANTERIOR HIP CUSTOM (KITS) ×3 IMPLANT
STRIP CLOSURE SKIN 1/2X4 (GAUZE/BANDAGES/DRESSINGS) ×3 IMPLANT
SUT ETHIBOND NAB CT1 #1 30IN (SUTURE) ×3 IMPLANT
SUT MNCRL AB 4-0 PS2 18 (SUTURE) ×3 IMPLANT
SUT STRATAFIX 0 PDS 27 VIOLET (SUTURE) ×3
SUT VIC AB 2-0 CT1 27 (SUTURE) ×4
SUT VIC AB 2-0 CT1 TAPERPNT 27 (SUTURE) ×2 IMPLANT
SUTURE STRATFX 0 PDS 27 VIOLET (SUTURE) ×1 IMPLANT
SYR 50ML LL SCALE MARK (SYRINGE) IMPLANT
TRAY FOLEY CATH 14FRSI W/METER (CATHETERS) ×2 IMPLANT
TRAY FOLEY MTR SLVR 16FR STAT (SET/KITS/TRAYS/PACK) ×1 IMPLANT
YANKAUER SUCT BULB TIP 10FT TU (MISCELLANEOUS) ×3 IMPLANT

## 2018-05-05 NOTE — Progress Notes (Signed)
Patient from PACU with unreported left hand coban gauze and tape dressing. Hand and fingers very edematous and discolored blue. Immediately  taken off and elevated and neurovascular checked. pacu nurse called back and stated that a blood draw was done intraop on the ledt hand and that's all that was reported to her. Iv team consulted and AC called and observed. Dr Gifford Shave from anesthesia to room to see patient.  Bethann Punches RN

## 2018-05-05 NOTE — Plan of Care (Signed)
Plan of care 

## 2018-05-05 NOTE — Op Note (Signed)
OPERATIVE REPORT- TOTAL HIP ARTHROPLASTY   PREOPERATIVE DIAGNOSIS: Osteoarthritis of the Right hip.   POSTOPERATIVE DIAGNOSIS: Osteoarthritis of the Right  hip.   PROCEDURE: Right total hip arthroplasty, anterior approach.   SURGEON: Gaynelle Arabian, MD   ASSISTANT: Theresa Duty, PA-C  ANESTHESIA:  General  ESTIMATED BLOOD LOSS:-350 mL    DRAINS: Hemovac x1.   COMPLICATIONS: None   CONDITION: PACU - hemodynamically stable.   BRIEF CLINICAL NOTE: Martha Richard is a 85 y.o. female who has advanced end-  stage arthritis of their Right  hip with progressively worsening pain and  dysfunction.The patient has failed nonoperative management and presents for  total hip arthroplasty.   PROCEDURE IN DETAIL: After successful administration of spinal  anesthetic, the traction boots for the [facility] bed were placed on both  feet and the patient was placed onto the [facility] bed, boots placed into the leg  holders. The Right hip was then isolated from the perineum with plastic  drapes and prepped and draped in the usual sterile fashion. ASIS and  greater trochanter were marked and a oblique incision was made, starting  at about 1 cm lateral and 2 cm distal to the ASIS and coursing towards  the anterior cortex of the femur. The skin was cut with a 10 blade  through subcutaneous tissue to the level of the fascia overlying the  tensor fascia lata muscle. The fascia was then incised in line with the  incision at the junction of the anterior third and posterior 2/3rd. The  muscle was teased off the fascia and then the interval between the TFL  and the rectus was developed. The Hohmann retractor was then placed at  the top of the femoral neck over the capsule. The vessels overlying the  capsule were cauterized and the fat on top of the capsule was removed.  A Hohmann retractor was then placed anterior underneath the rectus  femoris to give exposure to the entire anterior capsule. A  T-shaped  capsulotomy was performed. The edges were tagged and the femoral head  was identified.       Osteophytes are removed off the superior acetabulum.  The femoral neck was then cut in situ with an oscillating saw. Traction  was then applied to the left lower extremity utilizing the [facility]  traction. The femoral head was then removed. Retractors were placed  around the acetabulum and then circumferential removal of the labrum was  performed. Osteophytes were also removed. Reaming starts at 45 mm to  medialize and  Increased in 2 mm increments to 47 mm. We reamed in  approximately 40 degrees of abduction, 20 degrees anteversion. A 48 mm  pinnacle acetabular shell was then impacted in anatomic position under  fluoroscopic guidance with excellent purchase. We did not need to place  any additional dome screws. A 28 mm neutral + 4 marathon liner was then  placed into the acetabular shell.       The femoral lift was then placed along the lateral aspect of the femur  just distal to the vastus ridge. The leg was  externally rotated and capsule  was stripped off the inferior aspect of the femoral neck down to the  level of the lesser trochanter, this was done with electrocautery. The femur was lifted after this was performed. The  leg was then placed in an extended and adducted position essentially delivering the femur. We also removed the capsule superiorly and the piriformis from the piriformis  fossa to gain excellent exposure of the  proximal femur. Rongeur was used to remove some cancellous bone to get  into the lateral portion of the proximal femur for placement of the  initial starter reamer. The starter broaches was placed  the starter broach  and was shown to go down the center of the canal. Broaching  with the Actis system was then performed starting at size 0  coursing  Up to size 4. A size 4 had excellent torsional and rotational  and axial stability. The trial high offset neck was then  placed  with a 28 + 1.5 trial head. The hip was then reduced. We confirmed that  the stem was in the canal both on AP and lateral x-rays. It also has excellent sizing. The hip was reduced with outstanding stability through full extension and full external rotation.. AP pelvis was taken and the leg lengths were measured and found to be equal. Hip was then dislocated again and the femoral head and neck removed. The  femoral broach was removed. Size 4 Actis stem with a high offset  neck was then impacted into the femur following native anteversion. Has  excellent purchase in the canal. Excellent torsional and rotational and  axial stability. It is confirmed to be in the canal on AP and lateral  fluoroscopic views. The 28 + 1.5 ceramic head was placed and the hip  reduced with outstanding stability. Again AP pelvis was taken and it  confirmed that the leg lengths were equal. The wound was then copiously  irrigated with saline solution and the capsule reattached and repaired  with Ethibond suture. 30 ml of .25% Bupivicaine was  injected into the capsule and into the edge of the tensor fascia lata as well as subcutaneous tissue. The fascia overlying the tensor fascia lata was then closed with a running #1 V-Loc. Subcu was closed with interrupted 2-0 Vicryl and subcuticular running 4-0 Monocryl. Incision was cleaned  and dried. Steri-Strips and a bulky sterile dressing applied. Hemovac  drain was hooked to suction and then the patient was awakened and transported to  recovery in stable condition.        Please note that a surgical assistant was a medical necessity for this procedure to perform it in a safe and expeditious manner. Assistant was necessary to provide appropriate retraction of vital neurovascular structures and to prevent femoral fracture and allow for anatomic placement of the prosthesis.  Gaynelle Arabian, M.D.

## 2018-05-05 NOTE — Progress Notes (Addendum)
LATE NOTE FROM PACU:  Blaine Hamper RN from [facility] contacted concerning L Hand.  As in operative record, CRNA WIlliford documented swelling of L hand after Type and Screen intra-op.  P Williford checked hand  2-3 times in PACU , no change.  Dr Gifford Shave contacted after transfer to floor to evaluate hand.

## 2018-05-05 NOTE — Anesthesia Procedure Notes (Signed)
Procedure Name: Intubation Date/Time: 05/05/2018 11:04 AM Performed by: Flemon Kelty D, CRNA Pre-anesthesia Checklist: Patient identified, Emergency Drugs available, Suction available and Patient being monitored Patient Re-evaluated:Patient Re-evaluated prior to induction Oxygen Delivery Method: Circle system utilized Preoxygenation: Pre-oxygenation with 100% oxygen Induction Type: IV induction Ventilation: Mask ventilation without difficulty Laryngoscope Size: Mac and 4 Grade View: Grade I Tube type: Oral Tube size: 7.0 mm Number of attempts: 1 Airway Equipment and Method: Stylet Placement Confirmation: ETT inserted through vocal cords under direct vision,  positive ETCO2 and breath sounds checked- equal and bilateral Secured at: 21 cm Tube secured with: Tape Dental Injury: Teeth and Oropharynx as per pre-operative assessment

## 2018-05-05 NOTE — Transfer of Care (Signed)
Immediate Anesthesia Transfer of Care Note  Patient: Martha Richard  Procedure(s) Performed: TOTAL HIP ARTHROPLASTY ANTERIOR APPROACH (Right Hip)  Patient Location: PACU  Anesthesia Type:General  Level of Consciousness: awake, alert  and oriented  Airway & Oxygen Therapy: Patient Spontanous Breathing and Patient connected to face mask oxygen  Post-op Assessment: Report given to RN and Post -op Vital signs reviewed and stable  Post vital signs: Reviewed and stable  Last Vitals:  Vitals Value Taken Time  BP 194/82 05/05/2018 12:30 PM  Temp    Pulse 95 05/05/2018 12:31 PM  Resp    SpO2 100 % 05/05/2018 12:31 PM  Vitals shown include unvalidated device data.  Last Pain:  Vitals:   05/05/18 1030  TempSrc:   PainSc: 3          Complications: No apparent anesthesia complications

## 2018-05-05 NOTE — Discharge Instructions (Addendum)
°Dr. Frank Aluisio °Total Joint Specialist °[facility] °[address] °[place], [place] [address] °[phone] ° °ANTERIOR APPROACH TOTAL HIP REPLACEMENT POSTOPERATIVE DIRECTIONS ° ° °Hip Rehabilitation, Guidelines Following Surgery  °The results of a hip operation are greatly improved after range of motion and muscle strengthening exercises. Follow all safety measures which are given to protect your hip. If any of these exercises cause increased pain or swelling in your joint, decrease the amount until you are comfortable again. Then slowly increase the exercises. Call your caregiver if you have problems or questions.  ° °HOME CARE INSTRUCTIONS  °• Remove items at home which could result in a fall. This includes throw rugs or furniture in walking pathways.  °· ICE to the affected hip every three hours for 30 minutes at a time and then as needed for pain and swelling.  Continue to use ice on the hip for pain and swelling from surgery. You may notice swelling that will progress down to the foot and ankle.  This is normal after surgery.  Elevate the leg when you are not up walking on it.   °· Continue to use the breathing machine which will help keep your temperature down.  It is common for your temperature to cycle up and down following surgery, especially at night when you are not up moving around and exerting yourself.  The breathing machine keeps your lungs expanded and your temperature down. ° °DIET °You may resume your previous home diet once your are discharged from the hospital. ° °DRESSING / WOUND CARE / SHOWERING °You may change your dressing 3-5 days after surgery.  Then change the dressing every day with sterile gauze.  Please use good hand washing techniques before changing the dressing.  Do not use any lotions or creams on the incision until instructed by your surgeon. °You may start showering once you are discharged home but do not submerge the incision under water. Just pat the  incision dry and apply a dry gauze dressing on daily. °Change the surgical dressing daily and reapply a dry dressing each time. ° °ACTIVITY °Walk with your walker as instructed. °Use walker as long as suggested by your caregivers. °Avoid periods of inactivity such as sitting longer than an hour when not asleep. This helps prevent blood clots.  °You may resume a sexual relationship in one month or when given the OK by your doctor.  °You may return to work once you are cleared by your doctor.  °Do not drive a car for 6 weeks or until released by you surgeon.  °Do not drive while taking narcotics. ° °WEIGHT BEARING °Weight bearing as tolerated with assist device (walker, cane, etc) as directed, use it as long as suggested by your surgeon or therapist, typically at least 4-6 weeks. ° °POSTOPERATIVE CONSTIPATION PROTOCOL °Constipation - defined medically as fewer than three stools per week and severe constipation as less than one stool per week. ° °One of the most common issues patients have following surgery is constipation.  Even if you have a regular bowel pattern at home, your normal regimen is likely to be disrupted due to multiple reasons following surgery.  Combination of anesthesia, postoperative narcotics, change in appetite and fluid intake all can affect your bowels.  In order to avoid complications following surgery, here are some recommendations in order to help you during your recovery period. ° °Colace (docusate) - Pick up an over-the-counter form of Colace or another stool softener and take twice a day   as long as you are requiring postoperative pain medications.  Take with a full glass of water daily.  If you experience loose stools or diarrhea, hold the colace until you stool forms back up.  If your symptoms do not get better within 1 week or if they get worse, check with your doctor. ° °Dulcolax (bisacodyl) - Pick up over-the-counter and take as directed by the product packaging as needed to assist with  the movement of your bowels.  Take with a full glass of water.  Use this product as needed if not relieved by Colace only.  ° °MiraLax (polyethylene glycol) - Pick up over-the-counter to have on hand.  MiraLax is a solution that will increase the amount of water in your bowels to assist with bowel movements.  Take as directed and can mix with a glass of water, juice, soda, coffee, or tea.  Take if you go more than two days without a movement. °Do not use MiraLax more than once per day. Call your doctor if you are still constipated or irregular after using this medication for 7 days in a row. ° °If you continue to have problems with postoperative constipation, please contact the office for further assistance and recommendations.  If you experience "the worst abdominal pain ever" or develop nausea or vomiting, please contact the office immediatly for further recommendations for treatment. ° °ITCHING ° If you experience itching with your medications, try taking only a single pain pill, or even half a pain pill at a time.  You can also use Benadryl over the counter for itching or also to help with sleep.  ° °TED HOSE STOCKINGS °Wear the elastic stockings on both legs for three weeks following surgery during the day but you may remove then at night for sleeping. ° °MEDICATIONS °See your medication summary on the “After Visit Summary” that the nursing staff will review with you prior to discharge.  You may have some home medications which will be placed on hold until you complete the course of blood thinner medication.  It is important for you to complete the blood thinner medication as prescribed by your surgeon.  Continue your approved medications as instructed at time of discharge. ° °PRECAUTIONS °If you experience chest pain or shortness of breath - call 911 immediately for transfer to the hospital emergency department.  °If you develop a fever greater that 101 F, purulent drainage from wound, increased redness or  drainage from wound, foul odor from the wound/dressing, or calf pain - CONTACT YOUR SURGEON.   °                                                °FOLLOW-UP APPOINTMENTS °Make sure you keep all of your appointments after your operation with your surgeon and caregivers. You should call the office at the above phone number and make an appointment for approximately two weeks after the date of your surgery or on the date instructed by your surgeon outlined in the "After Visit Summary". ° °RANGE OF MOTION AND STRENGTHENING EXERCISES  °These exercises are designed to help you keep full movement of your hip joint. Follow your caregiver's or physical therapist's instructions. Perform all exercises about fifteen times, three times per day or as directed. Exercise both hips, even if you have had only one joint replacement. These exercises can be done on   a training (exercise) mat, on the floor, on a table or on a bed. Use whatever works the best and is most comfortable for you. Use music or television while you are exercising so that the exercises are a pleasant break in your day. This will make your life better with the exercises acting as a break in routine you can look forward to.  °• Lying on your back, slowly slide your foot toward your buttocks, raising your knee up off the floor. Then slowly slide your foot back down until your leg is straight again.  °• Lying on your back spread your legs as far apart as you can without causing discomfort.  °• Lying on your side, raise your upper leg and foot straight up from the floor as far as is comfortable. Slowly lower the leg and repeat.  °• Lying on your back, tighten up the muscle in the front of your thigh (quadriceps muscles). You can do this by keeping your leg straight and trying to raise your heel off the floor. This helps strengthen the largest muscle supporting your knee.  °• Lying on your back, tighten up the muscles of your buttocks both with the legs straight and with  the knee bent at a comfortable angle while keeping your heel on the floor.  ° °IF YOU ARE TRANSFERRED TO A SKILLED REHAB FACILITY °If the patient is transferred to a skilled rehab facility following release from the hospital, a list of the current medications will be sent to the facility for the patient to continue.  When discharged from the skilled rehab facility, please have the facility set up the patient's Home Health Physical Therapy prior to being released. Also, the skilled facility will be responsible for providing the patient with their medications at time of release from the facility to include their pain medication, the muscle relaxants, and their blood thinner medication. If the patient is still at the rehab facility at time of the two week follow up appointment, the skilled rehab facility will also need to assist the patient in arranging follow up appointment in our office and any transportation needs. ° °MAKE SURE YOU:  °• Understand these instructions.  °• Get help right away if you are not doing well or get worse.  ° ° °Pick up stool softner and laxative for home use following surgery while on pain medications. °Do not submerge incision under water. °Please use good hand washing techniques while changing dressing each day. °May shower starting three days after surgery. °Please use a clean towel to pat the incision dry following showers. °Continue to use ice for pain and swelling after surgery. °Do not use any lotions or creams on the incision until instructed by your surgeon. ° °Information on my medicine - XARELTO® (Rivaroxaban) ° °Why was Xarelto® prescribed for you? °Xarelto® was prescribed for you to reduce the risk of blood clots forming after orthopedic surgery. The medical term for these abnormal blood clots is venous thromboembolism (VTE). ° °What do you need to know about xarelto® ? °Take your Xarelto® ONCE DAILY at the same time every day. °You may take it either with or without food. ° °If  you have difficulty swallowing the tablet whole, you may crush it and mix in applesauce just prior to taking your dose. ° °Take Xarelto® exactly as prescribed by your doctor and DO NOT stop taking Xarelto® without talking to the doctor who prescribed the medication.  Stopping without other VTE prevention medication to take the place   of Xarelto® may increase your risk of developing a clot. ° °After discharge, you should have regular check-up appointments with your healthcare provider that is prescribing your Xarelto®.   ° °What do you do if you miss a dose? °If you miss a dose, take it as soon as you remember on the same day then continue your regularly scheduled once daily regimen the next day. Do not take two doses of Xarelto® on the same day.  ° °Important Safety Information °A possible side effect of Xarelto® is bleeding. You should call your healthcare provider right away if you experience any of the following: °? Bleeding from an injury or your nose that does not stop. °? Unusual colored urine (red or dark brown) or unusual colored stools (red or black). °? Unusual bruising for unknown reasons. °? A serious fall or if you hit your head (even if there is no bleeding). ° °Some medicines may interact with Xarelto® and might increase your risk of bleeding while on Xarelto®. To help avoid this, consult your healthcare provider or pharmacist prior to using any new prescription or non-prescription medications, including herbals, vitamins, non-steroidal anti-inflammatory drugs (NSAIDs) and supplements. ° °This website has more information on Xarelto®: www.xarelto.com. ° ° ° °

## 2018-05-05 NOTE — Interval H&P Note (Signed)
History and Physical Interval Note:  05/05/2018 9:56 AM  Martha Richard  has presented today for surgery, with the diagnosis of right hip osteoarthritis.  The various methods of treatment have been discussed with the patient and family. After consideration of risks, benefits and other options for treatment, the patient has consented to  Procedure(s): TOTAL HIP ARTHROPLASTY ANTERIOR APPROACH (Right) as a surgical intervention.  The patient's history has been reviewed, patient examined, no change in status, stable for surgery.  I have reviewed the patient's chart and labs.  Questions were answered to the patient's satisfaction.     Pilar Plate Pranika Finks

## 2018-05-05 NOTE — Anesthesia Postprocedure Evaluation (Signed)
Anesthesia Post Note  Patient: Martha Richard  Procedure(s) Performed: TOTAL HIP ARTHROPLASTY ANTERIOR APPROACH (Right Hip)     Patient location during evaluation: PACU Anesthesia Type: General Level of consciousness: awake and alert, awake and oriented Pain management: pain level controlled Vital Signs Assessment: post-procedure vital signs reviewed and stable Respiratory status: spontaneous breathing, nonlabored ventilation and respiratory function stable Cardiovascular status: blood pressure returned to baseline and stable Postop Assessment: no apparent nausea or vomiting Anesthetic complications: no    Last Vitals:  Vitals:   05/05/18 1611 05/05/18 1711  BP: (!) 134/57 (!) 137/59  Pulse: 78 69  Resp: 16 16  Temp: (!) 36.4 C 36.9 C  SpO2: 100% 100%    Last Pain:  Vitals:   05/05/18 1711  TempSrc: Oral  PainSc:                  Catalina Gravel

## 2018-05-06 LAB — CBC
HCT: 33.2 % — ABNORMAL LOW (ref 36.0–46.0)
Hemoglobin: 10 g/dL — ABNORMAL LOW (ref 12.0–15.0)
MCH: 29.2 pg (ref 26.0–34.0)
MCHC: 30.1 g/dL (ref 30.0–36.0)
MCV: 96.8 fL (ref 80.0–100.0)
Platelets: 203 10*3/uL (ref 150–400)
RBC: 3.43 MIL/uL — ABNORMAL LOW (ref 3.87–5.11)
RDW: 13.7 % (ref 11.5–15.5)
WBC: 14.3 10*3/uL — ABNORMAL HIGH (ref 4.0–10.5)
nRBC: 0 % (ref 0.0–0.2)

## 2018-05-06 LAB — BASIC METABOLIC PANEL
Anion gap: 4 — ABNORMAL LOW (ref 5–15)
BUN: 18 mg/dL (ref 8–23)
CO2: 25 mmol/L (ref 22–32)
Calcium: 8.3 mg/dL — ABNORMAL LOW (ref 8.9–10.3)
Chloride: 110 mmol/L (ref 98–111)
Creatinine, Ser: 0.64 mg/dL (ref 0.44–1.00)
GFR calc Af Amer: >60 mL/min (ref >60)
GFR calc non Af Amer: >60 mL/min (ref >60)
Glucose, Bld: 120 mg/dL — ABNORMAL HIGH (ref 70–99)
Potassium: 4.5 mmol/L (ref 3.5–5.1)
Sodium: 139 mmol/L (ref 135–145)

## 2018-05-06 NOTE — Progress Notes (Signed)
   Subjective: 1 Day Post-Op Procedure(s) (LRB): TOTAL HIP ARTHROPLASTY ANTERIOR APPROACH (Right) Patient reports pain as moderate.   Patient seen in rounds by Dr. Wynelle Link. Patient is well, and has had no acute complaints or problems other than pain in the right hip. Foley catheter removed this AM. Denies chest pain or SOB. We will start therapy today.   Objective: Vital signs in last 24 hours: Temp:  [97.5 F (36.4 C)-98.6 F (37 C)] 98.3 F (36.8 C) (03/12 0557) Pulse Rate:  [63-98] 98 (03/12 0557) Resp:  [6-18] 17 (03/12 0557) BP: (110-214)/(57-106) 110/73 (03/12 0557) SpO2:  [98 %-100 %] 98 % (03/12 0557) Weight:  [71 kg-71.8 kg] 71 kg (03/11 1500)  Intake/Output from previous day:  Intake/Output Summary (Last 24 hours) at 05/06/2018 0741 Last data filed at 05/06/2018 0640 Gross per 24 hour  Intake 3241.25 ml  Output 1615 ml  Net 1626.25 ml   Labs: Recent Labs    05/06/18 0514  HGB 10.0*   Recent Labs    05/06/18 0514  WBC 14.3*  RBC 3.43*  HCT 33.2*  PLT 203   Recent Labs    05/06/18 0514  NA 139  K 4.5  CL 110  CO2 25  BUN 18  CREATININE 0.64  GLUCOSE 120*  CALCIUM 8.3*   Exam: General - Patient is Alert and Oriented Extremity - Neurologically intact Neurovascular intact Sensation intact distally Dorsiflexion/Plantar flexion intact Dressing - dressing C/D/I Motor Function - intact, moving foot and toes well on exam.   Past Medical History:  Diagnosis Date  . Arrhythmia    palpitations  . Arthritis   . Cancer (HCC)    squamous cell Left hand  . Complication of anesthesia    slow to wake up  . Headache    hx of migraines  . Hypertension     Assessment/Plan: 1 Day Post-Op Procedure(s) (LRB): TOTAL HIP ARTHROPLASTY ANTERIOR APPROACH (Right) Active Problems:   OA (osteoarthritis) of hip  Estimated body mass index is 28.63 kg/m as calculated from the following:   Height as of this encounter: 5\' 2"  (1.575 m).   Weight as of this  encounter: 71 kg. Advance diet Up with therapy  DVT Prophylaxis - Xarelto Weight bearing as tolerated. D/C O2 and pulse ox and try on room air. Hemovac pulled without difficulty, will begin therapy.  Plan is to go Home after hospital stay. Plan for discharge tomorrow pending progress with therapy.  Theresa Duty, PA-C Orthopedic Surgery 05/06/2018, 7:41 AM

## 2018-05-06 NOTE — Progress Notes (Signed)
Physical Therapy Treatment Patient Details Name: Martha Richard MRN: [id number] DOB: [birth year] Today's Date: 05/06/2018    History of Present Illness s/p R DA THA; Hx: L DA THA    PT Comments    Pt fatigues easily, requiring incr assist overall this afternoon. Pt concerned about possible d/c tomorrow, discussed with pt we will see how she does next day.   Follow Up Recommendations  Follow surgeon's recommendation for DC plan and follow-up therapies     Equipment Recommendations  None recommended by PT    Recommendations for Other Services       Precautions / Restrictions Precautions Precautions: Fall Restrictions Weight Bearing Restrictions: No Other Position/Activity Restrictions: WBAT    Mobility  Bed Mobility Overal bed mobility: Needs Assistance Bed Mobility: Sit to Supine       Sit to supine: Mod assist   General bed mobility comments: assist with LEs into bed   Transfers Overall transfer level: Needs assistance Equipment used: Rolling walker (2 wheeled) Transfers: Sit to/from Stand Sit to Stand: Min assist;Mod assist         General transfer comment: cues for hand placement and RLE position  Ambulation/Gait Ambulation/Gait assistance: Mod assist Gait Distance (Feet): 6 Feet Assistive device: Rolling walker (2 wheeled) Gait Pattern/deviations: Step-to pattern;Decreased weight shift to right     General Gait Details: cues for sequence, posture, safety   Stairs             Wheelchair Mobility    Modified Rankin (Stroke Patients Only)       Balance                                            Cognition Arousal/Alertness: Awake/alert Behavior During Therapy: WFL for tasks assessed/performed Overall Cognitive Status: Within Functional Limits for tasks assessed                                        Exercises Total Joint Exercises Ankle Circles/Pumps: AROM;10 reps;Both Quad Sets: AROM;Both;10  reps Short Arc Quad: Right;10 reps;AROM Heel Slides: AAROM;Right;15 reps Hip ABduction/ADduction: AAROM;Right;10 reps    General Comments        Pertinent Vitals/Pain Pain Assessment: 0-10 Pain Score: 5  Pain Location: right hip Pain Descriptors / Indicators: Sore Pain Intervention(s): Monitored during session;Limited activity within patient's tolerance    Home Living Family/patient expects to be discharged to:: Private residence Living Arrangements: Spouse/significant other Available Help at Discharge: Family Type of Home: House Home Access: Stairs to enter   Home Layout: Able to live on main level with bedroom/bathroom Home Equipment: Walker - 2 wheels      Prior Function Level of Independence: Independent;Independent with assistive device(s)          PT Goals (current goals can now be found in the care plan section) Acute Rehab PT Goals Patient Stated Goal: less pain PT Goal Formulation: With patient Time For Goal Achievement: 05/13/18 Potential to Achieve Goals: Good Progress towards PT goals: Progressing toward goals    Frequency    7X/week      PT Plan Current plan remains appropriate    Co-evaluation              AM-PAC PT "6 Clicks" Mobility   Outcome Measure  Help needed turning  from your back to your side while in a flat bed without using bedrails?: A Little Help needed moving from lying on your back to sitting on the side of a flat bed without using bedrails?: A Little Help needed moving to and from a bed to a chair (including a wheelchair)?: A Little Help needed standing up from a chair using your arms (e.g., wheelchair or bedside chair)?: A Little Help needed to walk in hospital room?: A Little Help needed climbing 3-5 steps with a railing? : A Lot 6 Click Score: 17    End of Session Equipment Utilized During Treatment: Gait belt Activity Tolerance: Patient tolerated treatment well Patient left: with call bell/phone within reach;in  bed;with bed alarm set   PT Visit Diagnosis: Difficulty in walking, not elsewhere classified (R26.2)     Time: 1660-6004 PT Time Calculation (min) (ACUTE ONLY): 14 min  Charges:  $Therapeutic Exercise: 8-22 mins                     Kenyon Ana, PT  Pager: [phone] Acute Rehab Dept [facility]): [phone]   05/06/2018    [facility] 05/06/2018, 3:13 PM

## 2018-05-06 NOTE — Evaluation (Signed)
Physical Therapy Evaluation Patient Details Name: Martha Richard MRN: [id number] DOB: 07-15-1929 Today's Date: 05/06/2018   History of Present Illness  s/p R DA THA; Hx: L DA THA  Clinical Impression  Pt is s/p THA resulting in the deficits listed below (see PT Problem List).  Pt will benefit from skilled PT to increase their independence and safety with mobility to allow discharge to the venue listed below.      Follow Up Recommendations Follow surgeon's recommendation for DC plan and follow-up therapies    Equipment Recommendations  None recommended by PT    Recommendations for Other Services       Precautions / Restrictions Precautions Precautions: Fall Restrictions Weight Bearing Restrictions: No Other Position/Activity Restrictions: WBAT      Mobility  Bed Mobility               General bed mobility comments: pt in recliner  Transfers Overall transfer level: Needs assistance Equipment used: Rolling walker (2 wheeled) Transfers: Sit to/from Stand Sit to Stand: Min assist         General transfer comment: cues for hand placement and RLE position  Ambulation/Gait Ambulation/Gait assistance: Min assist Gait Distance (Feet): 35 Feet Assistive device: Rolling walker (2 wheeled) Gait Pattern/deviations: Step-to pattern;Decreased weight shift to right     General Gait Details: cues for sequence   Stairs            Wheelchair Mobility    Modified Rankin (Stroke Patients Only)       Balance                                             Pertinent Vitals/Pain Pain Assessment: 0-10 Pain Score: 4  Pain Location: right hip Pain Descriptors / Indicators: Sore Pain Intervention(s): Limited activity within patient's tolerance;Monitored during session;Repositioned    Home Living Family/patient expects to be discharged to:: Private residence Living Arrangements: Spouse/significant other Available Help at Discharge:  Family Type of Home: House Home Access: Stairs to enter   Technical brewer of Steps: 1 Home Layout: Able to live on main level with bedroom/bathroom Home Equipment: Walker - 2 wheels      Prior Function Level of Independence: Independent;Independent with assistive device(s)               Hand Dominance        Extremity/Trunk Assessment   Upper Extremity Assessment Upper Extremity Assessment: Overall WFL for tasks assessed    Lower Extremity Assessment Lower Extremity Assessment: RLE deficits/detail RLE Deficits / Details: ankle WFL; hip and knee grossly 3/5 limited by post op pain and weakness       Communication   Communication: No difficulties  Cognition Arousal/Alertness: Awake/alert Behavior During Therapy: WFL for tasks assessed/performed Overall Cognitive Status: Within Functional Limits for tasks assessed                                        General Comments      Exercises     Assessment/Plan    PT Assessment Patient needs continued PT services  PT Problem List Decreased strength;Decreased activity tolerance;Decreased mobility;Decreased knowledge of use of DME       PT Treatment Interventions DME instruction;Gait training;Functional mobility training;Therapeutic exercise;Patient/family education;Therapeutic activities    PT Goals (  Current goals can be found in the Care Plan section)  Acute Rehab PT Goals Patient Stated Goal: less pain PT Goal Formulation: With patient Time For Goal Achievement: 05/13/18 Potential to Achieve Goals: Good    Frequency 7X/week   Barriers to discharge        Co-evaluation               AM-PAC PT "6 Clicks" Mobility  Outcome Measure Help needed turning from your back to your side while in a flat bed without using bedrails?: A Little Help needed moving from lying on your back to sitting on the side of a flat bed without using bedrails?: A Little Help needed moving to and from a  bed to a chair (including a wheelchair)?: A Little Help needed standing up from a chair using your arms (e.g., wheelchair or bedside chair)?: A Little Help needed to walk in hospital room?: A Little Help needed climbing 3-5 steps with a railing? : A Lot 6 Click Score: 17    End of Session Equipment Utilized During Treatment: Gait belt Activity Tolerance: Patient tolerated treatment well Patient left: in chair;with call bell/phone within reach;with chair alarm set   PT Visit Diagnosis: Difficulty in walking, not elsewhere classified (R26.2)    Time: 8891-6945 PT Time Calculation (min) (ACUTE ONLY): 12 min   Charges:   PT Evaluation $PT Eval Low Complexity: 1 Low          Kenyon Ana, PT  Pager: [phone] Acute Rehab Dept [facility]): [phone]   05/06/2018   [facility] 05/06/2018, 1:41 PM

## 2018-05-07 ENCOUNTER — Encounter (HOSPITAL_COMMUNITY): Payer: Self-pay | Admitting: Orthopedic Surgery

## 2018-05-07 LAB — BASIC METABOLIC PANEL
Anion gap: 7 (ref 5–15)
BUN: 17 mg/dL (ref 8–23)
CO2: 25 mmol/L (ref 22–32)
Calcium: 8.5 mg/dL — ABNORMAL LOW (ref 8.9–10.3)
Chloride: 107 mmol/L (ref 98–111)
Creatinine, Ser: 0.64 mg/dL (ref 0.44–1.00)
GFR calc Af Amer: >60 mL/min (ref >60)
GFR calc non Af Amer: >60 mL/min (ref >60)
Glucose, Bld: 114 mg/dL — ABNORMAL HIGH (ref 70–99)
Potassium: 4 mmol/L (ref 3.5–5.1)
Sodium: 139 mmol/L (ref 135–145)

## 2018-05-07 LAB — CBC
HCT: 31.7 % — ABNORMAL LOW (ref 36.0–46.0)
Hemoglobin: 9.6 g/dL — ABNORMAL LOW (ref 12.0–15.0)
MCH: 29.2 pg (ref 26.0–34.0)
MCHC: 30.3 g/dL (ref 30.0–36.0)
MCV: 96.4 fL (ref 80.0–100.0)
Platelets: 192 10*3/uL (ref 150–400)
RBC: 3.29 MIL/uL — ABNORMAL LOW (ref 3.87–5.11)
RDW: 13.8 % (ref 11.5–15.5)
WBC: 13.1 10*3/uL — ABNORMAL HIGH (ref 4.0–10.5)
nRBC: 0 % (ref 0.0–0.2)

## 2018-05-07 MED ORDER — HYDROCODONE-ACETAMINOPHEN 5-325 MG PO TABS: 1.0000 | ORAL_TABLET | Freq: Four times a day (QID) | ORAL | 0 refills | Status: DC | PRN

## 2018-05-07 MED ORDER — RIVAROXABAN 10 MG PO TABS: 10.0000 mg | ORAL_TABLET | Freq: Every day | ORAL | 0 refills | Status: DC

## 2018-05-07 MED ORDER — METHOCARBAMOL 500 MG PO TABS: 500.0000 mg | ORAL_TABLET | Freq: Four times a day (QID) | ORAL | 0 refills | Status: DC | PRN

## 2018-05-07 NOTE — Progress Notes (Signed)
Physical Therapy Treatment Patient Details Name: Martha Richard MRN: [id number] DOB: Sep 11, 1929 Today's Date: 05/07/2018    History of Present Illness s/p R DA THA; Hx: L DA THA    PT Comments    Pt progressing, incr gait distance today; will see for second session for stair training and then should be ready for d/c   Follow Up Recommendations  Follow surgeon's recommendation for DC plan and follow-up therapies     Equipment Recommendations  None recommended by PT    Recommendations for Other Services       Precautions / Restrictions Precautions Precautions: Fall Restrictions Weight Bearing Restrictions: No Other Position/Activity Restrictions: WBAT    Mobility  Bed Mobility               General bed mobility comments: in chair  Transfers Overall transfer level: Needs assistance Equipment used: Rolling walker (2 wheeled) Transfers: Sit to/from Stand Sit to Stand: Min guard         General transfer comment: cues for hand placement and RLE position  Ambulation/Gait Ambulation/Gait assistance: Min guard Gait Distance (Feet): 100 Feet Assistive device: Rolling walker (2 wheeled) Gait Pattern/deviations: Step-to pattern;Decreased weight shift to right;Decreased stance time - right     General Gait Details: cues for sequence, posture, safety   Stairs             Wheelchair Mobility    Modified Rankin (Stroke Patients Only)       Balance                                            Cognition Arousal/Alertness: Awake/alert Behavior During Therapy: WFL for tasks assessed/performed Overall Cognitive Status: Within Functional Limits for tasks assessed                                        Exercises Total Joint Exercises Ankle Circles/Pumps: AROM;10 reps;Both Quad Sets: AROM;Both;10 reps Short Arc Quad: Right;10 reps;AROM Heel Slides: AAROM;Right;15 reps Hip ABduction/ADduction: AAROM;Right;10 reps    General Comments        Pertinent Vitals/Pain Pain Assessment: 0-10 Pain Score: 6  Pain Location: right hip Pain Descriptors / Indicators: Sore Pain Intervention(s): Limited activity within patient's tolerance;Monitored during session;Premedicated before session    Home Living                      Prior Function            PT Goals (current goals can now be found in the care plan section) Acute Rehab PT Goals Patient Stated Goal: less pain PT Goal Formulation: With patient Time For Goal Achievement: 05/13/18 Potential to Achieve Goals: Good Progress towards PT goals: Progressing toward goals    Frequency    7X/week      PT Plan Current plan remains appropriate    Co-evaluation              AM-PAC PT "6 Clicks" Mobility   Outcome Measure  Help needed turning from your back to your side while in a flat bed without using bedrails?: A Little Help needed moving from lying on your back to sitting on the side of a flat bed without using bedrails?: A Little Help needed moving to and from a bed to  a chair (including a wheelchair)?: A Little Help needed standing up from a chair using your arms (e.g., wheelchair or bedside chair)?: A Little Help needed to walk in hospital room?: A Little Help needed climbing 3-5 steps with a railing? : A Lot 6 Click Score: 17    End of Session Equipment Utilized During Treatment: Gait belt Activity Tolerance: Patient tolerated treatment well Patient left: with call bell/phone within reach;in bed;with bed alarm set   PT Visit Diagnosis: Difficulty in walking, not elsewhere classified (R26.2)     Time: 1610-9604 PT Time Calculation (min) (ACUTE ONLY): 14 min  Charges:  $Gait Training: 8-22 mins                     Kenyon Ana, PT  Pager: [phone] Acute Rehab Dept [facility]): [phone]   05/07/2018    [facility] 05/07/2018, 11:44 AM

## 2018-05-07 NOTE — Progress Notes (Addendum)
   Subjective: 2 Days Post-Op Procedure(s) (LRB): TOTAL HIP ARTHROPLASTY ANTERIOR APPROACH (Right) Patient reports pain as mild.   Patient seen in rounds by Dr. Wynelle Link. Patient is well, and has had no acute complaints or problems other than pain in the right hip. Denies chest pain or SOB. Voiding without difficulty. Positive flatus.  Plan is to go Home after hospital stay.  Objective: Vital signs in last 24 hours: Temp:  [97.8 F (36.6 C)-99.3 F (37.4 C)] 98.2 F (36.8 C) (03/13 0516) Pulse Rate:  [73-83] 74 (03/13 0516) Resp:  [14-16] 14 (03/13 0516) BP: (132-176)/(52-76) 168/69 (03/13 0516) SpO2:  [96 %-100 %] 97 % (03/13 0516)  Intake/Output from previous day:  Intake/Output Summary (Last 24 hours) at 05/07/2018 0727 Last data filed at 05/07/2018 0345 Gross per 24 hour  Intake 720 ml  Output 1050 ml  Net -330 ml    Labs: Recent Labs    05/06/18 0514 05/07/18 0508  HGB 10.0* 9.6*   Recent Labs    05/06/18 0514 05/07/18 0508  WBC 14.3* 13.1*  RBC 3.43* 3.29*  HCT 33.2* 31.7*  PLT 203 192   Recent Labs    05/06/18 0514 05/07/18 0508  NA 139 139  K 4.5 4.0  CL 110 107  CO2 25 25  BUN 18 17  CREATININE 0.64 0.64  GLUCOSE 120* 114*  CALCIUM 8.3* 8.5*   Exam: General - Patient is Alert and Oriented Extremity - Neurologically intact Neurovascular intact Sensation intact distally Dorsiflexion/Plantar flexion intact Dressing/Incision - clean, dry, no drainage Motor Function - intact, moving foot and toes well on exam.   Past Medical History:  Diagnosis Date  . Arrhythmia    palpitations  . Arthritis   . Cancer (HCC)    squamous cell Left hand  . Complication of anesthesia    slow to wake up  . Headache    hx of migraines  . Hypertension     Assessment/Plan: 2 Days Post-Op Procedure(s) (LRB): TOTAL HIP ARTHROPLASTY ANTERIOR APPROACH (Right) Active Problems:   OA (osteoarthritis) of hip  Estimated body mass index is 28.63 kg/m as  calculated from the following:   Height as of this encounter: 5\' 2"  (1.575 m).   Weight as of this encounter: 71 kg. Up with therapy  DVT Prophylaxis - Xarelto Weight-bearing as tolerated  Discontinuing IV pain medication, discussed minimizing use of narcotics as tolerated to improve fatigue.  Possible discharge this afternoon with HHPT if progresses with therapy and meeting her goals. Follow-up in the office in 2 weeks.   Theresa Duty, PA-C Orthopedic Surgery 05/07/2018, 7:27 AM

## 2018-05-07 NOTE — TOC Transition Note (Signed)
Transition of Care [facility]) - CM/SW Discharge Note   Patient Details  Name: NITIKA JACKOWSKI MRN: [id number] Date of Birth: 1929/03/24  Transition of Care [facility]) CM/SW Contact:  Leeroy Cha, RN Phone Number: 05/07/2018, 10:41 AM   Clinical Narrative:    Discharged to go home on HEP and with equipment.   Final next level of care: Home/Self Care Barriers to Discharge: No Barriers Identified   Patient Goals and CMS Choice Patient states their goals for this hospitalization and ongoing recovery are:: just to go home and do the therapy and start walking      Discharge Placement  home                     Discharge Plan and Services   Post Acute Care Choice: Durable Medical Equipment          DME Arranged: 3-N-1, Walker rolling DME Agency: [place]       Social Determinants of Health (SDOH) Interventions     Readmission Risk Interventions No flowsheet data found.

## 2018-05-07 NOTE — Progress Notes (Signed)
   05/07/18 1400  PT Visit Information  Last PT Received On 05/07/18  Pt progressing well; pt will benefit from HHPT, supervision for mobility at home; ready fo rd/c from PT standpoint  Assistance Needed +1  History of Present Illness s/p R DA THA; Hx: L DA THA  Subjective Data  Patient Stated Goal less pain  Precautions  Precautions Fall  Restrictions  Other Position/Activity Restrictions WBAT  Pain Assessment  Pain Assessment 0-10  Pain Score 5  Pain Location right hip  Pain Descriptors / Indicators Sore  Pain Intervention(s) Limited activity within patient's tolerance;Monitored during session  Cognition  Arousal/Alertness Awake/alert  Behavior During Therapy WFL for tasks assessed/performed  Overall Cognitive Status Within Functional Limits for tasks assessed  Bed Mobility  General bed mobility comments in chair  Transfers  Overall transfer level Needs assistance  Equipment used Rolling walker (2 wheeled)  Transfers Sit to/from Stand  Sit to Stand Min guard  General transfer comment cues for hand placement and RLE position  Ambulation/Gait  Ambulation/Gait assistance Min guard  Gait Distance (Feet) 30 Feet  Assistive device Rolling walker (2 wheeled)  Gait Pattern/deviations Step-to pattern;Decreased weight shift to right;Decreased stance time - right  General Gait Details cues for sequence, posture, safety  Stairs Yes  Stairs assistance Min assist;Min guard  Stair Management No rails;Step to pattern;Backwards;With walker  Number of Stairs 1  General stair comments cues for safe technique and sequence  PT - End of Session  Equipment Utilized During Treatment Gait belt  Activity Tolerance Patient tolerated treatment well  Patient left in chair;with call bell/phone within reach;with family/visitor present   PT - Assessment/Plan  PT Plan Current plan remains appropriate  PT Visit Diagnosis Difficulty in walking, not elsewhere classified (R26.2)  PT Frequency (ACUTE  ONLY) 7X/week  Follow Up Recommendations Follow surgeon's recommendation for DC plan and follow-up therapies  PT equipment None recommended by PT  AM-PAC PT "6 Clicks" Mobility Outcome Measure (Version 2)  Help needed turning from your back to your side while in a flat bed without using bedrails? 3  Help needed moving from lying on your back to sitting on the side of a flat bed without using bedrails? 3  Help needed moving to and from a bed to a chair (including a wheelchair)? 3  Help needed standing up from a chair using your arms (e.g., wheelchair or bedside chair)? 3  Help needed to walk in hospital room? 3  Help needed climbing 3-5 steps with a railing?  3  6 Click Score 18  Consider Recommendation of Discharge To: [facility] with [facility]  PT Goal Progression  Progress towards PT goals Progressing toward goals  Acute Rehab PT Goals  PT Goal Formulation With patient  Time For Goal Achievement 05/13/18  Potential to Achieve Goals Good  PT Time Calculation  PT Start Time (ACUTE ONLY) 1345  PT Stop Time (ACUTE ONLY) 1402  PT Time Calculation (min) (ACUTE ONLY) 17 min  PT General Charges  $$ ACUTE PT VISIT 1 Visit  PT Treatments  $Gait Training 8-22 mins

## 2018-05-09 LAB — TYPE AND SCREEN
ABO/RH(D): O POS
Antibody Screen: POSITIVE
Unit division: 0
Unit division: 0

## 2018-05-09 LAB — BPAM RBC
Blood Product Expiration Date: 202003302359
Blood Product Expiration Date: 202003312359
ISSUE DATE / TIME: 202003041231
Unit Type and Rh: 5100
Unit Type and Rh: 5100

## 2018-05-12 NOTE — Discharge Summary (Signed)
Physician Discharge Summary   Patient ID: Martha Richard MRN: [id number] DOB/AGE: 28-Sep-1929 85 y.o.  Admit date: 05/05/2018 Discharge date: 05/07/2018  Primary Diagnosis: Osteoarthritis, right hip   Admission Diagnoses:  Past Medical History:  Diagnosis Date   Arrhythmia    palpitations   Arthritis    Cancer ([place])    squamous cell Left hand   Complication of anesthesia    slow to wake up   Headache    hx of migraines   Hypertension    Discharge Diagnoses:   Active Problems:   OA (osteoarthritis) of hip  Estimated body mass index is 28.63 kg/m as calculated from the following:   Height as of this encounter: 5\' 2"  (1.575 m).   Weight as of this encounter: 71 kg.  Procedure:  Procedure(s) (LRB): TOTAL HIP ARTHROPLASTY ANTERIOR APPROACH (Right)   Consults: None  HPI: Martha Richard is a 85 y.o. female who has advanced end-stage arthritis of their Right  hip with progressively worsening pain and dysfunction.The patient has failed nonoperative management and presents for total hip arthroplasty.   Laboratory Data: Admission on 05/05/2018, Discharged on 05/07/2018  Component Date Value Ref Range Status   ABO/RH(D) 05/05/2018 O POS   Final   Antibody Screen 05/05/2018 POS   Final   Sample Expiration 05/05/2018 05/08/2018   Final   Unit Number 05/05/2018 [id number]   Final   Blood Component Type 05/05/2018 RED CELLS,LR   Final   Unit division 05/05/2018 00   Final   Status of Unit 05/05/2018 REL FROM [facility]   Final   Transfusion Status 05/05/2018 OK TO TRANSFUSE   Final   Crossmatch Result 05/05/2018 COMPATIBLE   Final   Unit Number 05/05/2018 [id number]   Final   Blood Component Type 05/05/2018 RED CELLS,LR   Final   Unit division 05/05/2018 00   Final   Status of Unit 05/05/2018 REL FROM [facility]   Final   Transfusion Status 05/05/2018 OK TO TRANSFUSE   Final   Crossmatch Result 05/05/2018 COMPATIBLE   Final   Blood Product Unit Number  05/05/2018 [id number]   Final   Unit Type and Rh 05/05/2018 5100   Final   Blood Product Expiration Date 05/05/2018 [id number]   Final   ISSUE DATE / TIME 05/05/2018 [id number]   Final   Blood Product Unit Number 05/05/2018 [id number]   Final   PRODUCT CODE 05/05/2018 [id number]   Final   Unit Type and Rh 05/05/2018 5100   Final   Blood Product Expiration Date 05/05/2018 [id number]   Final   WBC 05/06/2018 14.3* 4.0 - 10.5 K/uL Final   RBC 05/06/2018 3.43* 3.87 - 5.11 MIL/uL Final   Hemoglobin 05/06/2018 10.0* 12.0 - 15.0 g/dL Final   HCT 05/06/2018 33.2* 36.0 - 46.0 % Final   MCV 05/06/2018 96.8  80.0 - 100.0 fL Final   MCH 05/06/2018 29.2  26.0 - 34.0 pg Final   MCHC 05/06/2018 30.1  30.0 - 36.0 g/dL Final   RDW 05/06/2018 13.7  11.5 - 15.5 % Final   Platelets 05/06/2018 203  150 - 400 K/uL Final   nRBC 05/06/2018 0.0  0.0 - 0.2 % Final   Performed at [facility], [place] [address] [place], [place] [address]   Sodium 05/06/2018 139  135 - 145 mmol/L Final   Potassium 05/06/2018 4.5  3.5 - 5.1 mmol/L Final   Chloride 05/06/2018 110  98 - 111 mmol/L Final   CO2 05/06/2018 25  22 -  32 mmol/L Final   Glucose, Bld 05/06/2018 120* 70 - 99 mg/dL Final   BUN 05/06/2018 18  8 - 23 mg/dL Final   Creatinine, Ser 05/06/2018 0.64  0.44 - 1.00 mg/dL Final   Calcium 05/06/2018 8.3* 8.9 - 10.3 mg/dL Final   GFR calc non Af Amer 05/06/2018 >60  >60 mL/min Final   GFR calc Af Amer 05/06/2018 >60  >60 mL/min Final   Anion gap 05/06/2018 4* 5 - 15 Final   Performed at [facility], [place] [address], [place], [place] [address]   WBC 05/07/2018 13.1* 4.0 - 10.5 K/uL Final   RBC 05/07/2018 3.29* 3.87 - 5.11 MIL/uL Final   Hemoglobin 05/07/2018 9.6* 12.0 - 15.0 g/dL Final   HCT 05/07/2018 31.7* 36.0 - 46.0 % Final   MCV 05/07/2018 96.4  80.0 - 100.0 fL Final   MCH 05/07/2018 29.2  26.0 - 34.0 pg Final   MCHC 05/07/2018  30.3  30.0 - 36.0 g/dL Final   RDW 05/07/2018 13.8  11.5 - 15.5 % Final   Platelets 05/07/2018 192  150 - 400 K/uL Final   nRBC 05/07/2018 0.0  0.0 - 0.2 % Final   Performed at [facility], [place] [address] [place], [place] [address]   Sodium 05/07/2018 139  135 - 145 mmol/L Final   Potassium 05/07/2018 4.0  3.5 - 5.1 mmol/L Final   Chloride 05/07/2018 107  98 - 111 mmol/L Final   CO2 05/07/2018 25  22 - 32 mmol/L Final   Glucose, Bld 05/07/2018 114* 70 - 99 mg/dL Final   BUN 05/07/2018 17  8 - 23 mg/dL Final   Creatinine, Ser 05/07/2018 0.64  0.44 - 1.00 mg/dL Final   Calcium 05/07/2018 8.5* 8.9 - 10.3 mg/dL Final   GFR calc non Af Amer 05/07/2018 >60  >60 mL/min Final   GFR calc Af Amer 05/07/2018 >60  >60 mL/min Final   Anion gap 05/07/2018 7  5 - 15 Final   Performed at [facility], [place] [address] [place], [place] [address]  Hospital Outpatient Visit on 04/30/2018  Component Date Value Ref Range Status   MRSA, PCR 04/30/2018 NEGATIVE  NEGATIVE Final   Staphylococcus aureus 04/30/2018 NEGATIVE  NEGATIVE Final   Comment: (NOTE) The Xpert SA Assay (FDA approved for NASAL specimens in patients 24 years of age and older), is one component of a comprehensive surveillance program. It is not intended to diagnose infection nor to guide or monitor treatment. Performed at [facility], [place] [address] [place], [place] [address]    aPTT 04/30/2018 28  24 - 36 seconds Final   Performed at [facility], [place] [address], [place], [place] [address]   WBC 04/30/2018 8.6  4.0 - 10.5 K/uL Final   RBC 04/30/2018 4.47  3.87 - 5.11 MIL/uL Final   Hemoglobin 04/30/2018 13.1  12.0 - 15.0 g/dL Final   HCT 04/30/2018 42.8  36.0 - 46.0 % Final   MCV 04/30/2018 95.7  80.0 - 100.0 fL Final   MCH 04/30/2018 29.3  26.0 - 34.0 pg Final   MCHC 04/30/2018 30.6  30.0 - 36.0 g/dL Final   RDW 04/30/2018 13.4   11.5 - 15.5 % Final   Platelets 04/30/2018 281  150 - 400 K/uL Final   nRBC 04/30/2018 0.0  0.0 - 0.2 % Final   Performed at [facility], [place] [address], [place], [place] [address]   Sodium  04/30/2018 138  135 - 145 mmol/L Final   Potassium 04/30/2018 4.5  3.5 - 5.1 mmol/L Final   Chloride 04/30/2018 104  98 - 111 mmol/L Final   CO2 04/30/2018 28  22 - 32 mmol/L Final   Glucose, Bld 04/30/2018 118* 70 - 99 mg/dL Final   BUN 04/30/2018 25* 8 - 23 mg/dL Final   Creatinine, Ser 04/30/2018 0.84  0.44 - 1.00 mg/dL Final   Calcium 04/30/2018 9.4  8.9 - 10.3 mg/dL Final   Total Protein 04/30/2018 6.5  6.5 - 8.1 g/dL Final   Albumin 04/30/2018 3.6  3.5 - 5.0 g/dL Final   AST 04/30/2018 19  15 - 41 U/L Final   ALT 04/30/2018 14  0 - 44 U/L Final   Alkaline Phosphatase 04/30/2018 79  38 - 126 U/L Final   Total Bilirubin 04/30/2018 0.8  0.3 - 1.2 mg/dL Final   GFR calc non Af Amer 04/30/2018 >60  >60 mL/min Final   GFR calc Af Amer 04/30/2018 >60  >60 mL/min Final   Anion gap 04/30/2018 6  5 - 15 Final   Performed at [facility], [place] [address] [place], [place] [address]   Prothrombin Time 04/30/2018 12.6  11.4 - 15.2 seconds Final   INR 04/30/2018 1.0  0.8 - 1.2 Final   Comment: (NOTE) INR goal varies based on device and disease states. Performed at [facility], [place] [address], [place], [place] [address]    ABO/RH(D) 04/30/2018 O POS   Final   Antibody Screen 04/30/2018 POS   Final   Sample Expiration 04/30/2018 05/04/2018   Final   Extend sample reason 04/30/2018 NO TRANSFUSIONS OR PREGNANCY IN THE PAST 3 MONTHS   Final   Antibody Identification 75/64/3329 NO CLINICALLY SIGNIFICANT ANTIBODY IDENTIFIED   Final   Unit Number 04/30/2018 [id number]   Final   Blood Component Type 04/30/2018 RED CELLS,LR   Final   Unit division 04/30/2018 00   Final   Status of Unit 04/30/2018 REL FROM [facility]   Final    Transfusion Status 04/30/2018 OK TO TRANSFUSE   Final   Crossmatch Result 04/30/2018 COMPATIBLE   Final   Unit Number 04/30/2018 [id number]   Final   Blood Component Type 04/30/2018 RED CELLS,LR   Final   Unit division 04/30/2018 00   Final   Status of Unit 04/30/2018 REL FROM [facility]   Final   Transfusion Status 04/30/2018 OK TO TRANSFUSE   Final   Crossmatch Result 04/30/2018 COMPATIBLE   Final   Blood Product Unit Number 04/30/2018 [id number]   Final   Unit Type and Rh 04/30/2018 5100   Final   Blood Product Expiration Date 04/30/2018 [id number]   Final   ISSUE DATE / TIME 04/30/2018 [id number]   Final   Blood Product Unit Number 04/30/2018 [id number]   Final   PRODUCT CODE 04/30/2018 [id number]   Final   Unit Type and Rh 04/30/2018 5100   Final   Blood Product Expiration Date 04/30/2018 [id number]   Final     X-Rays:Dg Pelvis Portable  Result Date: 05/05/2018 CLINICAL DATA:  Status post right hip replacement EXAM: PORTABLE PELVIS 1-2 VIEWS COMPARISON:  None. FINDINGS: Bilateral total hip arthroplasties. No periprosthetic abnormality. Normal alignment. There is a drain over the right hip with expected adjacent postoperative soft tissue gas. IMPRESSION: Expected postoperative appearance of right total hip arthroplasty. Electronically Signed   By: Ulyses Jarred M.D.   On: 05/05/2018 15:23   Dg C-arm  1-60 Min-no Report  Result Date: 05/05/2018 Fluoroscopy was utilized by the requesting physician.  No radiographic interpretation.   Dg Hip Operative Unilat W Or W/o Pelvis Right  Result Date: 05/05/2018 CLINICAL DATA:  RIGHT hip arthroplasty EXAM: OPERATIVE RIGHT HIP (WITH PELVIS IF PERFORMED) 2 VIEWS TECHNIQUE: Fluoroscopic spot image(s) were submitted for interpretation post-operatively. COMPARISON:  None. FINDINGS: Total hip RIGHT arthroplasty.  No fracture or dislocation. Ribbon like density adjacent to the greater trochanter. IMPRESSION: 1. RIGHT hip total  arthroplasty without complication. 2. Ribbon like density within the surgical field adjacent to the RIGHT greater trochanter. Presumably intraoperative sponge marker. Recommend correlation with postoperative films. Electronically Signed   By: Suzy Bouchard M.D.   On: 05/05/2018 12:43    EKG: Orders placed or performed in visit on 04/07/18   EKG     Hospital Course: Martha Richard is a 85 y.o. who was admitted to [facility]. They were brought to the operating room on 05/05/2018 and underwent Procedure(s): [place].  Patient tolerated the procedure well and was later transferred to the recovery room and then to the orthopaedic floor for postoperative care. They were given PO and IV analgesics for pain control following their surgery. They were given 24 hours of postoperative antibiotics of  Anti-infectives (From admission, onward)   Start     Dose/Rate Route Frequency Ordered Stop   05/05/18 1800  ceFAZolin (ANCEF) IVPB 1 g/50 mL premix     1 g 100 mL/hr over 30 Minutes Intravenous Every 6 hours 05/05/18 1506 05/06/18 0017   05/05/18 1400  ceFAZolin (ANCEF) IVPB 2g/100 mL premix  Status:  Discontinued     2 g 200 mL/hr over 30 Minutes Intravenous Every 8 hours 05/05/18 1000 05/05/18 1506     and started on DVT prophylaxis in the form of Xarelto.   PT and OT were ordered for total joint protocol. Discharge planning consulted to help with postop disposition and equipment needs. Patient had a decent night on the evening of surgery. They started to get up OOB with therapy on POD #1. Hemovac drain was pulled without difficulty on day one. Continued to work with therapy into POD #2. Pt was seen during rounds on day two and was ready to go home pending progress with therapy. Discontinued IV pain medication and discussed minimizing use of narcotics as tolerated to improve fatigue. Dressing was changed and the incision was clean, dry, and intact with no  drainage. Pt worked with therapy for two additional sessions and was meeting their goals. She was discharged to home later that day in stable condition.  Diet: Regular diet Activity: WBAT Follow-up: in 2 weeks Disposition: Home with HHPT Discharged Condition: stable   Discharge Instructions    Call MD / Call 911   Complete by:  As directed    If you experience chest pain or shortness of breath, CALL 911 and be transported to the hospital emergency room.  If you develope a fever above 101 F, pus (white drainage) or increased drainage or redness at the wound, or calf pain, call your surgeon's office.   Change dressing   Complete by:  As directed    You may change the dressing daily with sterile 4 x 4 inch gauze dressing and paper tape.   Constipation Prevention   Complete by:  As directed    Drink plenty of fluids.  Prune juice may be helpful.  You may use a stool softener, such as Colace (  over the counter) 100 mg twice a day.  Use MiraLax (over the counter) for constipation as needed.   Diet - low sodium heart healthy   Complete by:  As directed    Discharge instructions   Complete by:  As directed    Dr. Gaynelle Arabian Total Joint Specialist [facility] [address] [place], [place] [address] [phone]  ANTERIOR APPROACH TOTAL HIP REPLACEMENT POSTOPERATIVE DIRECTIONS   Hip Rehabilitation, Guidelines Following Surgery  The results of a hip operation are greatly improved after range of motion and muscle strengthening exercises. Follow all safety measures which are given to protect your hip. If any of these exercises cause increased pain or swelling in your joint, decrease the amount until you are comfortable again. Then slowly increase the exercises. Call your caregiver if you have problems or questions.   HOME CARE INSTRUCTIONS  Remove items at home which could result in a fall. This includes throw rugs or furniture in walking pathways.  ICE to the affected hip  every three hours for 30 minutes at a time and then as needed for pain and swelling.  Continue to use ice on the hip for pain and swelling from surgery. You may notice swelling that will progress down to the foot and ankle.  This is normal after surgery.  Elevate the leg when you are not up walking on it.   Continue to use the breathing machine which will help keep your temperature down.  It is common for your temperature to cycle up and down following surgery, especially at night when you are not up moving around and exerting yourself.  The breathing machine keeps your lungs expanded and your temperature down.  DIET You may resume your previous home diet once your are discharged from the hospital.  DRESSING / WOUND CARE / SHOWERING You may change your dressing 3-5 days after surgery.  Then change the dressing every day with sterile gauze.  Please use good hand washing techniques before changing the dressing.  Do not use any lotions or creams on the incision until instructed by your surgeon. You may start showering once you are discharged home but do not submerge the incision under water. Just pat the incision dry and apply a dry gauze dressing on daily. Change the surgical dressing daily and reapply a dry dressing each time.  ACTIVITY Walk with your walker as instructed. Use walker as long as suggested by your caregivers. Avoid periods of inactivity such as sitting longer than an hour when not asleep. This helps prevent blood clots.  You may resume a sexual relationship in one month or when given the OK by your doctor.  You may return to work once you are cleared by your doctor.  Do not drive a car for 6 weeks or until released by you surgeon.  Do not drive while taking narcotics.  WEIGHT BEARING Weight bearing as tolerated with assist device (walker, cane, etc) as directed, use it as long as suggested by your surgeon or therapist, typically at least 4-6 weeks.  POSTOPERATIVE CONSTIPATION  PROTOCOL Constipation - defined medically as fewer than three stools per week and severe constipation as less than one stool per week.  One of the most common issues patients have following surgery is constipation.  Even if you have a regular bowel pattern at home, your normal regimen is likely to be disrupted due to multiple reasons following surgery.  Combination of anesthesia, postoperative narcotics, change in appetite and fluid intake  all can affect your bowels.  In order to avoid complications following surgery, here are some recommendations in order to help you during your recovery period.  Colace (docusate) - Pick up an over-the-counter form of Colace or another stool softener and take twice a day as long as you are requiring postoperative pain medications.  Take with a full glass of water daily.  If you experience loose stools or diarrhea, hold the colace until you stool forms back up.  If your symptoms do not get better within 1 week or if they get worse, check with your doctor.  Dulcolax (bisacodyl) - Pick up over-the-counter and take as directed by the product packaging as needed to assist with the movement of your bowels.  Take with a full glass of water.  Use this product as needed if not relieved by Colace only.   MiraLax (polyethylene glycol) - Pick up over-the-counter to have on hand.  MiraLax is a solution that will increase the amount of water in your bowels to assist with bowel movements.  Take as directed and can mix with a glass of water, juice, soda, coffee, or tea.  Take if you go more than two days without a movement. Do not use MiraLax more than once per day. Call your doctor if you are still constipated or irregular after using this medication for 7 days in a row.  If you continue to have problems with postoperative constipation, please contact the office for further assistance and recommendations.  If you experience "the worst abdominal pain ever" or develop nausea or  vomiting, please contact the office immediatly for further recommendations for treatment.  ITCHING  If you experience itching with your medications, try taking only a single pain pill, or even half a pain pill at a time.  You can also use Benadryl over the counter for itching or also to help with sleep.   TED HOSE STOCKINGS Wear the elastic stockings on both legs for three weeks following surgery during the day but you may remove then at night for sleeping.  MEDICATIONS See your medication summary on the "After Visit Summary" that the nursing staff will review with you prior to discharge.  You may have some home medications which will be placed on hold until you complete the course of blood thinner medication.  It is important for you to complete the blood thinner medication as prescribed by your surgeon.  Continue your approved medications as instructed at time of discharge.  PRECAUTIONS If you experience chest pain or shortness of breath - call 911 immediately for transfer to the hospital emergency department.  If you develop a fever greater that 101 F, purulent drainage from wound, increased redness or drainage from wound, foul odor from the wound/dressing, or calf pain - CONTACT YOUR SURGEON.                                                   FOLLOW-UP APPOINTMENTS Make sure you keep all of your appointments after your operation with your surgeon and caregivers. You should call the office at the above phone number and make an appointment for approximately two weeks after the date of your surgery or on the date instructed by your surgeon outlined in the "After Visit Summary".  RANGE OF MOTION AND STRENGTHENING EXERCISES  These exercises are designed to help you  keep full movement of your hip joint. Follow your caregiver's or physical therapist's instructions. Perform all exercises about fifteen times, three times per day or as directed. Exercise both hips, even if you have had only one joint  replacement. These exercises can be done on a training (exercise) mat, on the floor, on a table or on a bed. Use whatever works the best and is most comfortable for you. Use music or television while you are exercising so that the exercises are a pleasant break in your day. This will make your life better with the exercises acting as a break in routine you can look forward to.  Lying on your back, slowly slide your foot toward your buttocks, raising your knee up off the floor. Then slowly slide your foot back down until your leg is straight again.  Lying on your back spread your legs as far apart as you can without causing discomfort.  Lying on your side, raise your upper leg and foot straight up from the floor as far as is comfortable. Slowly lower the leg and repeat.  Lying on your back, tighten up the muscle in the front of your thigh (quadriceps muscles). You can do this by keeping your leg straight and trying to raise your heel off the floor. This helps strengthen the largest muscle supporting your knee.  Lying on your back, tighten up the muscles of your buttocks both with the legs straight and with the knee bent at a comfortable angle while keeping your heel on the floor.   IF YOU ARE TRANSFERRED TO A SKILLED REHAB FACILITY If the patient is transferred to a skilled rehab facility following release from the hospital, a list of the current medications will be sent to the facility for the patient to continue.  When discharged from the skilled rehab facility, please have the facility set up the patient's [place] prior to being released. Also, the skilled facility will be responsible for providing the patient with their medications at time of release from the facility to include their pain medication, the muscle relaxants, and their blood thinner medication. If the patient is still at the rehab facility at time of the two week follow up appointment, the skilled rehab facility will  also need to assist the patient in arranging follow up appointment in our office and any transportation needs.  MAKE SURE YOU:  Understand these instructions.  Get help right away if you are not doing well or get worse.    Pick up stool softner and laxative for home use following surgery while on pain medications. Do not submerge incision under water. Please use good hand washing techniques while changing dressing each day. May shower starting three days after surgery. Please use a clean towel to pat the incision dry following showers. Continue to use ice for pain and swelling after surgery. Do not use any lotions or creams on the incision until instructed by your surgeon.   Do not sit on low chairs, stoools or toilet seats, as it may be difficult to get up from low surfaces   Complete by:  As directed    Driving restrictions   Complete by:  As directed    No driving for two weeks   TED hose   Complete by:  As directed    Use stockings (TED hose) for three weeks on both leg(s).  You may remove them at night for sleeping.   Weight bearing as tolerated   Complete by:  As directed      Allergies as of 05/07/2018      Reactions   Ace Inhibitors Cough   Caffeine Other (See Comments)   "shoots my BP up"   Nsaids Other (See Comments)   GI upset    Peanut-containing Drug Products Other (See Comments)   headaches   Ultram [tramadol Hcl] Other (See Comments)   Headaches. Patient is unsure of this.   Penicillins Itching, Rash   Has patient had a PCN reaction causing immediate rash, facial/tongue/throat swelling, SOB or lightheadedness with hypotension: No Has patient had a PCN reaction causing severe rash involving mucus membranes or skin necrosis: No Has patient had a PCN reaction that required hospitalization No Has patient had a PCN reaction occurring within the last 10 years: No If all of the above answers are "NO", then may proceed with Cephalosporin use.      Medication List     TAKE these medications   hydrochlorothiazide 12.5 MG tablet Commonly known as:  HYDRODIURIL Take 12.5 mg by mouth daily as needed (high blood pressure).   HYDROcodone-acetaminophen 5-325 MG tablet Commonly known as:  NORCO/VICODIN Take 1-2 tablets by mouth every 6 (six) hours as needed for moderate pain (pain score 4-6).   methocarbamol 500 MG tablet Commonly known as:  ROBAXIN Take 1 tablet (500 mg total) by mouth every 6 (six) hours as needed for muscle spasms.   rivaroxaban 10 MG Tabs tablet Commonly known as:  XARELTO Take 1 tablet (10 mg total) by mouth daily with breakfast. Then take one 81 mg aspirin once a day for three weeks. Then discontinue aspirin.            Discharge Care Instructions  (From admission, onward)         Start     Ordered   05/07/18 0000  Weight bearing as tolerated     05/07/18 0732   05/07/18 0000  Change dressing    Comments:  You may change the dressing daily with sterile 4 x 4 inch gauze dressing and paper tape.   05/07/18 0732         Follow-up Information    Gaynelle Arabian, MD. Schedule an appointment as soon as possible for a visit on 05/20/2018.   Specialty:  Orthopedic Surgery Contact information: [address] [place] [address] [phone]           Signed: Theresa Duty, PA-C Orthopedic Surgery 05/12/2018, 10:38 AM

## 2018-05-31 DIAGNOSIS — G2581 Restless legs syndrome: Secondary | ICD-10-CM | POA: Diagnosis not present

## 2018-06-01 ENCOUNTER — Encounter (INDEPENDENT_AMBULATORY_CARE_PROVIDER_SITE_OTHER): Payer: Medicare Other | Admitting: Ophthalmology

## 2018-06-01 DIAGNOSIS — I10 Essential (primary) hypertension: Secondary | ICD-10-CM | POA: Diagnosis not present

## 2018-06-01 DIAGNOSIS — K210 Gastro-esophageal reflux disease with esophagitis: Secondary | ICD-10-CM | POA: Diagnosis not present

## 2018-06-01 DIAGNOSIS — R7301 Impaired fasting glucose: Secondary | ICD-10-CM | POA: Diagnosis not present

## 2018-06-30 DIAGNOSIS — I951 Orthostatic hypotension: Secondary | ICD-10-CM | POA: Diagnosis not present

## 2018-06-30 DIAGNOSIS — M1991 Primary osteoarthritis, unspecified site: Secondary | ICD-10-CM | POA: Diagnosis not present

## 2018-06-30 DIAGNOSIS — K210 Gastro-esophageal reflux disease with esophagitis: Secondary | ICD-10-CM | POA: Diagnosis not present

## 2018-06-30 DIAGNOSIS — I10 Essential (primary) hypertension: Secondary | ICD-10-CM | POA: Diagnosis not present

## 2018-06-30 DIAGNOSIS — E7800 Pure hypercholesterolemia, unspecified: Secondary | ICD-10-CM | POA: Diagnosis not present

## 2018-09-17 DIAGNOSIS — Z961 Presence of intraocular lens: Secondary | ICD-10-CM | POA: Diagnosis not present

## 2018-09-17 DIAGNOSIS — H40021 Open angle with borderline findings, high risk, right eye: Secondary | ICD-10-CM | POA: Diagnosis not present

## 2018-09-17 DIAGNOSIS — H401121 Primary open-angle glaucoma, left eye, mild stage: Secondary | ICD-10-CM | POA: Diagnosis not present

## 2018-09-17 DIAGNOSIS — H34832 Tributary (branch) retinal vein occlusion, left eye, with macular edema: Secondary | ICD-10-CM | POA: Diagnosis not present

## 2018-11-10 ENCOUNTER — Encounter (INDEPENDENT_AMBULATORY_CARE_PROVIDER_SITE_OTHER): Payer: Self-pay | Admitting: Ophthalmology

## 2018-11-10 ENCOUNTER — Other Ambulatory Visit: Payer: Self-pay

## 2018-11-10 ENCOUNTER — Ambulatory Visit (INDEPENDENT_AMBULATORY_CARE_PROVIDER_SITE_OTHER): Payer: Medicare Other | Admitting: Ophthalmology

## 2018-11-10 DIAGNOSIS — H43812 Vitreous degeneration, left eye: Secondary | ICD-10-CM

## 2018-11-10 DIAGNOSIS — H40002 Preglaucoma, unspecified, left eye: Secondary | ICD-10-CM

## 2018-11-10 DIAGNOSIS — I10 Essential (primary) hypertension: Secondary | ICD-10-CM | POA: Diagnosis not present

## 2018-11-10 DIAGNOSIS — H34832 Tributary (branch) retinal vein occlusion, left eye, with macular edema: Secondary | ICD-10-CM | POA: Diagnosis not present

## 2018-11-10 DIAGNOSIS — H26492 Other secondary cataract, left eye: Secondary | ICD-10-CM

## 2018-11-10 DIAGNOSIS — Z961 Presence of intraocular lens: Secondary | ICD-10-CM

## 2018-11-10 DIAGNOSIS — H3581 Retinal edema: Secondary | ICD-10-CM

## 2018-11-10 DIAGNOSIS — H35033 Hypertensive retinopathy, bilateral: Secondary | ICD-10-CM | POA: Diagnosis not present

## 2018-11-10 MED ORDER — BEVACIZUMAB CHEMO INJECTION 1.25MG/0.05ML SYRINGE FOR KALEIDOSCOPE
1.2500 mg | INTRAVITREAL | Status: AC | PRN
  Administered 2018-11-10: 1.25 mg via INTRAVITREAL

## 2018-11-10 NOTE — Progress Notes (Signed)
[facility] Note  11/10/2018     CHIEF COMPLAINT Patient presents for Retina Follow Up   HISTORY OF PRESENT ILLNESS: Martha Richard is a 85 y.o. female who presents to the clinic today for:   HPI    Retina Follow Up    Patient presents with  CRVO/BRVO.  In left eye.  This started months ago.  Severity is moderate.  Duration of 7 months.  Since onset it is stable.  I, the attending physician,  performed the HPI with the patient and updated documentation appropriately.          Comments    85 y/o female pt here for 7 mo f/u for BRVO OS.  Feels [facility] OD may be a little worse.  No change in [place] OS.  Denies pain, flashes, floaters.  AT prn OU.       Last edited by Bernarda Caffey, MD on 11/10/2018  4:21 PM. (History)    pt states she feels like her vision is doing well  Referring physician: Manon Hilding, MD [place],  [place] [address]  HISTORICAL INFORMATION:   Selected notes from the MEDICAL RECORD NUMBER Referred by Dr. Read Drivers for concern of cystic IRF OS;  LEE- 12.18.18 (C. Weaver) [BCVA OD: 20/30 OS: 20/50] Ocular Hx- pseudophakia OU, S/P YAG Cap OU PMH- HTN, former smoker    CURRENT MEDICATIONS: No current outpatient medications on file. (Ophthalmic Drugs)   No current facility-administered medications for this visit.  (Ophthalmic Drugs)   Current Outpatient Medications (Other)  Medication Sig  . hydrochlorothiazide (HYDRODIURIL) 12.5 MG tablet Take 12.5 mg by mouth daily as needed (high blood pressure).   Marland Kitchen HYDROcodone-acetaminophen (NORCO/VICODIN) 5-325 MG tablet Take 1-2 tablets by mouth every 6 (six) hours as needed for moderate pain (pain score 4-6).  Marland Kitchen methocarbamol (ROBAXIN) 500 MG tablet Take 1 tablet (500 mg total) by mouth every 6 (six) hours as needed for muscle spasms.  . rivaroxaban (XARELTO) 10 MG TABS tablet Take 1 tablet (10 mg total) by mouth daily with breakfast. Then take one 81 mg aspirin once a day for three  weeks. Then discontinue aspirin.  Marland Kitchen rOPINIRole (REQUIP) 0.25 MG tablet Take 0.25 mg by mouth at bedtime.   Current Facility-Administered Medications (Other)  Medication Route  . Bevacizumab (AVASTIN) SOLN 1.25 mg Intravitreal  . Bevacizumab (AVASTIN) SOLN 1.25 mg Intravitreal  . Bevacizumab (AVASTIN) SOLN 1.25 mg Intravitreal  . Bevacizumab (AVASTIN) SOLN 1.25 mg Intravitreal  . Bevacizumab (AVASTIN) SOLN 1.25 mg Intravitreal  . Bevacizumab (AVASTIN) SOLN 1.25 mg Intravitreal      REVIEW OF SYSTEMS: ROS    Positive for: Musculoskeletal, Eyes   Negative for: Constitutional, Gastrointestinal, Neurological, Skin, Genitourinary, HENT, Endocrine, Cardiovascular, Respiratory, Psychiatric, Allergic/Imm, Heme/Lymph   Last edited by Matthew Folks, COA on 11/10/2018  2:43 PM. (History)       ALLERGIES Allergies  Allergen Reactions  . Ace Inhibitors Cough  . Caffeine Other (See Comments)    "shoots my BP up"  . Nsaids Other (See Comments)    GI upset    . Peanut-Containing Drug Products Other (See Comments)    headaches  . Ultram [Tramadol Hcl] Other (See Comments)    Headaches. Patient is unsure of this.  . Penicillins Itching and Rash    Has patient had a PCN reaction causing immediate rash, facial/tongue/throat swelling, SOB or lightheadedness with hypotension: No Has patient had a PCN reaction causing severe  rash involving mucus membranes or skin necrosis: No Has patient had a PCN reaction that required hospitalization No Has patient had a PCN reaction occurring within the last 10 years: No If all of the above answers are "NO", then may proceed with Cephalosporin use.     PAST MEDICAL HISTORY Past Medical History:  Diagnosis Date  . Arrhythmia    palpitations  . Arthritis   . Cancer (HCC)    squamous cell Left hand  . Complication of anesthesia    slow to wake up  . Headache    hx of migraines  . Hypertension   . Hypertensive retinopathy    OU   Past Surgical  History:  Procedure Laterality Date  . APPENDECTOMY    . CATARACT EXTRACTION Bilateral    OU about 10 yrs ago  . EYE SURGERY    . fibroadenoma left breast removal    . OTHER SURGICAL HISTORY     appendectomy  . TOTAL HIP ARTHROPLASTY Left 06/18/2016   Procedure: LEFT TOTAL HIP ARTHROPLASTY ANTERIOR APPROACH;  Surgeon: Gaynelle Arabian, MD;  Location: [facility];  Service: Orthopedics;  Laterality: Left;  . TOTAL HIP ARTHROPLASTY Right 05/05/2018   Procedure: TOTAL HIP ARTHROPLASTY ANTERIOR APPROACH;  Surgeon: Gaynelle Arabian, MD;  Location: [facility];  Service: Orthopedics;  Laterality: Right;  . UTERINE FIBROID SURGERY      FAMILY HISTORY Family History  Problem Relation Age of Onset  . Heart disease Mother   . Heart attack Mother   . Heart failure Mother   . Stroke Father   . Heart attack Maternal Aunt   . Heart disease Maternal Aunt   . Heart failure Maternal Aunt   . Amblyopia Neg Hx   . Blindness Neg Hx   . Cataracts Neg Hx   . Glaucoma Neg Hx   . Macular degeneration Neg Hx   . Retinal detachment Neg Hx   . Strabismus Neg Hx   . Retinitis pigmentosa Neg Hx     SOCIAL HISTORY Social History   Tobacco Use  . Smoking status: Former Research scientist (life sciences)  . Smokeless tobacco: Never Used  . Tobacco comment: years ago 50 plus years  Substance Use Topics  . Alcohol use: No  . Drug use: No         OPHTHALMIC EXAM:  Base Eye Exam    Visual Acuity (Snellen - Linear)      Right Left   Dist cc 20/20 +2 20/25 -2   Dist ph cc  NI       Tonometry (Tonopen, 2:45 PM)      Right Left   Pressure 15 15       Pupils      Dark Light Shape React APD   Right 2 1.5 Round Slow None   Left 2 1.5 Round Slow None       Visual Fields (Counting fingers)      Left Right    Full Full       Extraocular Movement      Right Left    Full, Ortho Full, Ortho       Neuro/Psych    Oriented x3: Yes   Mood/Affect: Normal       Dilation    Both eyes: 1.0% Mydriacyl, 2.5% Phenylephrine @ 2:45 PM         Slit Lamp and Fundus Exam    Slit Lamp Exam      Right Left   Lids/Lashes Dermatochalasis - upper lid, Meibomian  gland dysfunction, Scurf Dermatochalasis - upper lid, Meibomian gland dysfunction   Conjunctiva/Sclera White and quiet White and quiet   Cornea Arcus,  2+ Punctate epithelial erosions, Debris in tear film, Well healed cataract wounds, nasal and temporal LRI Mild arcus, 2+ Punctate epithelial erosions, nasal and temporal LRI, Debris in tear film   Anterior Chamber Deep and quiet Deep and quiet   Iris Round and moderately dilated Round and moderately dilated    Lens PC IOL in good position with open PC PC IOL in good position, open PC   Vitreous Vitreous syneresis Vitreous syneresis, Posterior vitreous detachment       Fundus Exam      Right Left   Disc Disc heme and focal edema at 0430, Pink and Sharp Tilted, +collateral vessels temporally, vascular loops, inferior rim thinning, Pink and Sharp, +cupping   C/D Ratio 0.2 0.65   Macula Blunted foveal reflex, Retinal pigment epithelial mottling, No heme or edema Blunted foveal reflex; central IRF and fine exudate superior macula   Vessels Inferior vessels 3+tortuosity, Vascular attenuation, Impending BRVO inferiorly, dilated venuels (inferior>superior) Vascular attenuation, Tortuous, AV crossing changes, dilated venules    Periphery Attached, scattered MAs Attached, scattered MAs          IMAGING AND PROCEDURES  Imaging and Procedures for 05/25/17  OCT, Retina - OU - Both Eyes       Right Eye Quality was good. Central Foveal Thickness: 274. Progression has been stable. Findings include normal foveal contour, no IRF, no SRF, epiretinal membrane, intraretinal hyper-reflective material (+IRHM and cystic change temporal fovea - improved from prior).   Left Eye Quality was good. Central Foveal Thickness: 405. Progression has improved. Findings include epiretinal membrane, no SRF, abnormal foveal contour, intraretinal  fluid, intraretinal hyper-reflective material (Mild Interval improvement in IRF/cystic changes).   Notes *Images captured and stored on drive  Diagnosis / Impression:  OD: NFP, No IRF/SRF, trace ERM; +IRHM and cystic change temporal fovea - improved from prior OS: Mild Interval improvement in IRF/cystic changes   Clinical management:  See below  Abbreviations: NFP - Normal foveal profile. CME - cystoid macular edema. PED - pigment epithelial detachment. IRF - intraretinal fluid. SRF - subretinal fluid. EZ - ellipsoid zone. ERM - epiretinal membrane. ORA - outer retinal atrophy. ORT - outer retinal tubulation. SRHM - subretinal hyper-reflective material         Intravitreal Injection, Pharmacologic Agent - OS - Left Eye       Time Out 11/10/2018. 3:26 PM. Confirmed correct patient, procedure, site, and patient consented.   Anesthesia Topical anesthesia was used. Anesthetic medications included Lidocaine 2%, Proparacaine 0.5%.   Procedure Preparation included 5% betadine to ocular surface, eyelid speculum. A 30 gauge needle was used.   Injection:  1.25 mg Bevacizumab (AVASTIN) SOLN   NDC: [id number], Lot: [id number] , Expiration date: 01/04/2019   Route: Intravitreal, Site: Left Eye, Waste: 0 mL  Post-op Post injection exam found visual acuity of at least counting fingers. The patient tolerated the procedure well. There were no complications. The patient received written and verbal post procedure care education.                 ASSESSMENT/PLAN:    ICD-10-CM   1. Branch retinal vein occlusion of left eye with macular edema  H34.8320 Intravitreal Injection, Pharmacologic Agent - OS - Left Eye    Bevacizumab (AVASTIN) SOLN 1.25 mg  2. Retinal edema  H35.81 OCT, Retina - OU - Both Eyes  3.  Essential hypertension  I10   4. Hypertensive retinopathy of both eyes  H35.033   5. Posterior vitreous detachment of left eye  H43.812   6. Glaucoma suspect of left eye   H40.002   7. Pseudophakia of both eyes  Z96.1   8. PCO (posterior capsular opacification), left  H26.492     1,2. BRVO w/ macular edema OS  - on presenting exam, superior temporal venule was dilated and tortuous but no significant intraretinal hemorrhages suggesting perhaps a remote BRVO event has occurred  - FA at presentation (01.23.19) shows significant leakage from branches from the superior temporal venules corresponding to central cystic changes noted on OCT  - S/P IVA OS #1 (01.23.19), #2 (02.25.19), #3 (04.01.19), #4 (05.07.19), #5 (06.25.19), #6 (02.21.20)  - lost to follow up since February 2020  - OCT today shows mild interval improvement in IRF despite delayed follow up  - interestingly, BCVA 20/25  - recommend IVA OS #7 today (09.16.20)  - RBA of procedure discussed, questions answered  - informed consent obtained and signed  - see procedure note  - F/U 4-6 weeks -- DFE/OCT/possible injection  3,4. Hypertensive retinopathy OU  - discussed importance of tight BP control  - monitor  5. PVD OS  - Discussed findings and prognosis  - No RT or RD on 360 peripheral exam  - Reviewed s/s of RT/RD  - Strict return precautions for any such RT/RD signs/symptoms  6. Glaucoma Suspect OS  - under the expert management of Dr. Kathlen Mody  - seen in consultation on July 25th 2019  7, 8. Pseudophakia OU  - s/p CE/IOL OU [facility] yrs ago at [facility]  - s/p YAG capsulotomy OU  - OS re-YAG'd by Dr. Kathlen Mody -- PC opening improved  - monitor   Ophthalmic Meds Ordered this visit:  Meds ordered this encounter  Medications  . Bevacizumab (AVASTIN) SOLN 1.25 mg      Return for f/u 4-6 weeks, BRVO OS, DFE, OCT.  There are no Patient Instructions on file for this visit.   Explained the diagnoses, plan, and follow up with the patient and they expressed understanding.  Patient expressed understanding of the importance of proper follow up care.   This document serves as a record of  services personally performed by Gardiner Sleeper, MD, PhD. It was created on their behalf by Ernest Mallick, OA, an ophthalmic assistant. The creation of this record is the provider's dictation and/or activities during the visit.    Electronically signed by: Ernest Mallick, OA  09.16.2020 9:00 PM    Gardiner Sleeper, M.D., Ph.D. Diseases & Surgery of the Retina and Vitreous [facility]  I have reviewed the above documentation for accuracy and completeness, and I agree with the above. Gardiner Sleeper, M.D., Ph.D. 11/10/18 9:02 PM    Abbreviations: M myopia (nearsighted); A astigmatism; H hyperopia (farsighted); P presbyopia; Mrx spectacle prescription;  CTL contact lenses; OD right eye; OS left eye; OU both eyes  XT exotropia; ET esotropia; PEK punctate epithelial keratitis; PEE punctate epithelial erosions; DES dry eye syndrome; MGD meibomian gland dysfunction; ATs artificial tears; PFAT's preservative free artificial tears; [place] nuclear sclerotic cataract; PSC posterior subcapsular cataract; ERM epi-retinal membrane; PVD posterior vitreous detachment; RD retinal detachment; DM diabetes mellitus; DR diabetic retinopathy; NPDR non-proliferative diabetic retinopathy; PDR proliferative diabetic retinopathy; CSME clinically significant macular edema; DME diabetic macular edema; dbh dot blot hemorrhages; CWS cotton wool spot; POAG primary open angle glaucoma; C/D  cup-to-disc ratio; HVF humphrey visual field; GVF goldmann visual field; OCT optical coherence tomography; IOP intraocular pressure; BRVO Branch retinal vein occlusion; CRVO central retinal vein occlusion; CRAO central retinal artery occlusion; BRAO branch retinal artery occlusion; RT retinal tear; SB scleral buckle; PPV pars plana vitrectomy; VH Vitreous hemorrhage; PRP panretinal laser photocoagulation; IVK intravitreal kenalog; VMT vitreomacular traction; MH Macular hole;  NVD neovascularization of the disc; NVE  neovascularization elsewhere; AREDS age related eye disease study; ARMD age related macular degeneration; POAG primary open angle glaucoma; EBMD epithelial/anterior basement membrane dystrophy; ACIOL anterior chamber intraocular lens; IOL intraocular lens; PCIOL posterior chamber intraocular lens; Phaco/IOL phacoemulsification with intraocular lens placement; [place] photorefractive keratectomy; LASIK laser assisted in situ keratomileusis; HTN hypertension; DM diabetes mellitus; COPD chronic obstructive pulmonary disease

## 2018-12-14 NOTE — Progress Notes (Signed)
[facility] Note  12/15/2018     CHIEF COMPLAINT Patient presents for Retina Follow Up   HISTORY OF PRESENT ILLNESS: Martha Richard is a 85 y.o. female who presents to the clinic today for:   HPI    Retina Follow Up    In left eye.  Since onset it is stable.  I, the attending physician,  performed the HPI with the patient and updated documentation appropriately.          Comments    F/U BRVO OS. Patient states her vision is good, denies new visual onsets/issues.       Last edited by Bernarda Caffey, MD on 12/15/2018  2:05 PM. (History)    pt states she feels like her vision is doing well  Referring physician: Hortencia Pilar, MD [place],  [place] [address]  HISTORICAL INFORMATION:   Selected notes from the MEDICAL RECORD NUMBER Referred by Dr. Read Drivers for concern of cystic IRF OS;  LEE- 12.18.18 (C. Weaver) [BCVA OD: 20/30 OS: 20/50] Ocular Hx- pseudophakia OU, S/P YAG Cap OU PMH- HTN, former smoker    CURRENT MEDICATIONS: No current outpatient medications on file. (Ophthalmic Drugs)   No current facility-administered medications for this visit.  (Ophthalmic Drugs)   Current Outpatient Medications (Other)  Medication Sig  . hydrochlorothiazide (HYDRODIURIL) 12.5 MG tablet Take 12.5 mg by mouth daily as needed (high blood pressure).   Marland Kitchen HYDROcodone-acetaminophen (NORCO/VICODIN) 5-325 MG tablet Take 1-2 tablets by mouth every 6 (six) hours as needed for moderate pain (pain score 4-6).  Marland Kitchen methocarbamol (ROBAXIN) 500 MG tablet Take 1 tablet (500 mg total) by mouth every 6 (six) hours as needed for muscle spasms.  . rivaroxaban (XARELTO) 10 MG TABS tablet Take 1 tablet (10 mg total) by mouth daily with breakfast. Then take one 81 mg aspirin once a day for three weeks. Then discontinue aspirin.  Marland Kitchen rOPINIRole (REQUIP) 0.25 MG tablet Take 0.25 mg by mouth at bedtime.   Current Facility-Administered Medications  (Other)  Medication Route  . Bevacizumab (AVASTIN) SOLN 1.25 mg Intravitreal  . Bevacizumab (AVASTIN) SOLN 1.25 mg Intravitreal  . Bevacizumab (AVASTIN) SOLN 1.25 mg Intravitreal  . Bevacizumab (AVASTIN) SOLN 1.25 mg Intravitreal  . Bevacizumab (AVASTIN) SOLN 1.25 mg Intravitreal  . Bevacizumab (AVASTIN) SOLN 1.25 mg Intravitreal      REVIEW OF SYSTEMS:    ALLERGIES Allergies  Allergen Reactions  . Ace Inhibitors Cough  . Caffeine Other (See Comments)    "shoots my BP up"  . Nsaids Other (See Comments)    GI upset    . Peanut-Containing Drug Products Other (See Comments)    headaches  . Ultram [Tramadol Hcl] Other (See Comments)    Headaches. Patient is unsure of this.  . Penicillins Itching and Rash    Has patient had a PCN reaction causing immediate rash, facial/tongue/throat swelling, SOB or lightheadedness with hypotension: No Has patient had a PCN reaction causing severe rash involving mucus membranes or skin necrosis: No Has patient had a PCN reaction that required hospitalization No Has patient had a PCN reaction occurring within the last 10 years: No If all of the above answers are "NO", then may proceed with Cephalosporin use.     PAST MEDICAL HISTORY Past Medical History:  Diagnosis Date  . Arrhythmia    palpitations  . Arthritis   . Cancer (HCC)    squamous cell Left hand  . Complication of  anesthesia    slow to wake up  . Headache    hx of migraines  . Hypertension   . Hypertensive retinopathy    OU   Past Surgical History:  Procedure Laterality Date  . APPENDECTOMY    . CATARACT EXTRACTION Bilateral    OU about 10 yrs ago  . EYE SURGERY    . fibroadenoma left breast removal    . OTHER SURGICAL HISTORY     appendectomy  . TOTAL HIP ARTHROPLASTY Left 06/18/2016   Procedure: LEFT TOTAL HIP ARTHROPLASTY ANTERIOR APPROACH;  Surgeon: Gaynelle Arabian, MD;  Location: [facility];  Service: Orthopedics;  Laterality: Left;  . TOTAL HIP ARTHROPLASTY Right  05/05/2018   Procedure: TOTAL HIP ARTHROPLASTY ANTERIOR APPROACH;  Surgeon: Gaynelle Arabian, MD;  Location: [facility];  Service: Orthopedics;  Laterality: Right;  . UTERINE FIBROID SURGERY      FAMILY HISTORY Family History  Problem Relation Age of Onset  . Heart disease Mother   . Heart attack Mother   . Heart failure Mother   . Stroke Father   . Heart attack Maternal Aunt   . Heart disease Maternal Aunt   . Heart failure Maternal Aunt   . Amblyopia Neg Hx   . Blindness Neg Hx   . Cataracts Neg Hx   . Glaucoma Neg Hx   . Macular degeneration Neg Hx   . Retinal detachment Neg Hx   . Strabismus Neg Hx   . Retinitis pigmentosa Neg Hx     SOCIAL HISTORY Social History   Tobacco Use  . Smoking status: Former Research scientist (life sciences)  . Smokeless tobacco: Never Used  . Tobacco comment: years ago 50 plus years  Substance Use Topics  . Alcohol use: No  . Drug use: No         OPHTHALMIC EXAM:  Base Eye Exam    Visual Acuity (Snellen - Linear)      Right Left   Dist cc 20/20 20/40 +1   Dist ph cc  20/30 -1   Correction: Glasses       Tonometry (Tonopen, 1:25 PM)      Right Left   Pressure 12 14       Pupils      Dark Light Shape React APD   Right 3 2 Round Brisk None   Left 3 2 Round Brisk None       Visual Fields (Counting fingers)      Left Right    Full Full       Extraocular Movement      Right Left    Full, Ortho Full, Ortho       Neuro/Psych    Oriented x3: Yes   Mood/Affect: Normal       Dilation    Both eyes: 1.0% Mydriacyl, 2.5% Phenylephrine @ 1:22 PM        Slit Lamp and Fundus Exam    Slit Lamp Exam      Right Left   Lids/Lashes Dermatochalasis - upper lid, Meibomian gland dysfunction, Scurf Dermatochalasis - upper lid, Meibomian gland dysfunction   Conjunctiva/Sclera White and quiet White and quiet   Cornea Arcus,  2+ Punctate epithelial erosions, Debris in tear film, Well healed cataract wounds, nasal and temporal LRI Mild arcus, 2+ Punctate  epithelial erosions, nasal and temporal LRI, Debris in tear film   Anterior Chamber Deep and quiet Deep and quiet   Iris Round and moderately dilated Round and moderately dilated    Lens PC  IOL in good position with open PC PC IOL in good position, open PC   Vitreous Vitreous syneresis Vitreous syneresis, Posterior vitreous detachment       Fundus Exam      Right Left   Disc Disc heme and focal edema at 0430, Pink and Sharp Tilted, +collateral vessels temporally, vascular loops, inferior rim thinning, Pink and Sharp, +cupping   C/D Ratio 0.2 0.65   Macula Blunted foveal reflex, Retinal pigment epithelial mottling, mild cystic changes, No heme or edema Improved foveal reflex; central IRF and fine exudate superior macula - improved   Vessels Inferior vessels 3+tortuosity, Vascular attenuation, Impending BRVO inferiorly, dilated venuels (inferior>superior) Vascular attenuation, Tortuous, AV crossing changes, dilated venules    Periphery Attached, scattered MAs Attached, scattered MAs          IMAGING AND PROCEDURES  Imaging and Procedures for 05/25/17  OCT, Retina - OU - Both Eyes       Right Eye Quality was good. Central Foveal Thickness: 279. Progression has been stable. Findings include normal foveal contour, no SRF, epiretinal membrane, intraretinal hyper-reflective material, intraretinal fluid (Interval increase in cystic change temporal fovea).   Left Eye Quality was good. Central Foveal Thickness: 252. Progression has improved. Findings include epiretinal membrane, no SRF, intraretinal fluid, intraretinal hyper-reflective material, normal foveal contour (Interval improvement in IRF and foveal profile, decreased ellipsoid signal, centrally).   Notes *Images captured and stored on drive  Diagnosis / Impression:  OD: Interval increase in cystic changes temporal fovea OS: Interval improvement in IRF and foveal profile, decreased ellipsoid signal centrally  Clinical management:   See below  Abbreviations: NFP - Normal foveal profile. CME - cystoid macular edema. PED - pigment epithelial detachment. IRF - intraretinal fluid. SRF - subretinal fluid. EZ - ellipsoid zone. ERM - epiretinal membrane. ORA - outer retinal atrophy. ORT - outer retinal tubulation. SRHM - subretinal hyper-reflective material         Intravitreal Injection, Pharmacologic Agent - OS - Left Eye       Time Out 12/15/2018. 1:26 PM. Confirmed correct patient, procedure, site, and patient consented.   Anesthesia Topical anesthesia was used. Anesthetic medications included Lidocaine 2%, Proparacaine 0.5%.   Procedure Preparation included 5% betadine to ocular surface, eyelid speculum. A supplied needle was used.   Injection:  1.25 mg Bevacizumab (AVASTIN) SOLN   NDC: [id number], Lot: [id number] , Expiration date: 02/09/2019   Route: Intravitreal, Site: Left Eye, Waste: 0 mg  Post-op Post injection exam found visual acuity of at least counting fingers. The patient tolerated the procedure well. There were no complications. The patient received written and verbal post procedure care education.                 ASSESSMENT/PLAN:    ICD-10-CM   1. Branch retinal vein occlusion of left eye with macular edema  H34.8320 Intravitreal Injection, Pharmacologic Agent - OS - Left Eye    Bevacizumab (AVASTIN) SOLN 1.25 mg  2. Retinal edema  H35.81 OCT, Retina - OU - Both Eyes  3. Essential hypertension  I10   4. Hypertensive retinopathy of both eyes  H35.033   5. Posterior vitreous detachment of left eye  H43.812   6. Glaucoma suspect of left eye  H40.002   7. Pseudophakia of both eyes  Z96.1   8. PCO (posterior capsular opacification), left  H26.492     1,2. BRVO w/ macular edema OS  - on presenting exam, superior temporal venule was dilated and tortuous but no  significant intraretinal hemorrhages suggesting perhaps a remote BRVO event had occurred  - FA at presentation (01.23.19)  shows significant leakage from branches from the superior temporal venules corresponding to central cystic changes noted on OCT  - S/P IVA OS #1 (01.23.19), #2 (02.25.19), #3 (04.01.19), #4 (05.07.19), #5 (06.25.19), #6 (02.21.20), #7 (09.16.20)  - lost to follow up from February to Sept 2020  - OCT today shows interval improvement in IRF and foveal profile, decreased ellipsoid signal centrally  - BCVA 20/30 from 20/25 OS  - recommend IVA OS #8 today (10.21.20)  - RBA of procedure discussed, questions answered  - informed consent obtained and signed  - see procedure note  - F/U 4-5 weeks -- DFE/OCT/possible injection  3,4. Hypertensive retinopathy OU  - discussed importance of tight BP control  - monitor  5. PVD OS  - Discussed findings and prognosis  - No RT or RD on 360 peripheral exam  - Reviewed s/s of RT/RD  - Strict return precautions for any such RT/RD signs/symptoms  6. Glaucoma Suspect OS  - under the expert management of Dr. Kathlen Mody  7,8. Pseudophakia OU  - s/p [facility]  - s/p YAG capsulotomy OU  - OS re-YAG'd by Dr. Kathlen Mody -- PC opening improved  - monitor   Ophthalmic Meds Ordered this visit:  Meds ordered this encounter  Medications  . Bevacizumab (AVASTIN) SOLN 1.25 mg      Return for f/u 4-5 weeks, BRVO OS, DFE, OCT.  There are no Patient Instructions on file for this visit.   Explained the diagnoses, plan, and follow up with the patient and they expressed understanding.  Patient expressed understanding of the importance of proper follow up care.   This document serves as a record of services personally performed by Gardiner Sleeper, MD, PhD. It was created on their behalf by Ernest Mallick, OA, an ophthalmic assistant. The creation of this record is the provider's dictation and/or activities during the visit.    Electronically signed by: Ernest Mallick, OA 10.20.2020 8:35 PM   Gardiner Sleeper, M.D., Ph.D. Diseases & Surgery  of the Retina and Vitreous [facility]  I have reviewed the above documentation for accuracy and completeness, and I agree with the above. Gardiner Sleeper, M.D., Ph.D. 12/15/18 8:35 PM    Abbreviations: M myopia (nearsighted); A astigmatism; H hyperopia (farsighted); P presbyopia; Mrx spectacle prescription;  CTL contact lenses; OD right eye; OS left eye; OU both eyes  XT exotropia; ET esotropia; PEK punctate epithelial keratitis; PEE punctate epithelial erosions; DES dry eye syndrome; MGD meibomian gland dysfunction; ATs artificial tears; PFAT's preservative free artificial tears; [place] nuclear sclerotic cataract; PSC posterior subcapsular cataract; ERM epi-retinal membrane; PVD posterior vitreous detachment; RD retinal detachment; DM diabetes mellitus; DR diabetic retinopathy; NPDR non-proliferative diabetic retinopathy; PDR proliferative diabetic retinopathy; CSME clinically significant macular edema; DME diabetic macular edema; dbh dot blot hemorrhages; CWS cotton wool spot; POAG primary open angle glaucoma; C/D cup-to-disc ratio; HVF humphrey visual field; GVF goldmann visual field; OCT optical coherence tomography; IOP intraocular pressure; BRVO Branch retinal vein occlusion; CRVO central retinal vein occlusion; CRAO central retinal artery occlusion; BRAO branch retinal artery occlusion; RT retinal tear; SB scleral buckle; PPV pars plana vitrectomy; VH Vitreous hemorrhage; PRP panretinal laser photocoagulation; IVK intravitreal kenalog; VMT vitreomacular traction; MH Macular hole;  NVD neovascularization of the disc; NVE neovascularization elsewhere; AREDS age related eye disease study; ARMD age related macular  degeneration; POAG primary open angle glaucoma; EBMD epithelial/anterior basement membrane dystrophy; ACIOL anterior chamber intraocular lens; IOL intraocular lens; PCIOL posterior chamber intraocular lens; Phaco/IOL phacoemulsification with intraocular lens placement; [place]  photorefractive keratectomy; LASIK laser assisted in situ keratomileusis; HTN hypertension; DM diabetes mellitus; COPD chronic obstructive pulmonary disease

## 2018-12-15 ENCOUNTER — Ambulatory Visit (INDEPENDENT_AMBULATORY_CARE_PROVIDER_SITE_OTHER): Payer: Medicare Other | Admitting: Ophthalmology

## 2018-12-15 ENCOUNTER — Encounter (INDEPENDENT_AMBULATORY_CARE_PROVIDER_SITE_OTHER): Payer: Self-pay | Admitting: Ophthalmology

## 2018-12-15 DIAGNOSIS — H34832 Tributary (branch) retinal vein occlusion, left eye, with macular edema: Secondary | ICD-10-CM | POA: Diagnosis not present

## 2018-12-15 DIAGNOSIS — I10 Essential (primary) hypertension: Secondary | ICD-10-CM | POA: Diagnosis not present

## 2018-12-15 DIAGNOSIS — H3581 Retinal edema: Secondary | ICD-10-CM

## 2018-12-15 DIAGNOSIS — H40002 Preglaucoma, unspecified, left eye: Secondary | ICD-10-CM

## 2018-12-15 DIAGNOSIS — Z961 Presence of intraocular lens: Secondary | ICD-10-CM

## 2018-12-15 DIAGNOSIS — H35033 Hypertensive retinopathy, bilateral: Secondary | ICD-10-CM

## 2018-12-15 DIAGNOSIS — H43812 Vitreous degeneration, left eye: Secondary | ICD-10-CM

## 2018-12-15 DIAGNOSIS — H26492 Other secondary cataract, left eye: Secondary | ICD-10-CM

## 2018-12-15 MED ORDER — BEVACIZUMAB CHEMO INJECTION 1.25MG/0.05ML SYRINGE FOR KALEIDOSCOPE
1.2500 mg | INTRAVITREAL | Status: AC | PRN
  Administered 2018-12-15: 1.25 mg via INTRAVITREAL

## 2018-12-29 DIAGNOSIS — Z23 Encounter for immunization: Secondary | ICD-10-CM | POA: Diagnosis not present

## 2018-12-29 DIAGNOSIS — I10 Essential (primary) hypertension: Secondary | ICD-10-CM | POA: Diagnosis not present

## 2018-12-29 DIAGNOSIS — R7301 Impaired fasting glucose: Secondary | ICD-10-CM | POA: Diagnosis not present

## 2018-12-29 DIAGNOSIS — E7800 Pure hypercholesterolemia, unspecified: Secondary | ICD-10-CM | POA: Diagnosis not present

## 2018-12-31 DIAGNOSIS — I951 Orthostatic hypotension: Secondary | ICD-10-CM | POA: Diagnosis not present

## 2018-12-31 DIAGNOSIS — E7800 Pure hypercholesterolemia, unspecified: Secondary | ICD-10-CM | POA: Diagnosis not present

## 2018-12-31 DIAGNOSIS — I10 Essential (primary) hypertension: Secondary | ICD-10-CM | POA: Diagnosis not present

## 2018-12-31 DIAGNOSIS — G2581 Restless legs syndrome: Secondary | ICD-10-CM | POA: Diagnosis not present

## 2018-12-31 DIAGNOSIS — R7301 Impaired fasting glucose: Secondary | ICD-10-CM | POA: Diagnosis not present

## 2019-01-19 ENCOUNTER — Encounter (INDEPENDENT_AMBULATORY_CARE_PROVIDER_SITE_OTHER): Payer: Medicare Other | Admitting: Ophthalmology

## 2019-01-25 ENCOUNTER — Encounter (INDEPENDENT_AMBULATORY_CARE_PROVIDER_SITE_OTHER): Payer: Medicare Other | Admitting: Ophthalmology

## 2019-01-28 NOTE — Progress Notes (Signed)
[facility] Note  02/01/2019     CHIEF COMPLAINT Patient presents for Retina Follow Up   HISTORY OF PRESENT ILLNESS: Martha Richard is a 85 y.o. female who presents to the clinic today for:   HPI    Retina Follow Up    Patient presents with  CRVO/BRVO.  In left eye.  This started weeks ago.  Severity is moderate.  Duration of weeks.  Since onset it is stable.  I, the attending physician,  performed the HPI with the patient and updated documentation appropriately.          Comments    Pt states her vision is about the same.  She denies eye pain or discomfort and denies any new or worsening floaters or fol OU.       Last edited by Bernarda Caffey, MD on 02/01/2019  3:57 PM. (History)    pt is delayed to follow up bc her husband was sick last time she was supposed to come in, she states her vision is the same  Referring physician: Hortencia Pilar, MD [place],  [place] [phone]  HISTORICAL INFORMATION:   Selected notes from the Power Referred by Dr. Read Drivers for concern of cystic IRF OS    CURRENT MEDICATIONS: No current outpatient medications on file. (Ophthalmic Drugs)   No current facility-administered medications for this visit.  (Ophthalmic Drugs)   Current Outpatient Medications (Other)  Medication Sig  . hydrochlorothiazide (HYDRODIURIL) 12.5 MG tablet Take 12.5 mg by mouth daily as needed (high blood pressure).   Marland Kitchen HYDROcodone-acetaminophen (NORCO/VICODIN) 5-325 MG tablet Take 1-2 tablets by mouth every 6 (six) hours as needed for moderate pain (pain score 4-6).  Marland Kitchen methocarbamol (ROBAXIN) 500 MG tablet Take 1 tablet (500 mg total) by mouth every 6 (six) hours as needed for muscle spasms.  . rivaroxaban (XARELTO) 10 MG TABS tablet Take 1 tablet (10 mg total) by mouth daily with breakfast. Then take one 81 mg aspirin once a day for three weeks. Then discontinue aspirin.  Marland Kitchen rOPINIRole (REQUIP) 0.25  MG tablet Take 0.25 mg by mouth at bedtime.   Current Facility-Administered Medications (Other)  Medication Route  . Bevacizumab (AVASTIN) SOLN 1.25 mg Intravitreal  . Bevacizumab (AVASTIN) SOLN 1.25 mg Intravitreal  . Bevacizumab (AVASTIN) SOLN 1.25 mg Intravitreal  . Bevacizumab (AVASTIN) SOLN 1.25 mg Intravitreal  . Bevacizumab (AVASTIN) SOLN 1.25 mg Intravitreal  . Bevacizumab (AVASTIN) SOLN 1.25 mg Intravitreal      REVIEW OF SYSTEMS: ROS    Positive for: Musculoskeletal, Eyes   Negative for: Constitutional, Gastrointestinal, Neurological, Skin, Genitourinary, HENT, Endocrine, Cardiovascular, Respiratory, Psychiatric, Allergic/Imm, Heme/Lymph   Last edited by Doneen Poisson on 02/01/2019  2:26 PM. (History)       ALLERGIES Allergies  Allergen Reactions  . Ace Inhibitors Cough  . Caffeine Other (See Comments)    "shoots my BP up"  . Nsaids Other (See Comments)    GI upset    . Peanut-Containing Drug Products Other (See Comments)    headaches  . Ultram [Tramadol Hcl] Other (See Comments)    Headaches. Patient is unsure of this.  . Penicillins Itching and Rash    Has patient had a PCN reaction causing immediate rash, facial/tongue/throat swelling, SOB or lightheadedness with hypotension: No Has patient had a PCN reaction causing severe rash involving mucus membranes or skin necrosis: No Has patient had a PCN reaction that required hospitalization No  Has patient had a PCN reaction occurring within the last 10 years: No If all of the above answers are "NO", then may proceed with Cephalosporin use.     PAST MEDICAL HISTORY Past Medical History:  Diagnosis Date  . Arrhythmia    palpitations  . Arthritis   . Cancer (HCC)    squamous cell Left hand  . Complication of anesthesia    slow to wake up  . Headache    hx of migraines  . Hypertension   . Hypertensive retinopathy    OU   Past Surgical History:  Procedure Laterality Date  . APPENDECTOMY    .  CATARACT EXTRACTION Bilateral    OU about 10 yrs ago  . EYE SURGERY    . fibroadenoma left breast removal    . OTHER SURGICAL HISTORY     appendectomy  . TOTAL HIP ARTHROPLASTY Left 06/18/2016   Procedure: LEFT TOTAL HIP ARTHROPLASTY ANTERIOR APPROACH;  Surgeon: Gaynelle Arabian, MD;  Location: [facility];  Service: Orthopedics;  Laterality: Left;  . TOTAL HIP ARTHROPLASTY Right 05/05/2018   Procedure: TOTAL HIP ARTHROPLASTY ANTERIOR APPROACH;  Surgeon: Gaynelle Arabian, MD;  Location: [facility];  Service: Orthopedics;  Laterality: Right;  . UTERINE FIBROID SURGERY      FAMILY HISTORY Family History  Problem Relation Age of Onset  . Heart disease Mother   . Heart attack Mother   . Heart failure Mother   . Stroke Father   . Heart attack Maternal Aunt   . Heart disease Maternal Aunt   . Heart failure Maternal Aunt   . Amblyopia Neg Hx   . Blindness Neg Hx   . Cataracts Neg Hx   . Glaucoma Neg Hx   . Macular degeneration Neg Hx   . Retinal detachment Neg Hx   . Strabismus Neg Hx   . Retinitis pigmentosa Neg Hx     SOCIAL HISTORY Social History   Tobacco Use  . Smoking status: Former Research scientist (life sciences)  . Smokeless tobacco: Never Used  . Tobacco comment: years ago 50 plus years  Substance Use Topics  . Alcohol use: No  . Drug use: No         OPHTHALMIC EXAM:  Base Eye Exam    Visual Acuity (Snellen - Linear)      Right Left   Dist cc 20/20 20/40 -2   Dist ph cc  20/40 +1   Correction: Glasses       Tonometry (Tonopen, 2:28 PM)      Right Left   Pressure 17 20       Pupils      Dark Light Shape React APD   Right 2 1 Round Minimal 0   Left 2 1 Round Minimal 0       Visual Fields      Left Right    Full Full       Extraocular Movement      Right Left    Full Full       Neuro/Psych    Oriented x3: Yes   Mood/Affect: Normal       Dilation    Both eyes: 1.0% Mydriacyl, 2.5% Phenylephrine @ 2:28 PM        Slit Lamp and Fundus Exam    Slit Lamp Exam      Right  Left   Lids/Lashes Dermatochalasis - upper lid, Meibomian gland dysfunction, Scurf Dermatochalasis - upper lid, Meibomian gland dysfunction   Conjunctiva/Sclera White and quiet White and  quiet   Cornea Arcus,  2+ Punctate epithelial erosions, Debris in tear film, Well healed cataract wounds, nasal and temporal LRI Mild arcus, 2+ Punctate epithelial erosions, nasal and temporal LRI, Debris in tear film   Anterior Chamber Deep and quiet Deep and quiet   Iris Round and moderately dilated Round and moderately dilated    Lens PC IOL in good position with open PC PC IOL in good position, open PC   Vitreous Vitreous syneresis Vitreous syneresis, Posterior vitreous detachment       Fundus Exam      Right Left   Disc Disc heme and focal edema at 0430, Pink and Sharp Tilted, +collateral vessels temporally, vascular loops, inferior rim thinning, Pink and Sharp, +cupping   C/D Ratio 0.2 0.65   Macula Blunted foveal reflex, Retinal pigment epithelial mottling, trace cystic changes, No heme or edema good foveal reflex; central IRF and fine exudate superior macula - persistent   Vessels Inferior vessels 3+tortuosity, Vascular attenuation, Impending BRVO inferiorly, dilated venuels (inferior>superior) Vascular attenuation, Tortuous, AV crossing changes, dilated venules    Periphery Attached, scattered MAs Attached, scattered MAs        Refraction    Wearing Rx      Sphere Cylinder Axis Add   Right +1.50 +1.50 014 +2.50   Left +2.00 +1.00 178 +2.50          IMAGING AND PROCEDURES  Imaging and Procedures for 05/25/17  OCT, Retina - OU - Both Eyes       Right Eye Quality was good. Central Foveal Thickness: 268. Progression has improved. Findings include normal foveal contour, no SRF, epiretinal membrane, intraretinal hyper-reflective material, no IRF (Interval improvement in IRF -- just trace cystic changes).   Left Eye Quality was good. Central Foveal Thickness: 255. Progression has improved.  Findings include epiretinal membrane, no SRF, intraretinal fluid, intraretinal hyper-reflective material, normal foveal contour (Persistent cystic changes and IRHM).   Notes *Images captured and stored on drive  Diagnosis / Impression:  OD: Interval improvement in IRF -- just trace cystic changes [id number] cystic changes and IRHM   Clinical management:  See below  Abbreviations: NFP - Normal foveal profile. CME - cystoid macular edema. PED - pigment epithelial detachment. IRF - intraretinal fluid. SRF - subretinal fluid. EZ - ellipsoid zone. ERM - epiretinal membrane. ORA - outer retinal atrophy. ORT - outer retinal tubulation. SRHM - subretinal hyper-reflective material         Intravitreal Injection, Pharmacologic Agent - OS - Left Eye       Time Out 02/01/2019. 3:00 PM. Confirmed correct patient, procedure, site, and patient consented.   Anesthesia Topical anesthesia was used. Anesthetic medications included Lidocaine 2%, Proparacaine 0.5%.   Procedure Preparation included 5% betadine to ocular surface, eyelid speculum. A supplied needle was used.   Injection:  1.25 mg Bevacizumab (AVASTIN) SOLN   NDC: [id number], Lot: [id number] , Expiration date: 03/02/2019   Route: Intravitreal, Site: Left Eye, Waste: 0 mL  Post-op Post injection exam found visual acuity of at least counting fingers. The patient tolerated the procedure well. There were no complications. The patient received written and verbal post procedure care education.                 ASSESSMENT/PLAN:    ICD-10-CM   1. Branch retinal vein occlusion of left eye with macular edema  H34.8320 Intravitreal Injection, Pharmacologic Agent - OS - Left Eye    Bevacizumab (AVASTIN) SOLN 1.25 mg  2. Retinal edema  H35.81 OCT, Retina - OU - Both Eyes  3. Essential hypertension  I10   4. Hypertensive retinopathy of both eyes  H35.033   5. Posterior vitreous detachment of left eye  H43.812   6. Glaucoma suspect  of left eye  H40.002   7. Pseudophakia of both eyes  Z96.1   8. PCO (posterior capsular opacification), left  H26.492     1,2. BRVO w/ macular edema OS  - delayed follow up from 4-5 weeks - 6+  - on presenting exam, superior temporal venule was dilated and tortuous but no significant intraretinal hemorrhages suggesting perhaps a remote BRVO event had occurred  - FA at presentation (01.23.19) shows significant leakage from branches from the superior temporal venules corresponding to central cystic changes noted on OCT  - S/P IVA OS #1 (01.23.19), #2 (02.25.19), #3 (04.01.19), #4 (05.07.19), #5 (06.25.19), #6 (02.21.20), #7 (09.16.20), #8 (10.21.20)  - lost to follow up from February to Sept 2020  - OCT today shows persistent cystic changes and IRHM   - BCVA OS 20/40 from 20/30  - recommend IVA OS #9 today (12.08.20)  - RBA of procedure discussed, questions answered  - informed consent obtained and signed  - see procedure note  - F/U 4-5 weeks -- DFE/OCT/possible injection  3,4. Hypertensive retinopathy OU  - discussed importance of tight BP control  - monitor  5. PVD OS  - Discussed findings and prognosis  - No RT or RD on 360 peripheral exam  - Reviewed s/s of RT/RD  - Strict return precautions for any such RT/RD signs/symptoms  6. Glaucoma Suspect OS  - under the expert management of Dr. Kathlen Mody  7,8. Pseudophakia OU  - s/p CE/IOL OU 11 yrs ago at [facility]  - s/p YAG capsulotomy OU  - OS re-YAG'd by Dr. Kathlen Mody -- PC opening improved  - monitor   Ophthalmic Meds Ordered this visit:  Meds ordered this encounter  Medications  . Bevacizumab (AVASTIN) SOLN 1.25 mg      Return for f/u 4-5 weeks, BRVO OS, DFE, OCT.  There are no Patient Instructions on file for this visit.   Explained the diagnoses, plan, and follow up with the patient and they expressed understanding.  Patient expressed understanding of the importance of proper follow up care.   This document  serves as a record of services personally performed by Gardiner Sleeper, MD, PhD. It was created on their behalf by Estill Bakes, COT an ophthalmic technician. The creation of this record is the provider's dictation and/or activities during the visit.    Electronically signed by: Estill Bakes, COT 01/28/19 @ 11:26 PM  Gardiner Sleeper, M.D., Ph.D. Diseases & Surgery of the Retina and [place] 02/01/2019   I have reviewed the above documentation for accuracy and completeness, and I agree with the above. Gardiner Sleeper, M.D., Ph.D. 02/01/19 11:26 PM    Abbreviations: M myopia (nearsighted); A astigmatism; H hyperopia (farsighted); P presbyopia; Mrx spectacle prescription;  CTL contact lenses; OD right eye; OS left eye; OU both eyes  XT exotropia; ET esotropia; PEK punctate epithelial keratitis; PEE punctate epithelial erosions; DES dry eye syndrome; MGD meibomian gland dysfunction; ATs artificial tears; PFAT's preservative free artificial tears; [place] nuclear sclerotic cataract; PSC posterior subcapsular cataract; ERM epi-retinal membrane; PVD posterior vitreous detachment; RD retinal detachment; DM diabetes mellitus; DR diabetic retinopathy; NPDR non-proliferative diabetic retinopathy; PDR proliferative diabetic retinopathy; CSME clinically significant macular edema; DME diabetic macular  edema; dbh dot blot hemorrhages; CWS cotton wool spot; POAG primary open angle glaucoma; C/D cup-to-disc ratio; HVF humphrey visual field; GVF goldmann visual field; OCT optical coherence tomography; IOP intraocular pressure; BRVO Branch retinal vein occlusion; CRVO central retinal vein occlusion; CRAO central retinal artery occlusion; BRAO branch retinal artery occlusion; RT retinal tear; SB scleral buckle; PPV pars plana vitrectomy; VH Vitreous hemorrhage; PRP panretinal laser photocoagulation; IVK intravitreal kenalog; VMT vitreomacular traction; MH Macular hole;  NVD  neovascularization of the disc; NVE neovascularization elsewhere; AREDS age related eye disease study; ARMD age related macular degeneration; POAG primary open angle glaucoma; EBMD epithelial/anterior basement membrane dystrophy; ACIOL anterior chamber intraocular lens; IOL intraocular lens; PCIOL posterior chamber intraocular lens; Phaco/IOL phacoemulsification with intraocular lens placement; [place] photorefractive keratectomy; LASIK laser assisted in situ keratomileusis; HTN hypertension; DM diabetes mellitus; COPD chronic obstructive pulmonary disease

## 2019-02-01 ENCOUNTER — Encounter (INDEPENDENT_AMBULATORY_CARE_PROVIDER_SITE_OTHER): Payer: Self-pay | Admitting: Ophthalmology

## 2019-02-01 ENCOUNTER — Ambulatory Visit (INDEPENDENT_AMBULATORY_CARE_PROVIDER_SITE_OTHER): Payer: Medicare Other | Admitting: Ophthalmology

## 2019-02-01 DIAGNOSIS — H34832 Tributary (branch) retinal vein occlusion, left eye, with macular edema: Secondary | ICD-10-CM

## 2019-02-01 DIAGNOSIS — I10 Essential (primary) hypertension: Secondary | ICD-10-CM | POA: Diagnosis not present

## 2019-02-01 DIAGNOSIS — H3581 Retinal edema: Secondary | ICD-10-CM

## 2019-02-01 DIAGNOSIS — H40002 Preglaucoma, unspecified, left eye: Secondary | ICD-10-CM

## 2019-02-01 DIAGNOSIS — Z961 Presence of intraocular lens: Secondary | ICD-10-CM

## 2019-02-01 DIAGNOSIS — H26492 Other secondary cataract, left eye: Secondary | ICD-10-CM

## 2019-02-01 DIAGNOSIS — H43812 Vitreous degeneration, left eye: Secondary | ICD-10-CM | POA: Diagnosis not present

## 2019-02-01 DIAGNOSIS — H35033 Hypertensive retinopathy, bilateral: Secondary | ICD-10-CM | POA: Diagnosis not present

## 2019-02-01 MED ORDER — BEVACIZUMAB CHEMO INJECTION 1.25MG/0.05ML SYRINGE FOR KALEIDOSCOPE
1.2500 mg | INTRAVITREAL | Status: AC | PRN
  Administered 2019-02-01: 1.25 mg via INTRAVITREAL

## 2019-03-08 ENCOUNTER — Encounter (INDEPENDENT_AMBULATORY_CARE_PROVIDER_SITE_OTHER): Payer: Medicare Other | Admitting: Ophthalmology

## 2019-03-16 NOTE — Progress Notes (Signed)
[facility] Note  03/18/2019     CHIEF COMPLAINT Patient presents for Retina Follow Up   HISTORY OF PRESENT ILLNESS: Martha Richard is a 86 y.o. female who presents to the clinic today for:   HPI    Retina Follow Up    Patient presents with  CRVO/BRVO.  In left eye.  This started months ago.  Severity is moderate.  Duration of 6 weeks.  Since onset it is stable.  I, the attending physician,  performed the HPI with the patient and updated documentation appropriately.          Comments    86 y/o female pt here for 6 wk f/u for BRVO w/mac edema OS.  No change in [place] OU.  Denies pain, flashes, new floaters.  AT prn OU.       Last edited by Bernarda Caffey, MD on 03/18/2019  1:21 PM. (History)    Patient is late to f/u 6 weeks instead of 4-5 weeks due to husband's health. Patient states no change in vision OU. Patient states BP fluctuates. Still taking meds from blood pressure.  Referring physician: Manon Hilding, MD [address],  [place] [address]  HISTORICAL INFORMATION:   Selected notes from the MEDICAL RECORD NUMBER Referred by Dr. Read Drivers for concern of cystic IRF OS    CURRENT MEDICATIONS: No current outpatient medications on file. (Ophthalmic Drugs)   No current facility-administered medications for this visit. (Ophthalmic Drugs)   Current Outpatient Medications (Other)  Medication Sig  . hydrochlorothiazide (HYDRODIURIL) 12.5 MG tablet Take 12.5 mg by mouth daily as needed (high blood pressure).   Marland Kitchen HYDROcodone-acetaminophen (NORCO/VICODIN) 5-325 MG tablet Take 1-2 tablets by mouth every 6 (six) hours as needed for moderate pain (pain score 4-6).  Marland Kitchen methocarbamol (ROBAXIN) 500 MG tablet Take 1 tablet (500 mg total) by mouth every 6 (six) hours as needed for muscle spasms.  . rivaroxaban (XARELTO) 10 MG TABS tablet Take 1 tablet (10 mg total) by mouth daily with breakfast. Then take one 81 mg aspirin once a day for three weeks. Then  discontinue aspirin.  Marland Kitchen rOPINIRole (REQUIP) 0.25 MG tablet Take 0.25 mg by mouth at bedtime.   Current Facility-Administered Medications (Other)  Medication Route  . Bevacizumab (AVASTIN) SOLN 1.25 mg Intravitreal  . Bevacizumab (AVASTIN) SOLN 1.25 mg Intravitreal  . Bevacizumab (AVASTIN) SOLN 1.25 mg Intravitreal  . Bevacizumab (AVASTIN) SOLN 1.25 mg Intravitreal  . Bevacizumab (AVASTIN) SOLN 1.25 mg Intravitreal  . Bevacizumab (AVASTIN) SOLN 1.25 mg Intravitreal      REVIEW OF SYSTEMS: ROS    Positive for: Musculoskeletal, Cardiovascular, Eyes   Negative for: Constitutional, Gastrointestinal, Neurological, Skin, Genitourinary, HENT, Endocrine, Respiratory, Psychiatric, Allergic/Imm, Heme/Lymph   Last edited by Matthew Folks, COA on 03/18/2019  1:06 PM. (History)       ALLERGIES Allergies  Allergen Reactions  . Ace Inhibitors Cough  . Caffeine Other (See Comments)    "shoots my BP up"  . Nsaids Other (See Comments)    GI upset    . Peanut-Containing Drug Products Other (See Comments)    headaches  . Ultram [Tramadol Hcl] Other (See Comments)    Headaches. Patient is unsure of this.  . Penicillins Itching and Rash    Has patient had a PCN reaction causing immediate rash, facial/tongue/throat swelling, SOB or lightheadedness with hypotension: No Has patient had a PCN reaction causing severe rash involving mucus membranes or skin necrosis:  No Has patient had a PCN reaction that required hospitalization No Has patient had a PCN reaction occurring within the last 10 years: No If all of the above answers are "NO", then may proceed with Cephalosporin use.     PAST MEDICAL HISTORY Past Medical History:  Diagnosis Date  . Arrhythmia    palpitations  . Arthritis   . Cancer (HCC)    squamous cell Left hand  . Complication of anesthesia    slow to wake up  . Headache    hx of migraines  . Hypertension   . Hypertensive retinopathy    OU   Past Surgical History:   Procedure Laterality Date  . APPENDECTOMY    . CATARACT EXTRACTION Bilateral    OU about 10 yrs ago  . EYE SURGERY    . fibroadenoma left breast removal    . OTHER SURGICAL HISTORY     appendectomy  . TOTAL HIP ARTHROPLASTY Left 06/18/2016   Procedure: LEFT TOTAL HIP ARTHROPLASTY ANTERIOR APPROACH;  Surgeon: Gaynelle Arabian, MD;  Location: [facility];  Service: Orthopedics;  Laterality: Left;  . TOTAL HIP ARTHROPLASTY Right 05/05/2018   Procedure: TOTAL HIP ARTHROPLASTY ANTERIOR APPROACH;  Surgeon: Gaynelle Arabian, MD;  Location: [facility];  Service: Orthopedics;  Laterality: Right;  . UTERINE FIBROID SURGERY      FAMILY HISTORY Family History  Problem Relation Age of Onset  . Heart disease Mother   . Heart attack Mother   . Heart failure Mother   . Stroke Father   . Heart attack Maternal Aunt   . Heart disease Maternal Aunt   . Heart failure Maternal Aunt   . Amblyopia Neg Hx   . Blindness Neg Hx   . Cataracts Neg Hx   . Glaucoma Neg Hx   . Macular degeneration Neg Hx   . Retinal detachment Neg Hx   . Strabismus Neg Hx   . Retinitis pigmentosa Neg Hx     SOCIAL HISTORY Social History   Tobacco Use  . Smoking status: Former Research scientist (life sciences)  . Smokeless tobacco: Never Used  . Tobacco comment: years ago 50 plus years  Substance Use Topics  . Alcohol use: No  . Drug use: No         OPHTHALMIC EXAM:  Base Eye Exam    Visual Acuity (Snellen - Linear)      Right Left   Dist cc 20/20 20/40 -2   Dist ph cc  NI   Correction: Glasses       Tonometry (Tonopen, 1:08 PM)      Right Left   Pressure 12 16       Pupils      Dark Light Shape React APD   Right 3 2 Round Brisk None   Left 3 2 Round Brisk None       Visual Fields (Counting fingers)      Left Right    Full Full       Extraocular Movement      Right Left    Full, Ortho Full, Ortho       Neuro/Psych    Oriented x3: Yes   Mood/Affect: Normal       Dilation    Both eyes: 1.0% Mydriacyl, 2.5% Phenylephrine @  1:08 PM        Slit Lamp and Fundus Exam    Slit Lamp Exam      Right Left   Lids/Lashes Dermatochalasis - upper lid, Meibomian gland dysfunction, Scurf Dermatochalasis -  upper lid, Meibomian gland dysfunction   Conjunctiva/Sclera White and quiet White and quiet   Cornea Arcus,  2+ Punctate epithelial erosions, Debris in tear film, Well healed cataract wounds, nasal and temporal LRI Mild arcus, 2+ Punctate epithelial erosions, nasal and temporal LRI, Debris in tear film   Anterior Chamber Deep and quiet Deep and quiet   Iris Round and moderately dilated Round and moderately dilated    Lens PC IOL in good position with open PC PC IOL in good position, open PC   Vitreous Vitreous syneresis Vitreous syneresis, Posterior vitreous detachment       Fundus Exam      Right Left   Disc Disc heme and focal edema at 0430, Pink and Sharp Tilted, +collateral vessels temporally, vascular loops, inferior rim thinning, Pink and Sharp, +cupping   C/D Ratio 0.2 0.65   Macula Blunted foveal reflex, Retinal pigment epithelial mottling, trace cystic changes, No heme or edema good foveal reflex; central IRF--pesistent and fine exudate superior macula - improved   Vessels Inferior vessels 3+tortuosity, Vascular attenuation, Impending BRVO inferiorly, dilated venuels (inferior>superior) Vascular attenuation, Tortuous, AV crossing changes, dilated venules    Periphery Attached, scattered MAs Attached, scattered MAs          IMAGING AND PROCEDURES  Imaging and Procedures for 05/25/17  OCT, Retina - OU - Both Eyes       Right Eye Quality was good. Central Foveal Thickness: 271. Progression has been stable. Findings include normal foveal contour, no SRF, epiretinal membrane, intraretinal hyper-reflective material, no IRF (stable improvement in IRF -- just trace cystic changes).   Left Eye Quality was good. Central Foveal Thickness: 260. Progression has been stable. Findings include epiretinal membrane, no  SRF, intraretinal fluid, intraretinal hyper-reflective material, normal foveal contour (Persistent cystic changes temporal macula and IRHM).   Notes *Images captured and stored on drive  Diagnosis / Impression:  OD: stable improvement in IRF -- just trace cystic changes [id number] cystic changes temporal macula and IRHM   Clinical management:  See below  Abbreviations: NFP - Normal foveal profile. CME - cystoid macular edema. PED - pigment epithelial detachment. IRF - intraretinal fluid. SRF - subretinal fluid. EZ - ellipsoid zone. ERM - epiretinal membrane. ORA - outer retinal atrophy. ORT - outer retinal tubulation. SRHM - subretinal hyper-reflective material         Intravitreal Injection, Pharmacologic Agent - OS - Left Eye       Time Out 03/18/2019. 1:23 PM. Confirmed correct patient, procedure, site, and patient consented.   Anesthesia Topical anesthesia was used. Anesthetic medications included Lidocaine 2%, Proparacaine 0.5%.   Procedure Preparation included 5% betadine to ocular surface, eyelid speculum. A supplied needle was used.   Injection:  1.25 mg Bevacizumab (AVASTIN) SOLN   NDC: 00349-179-15, Lot: [id number] , Expiration date: 05/11/2019   Route: Intravitreal, Site: Left Eye, Waste: 0 mL  Post-op Post injection exam found visual acuity of at least counting fingers. The patient tolerated the procedure well. There were no complications. The patient received written and verbal post procedure care education.                 ASSESSMENT/PLAN:    ICD-10-CM   1. Branch retinal vein occlusion of left eye with macular edema  H34.8320 Intravitreal Injection, Pharmacologic Agent - OS - Left Eye    Bevacizumab (AVASTIN) SOLN 1.25 mg  2. Retinal edema  H35.81 OCT, Retina - OU - Both Eyes  3. Essential hypertension  I10  4. Hypertensive retinopathy of both eyes  H35.033   5. Posterior vitreous detachment of left eye  H43.812   6. Glaucoma suspect of left  eye  H40.002   7. Pseudophakia of both eyes  Z96.1   8. PCO (posterior capsular opacification), left  H26.492     1,2. BRVO w/ macular edema OS  - delayed follow up from 4-5 weeks - 6+  - on presenting exam, superior temporal venule was dilated and tortuous but no significant intraretinal hemorrhages suggesting perhaps a remote BRVO event had occurred  - FA at presentation (01.23.19) shows significant leakage from branches from the superior temporal venules corresponding to central cystic changes noted on OCT  - S/P IVA OS #1 (01.23.19), #2 (02.25.19), #3 (04.01.19), #4 (05.07.19), #5 (06.25.19), #6 (02.21.20), #7 (09.16.20), #8 (10.21.20), #9 (12.08.20)  - lost to follow up from February to Sept 2020  - OCT today shows persistent cystic changes in temporal macula and IRHM   - BCVA OS stable at 20/40-2  - recommend IVA OS #10 today (01.22.21)  - RBA of procedure discussed, questions answered  - informed consent obtained and signed  - see procedure note  - F/U 4-5 weeks -- DFE/OCT/possible injection  3,4. Hypertensive retinopathy OU  - discussed importance of tight BP control  - monitor  5. PVD OS  - Discussed findings and prognosis  - No RT or RD on 360 peripheral exam  - Reviewed s/s of RT/RD  - Strict return precautions for any such RT/RD signs/symptoms  6. Glaucoma Suspect OS  - under the expert management of Dr. Kathlen Mody  7,8. Pseudophakia OU  - s/p CE/[facility]OL OU [facility] yrs ago at [facility]  - s/p YAG capsulotomy OU  - OS re-YAG'd by Dr. Kathlen Mody -- PC opening improved  - monitor   Ophthalmic Meds Ordered this visit:  Meds ordered this encounter  Medications  . Bevacizumab (AVASTIN) SOLN 1.25 mg      Return for 4-5 wks -- f/u CRVO OS -- Dilated Exam, OCT, Possible Injxn.  There are no Patient Instructions on file for this visit.   Explained the diagnoses, plan, and follow up with the patient and they expressed understanding.  Patient expressed understanding of the  importance of proper follow up care.   This document serves as a record of services personally performed by Gardiner Sleeper, MD, PhD. It was created on their behalf by Roselee Nova, COMT. The creation of this record is the provider's dictation and/or activities during the visit.  Electronically signed by: Roselee Nova, COMT 03/18/19 2:10 PM    Gardiner Sleeper, M.D., Ph.D. Diseases & Surgery of the Retina and Vitreous [facility]  I have reviewed the above documentation for accuracy and completeness, and I agree with the above. Gardiner Sleeper, M.D., Ph.D. 03/18/19 2:10 PM    Abbreviations: M myopia (nearsighted); A astigmatism; H hyperopia (farsighted); P presbyopia; Mrx spectacle prescription;  CTL contact lenses; OD right eye; OS left eye; OU both eyes  XT exotropia; ET esotropia; PEK punctate epithelial keratitis; PEE punctate epithelial erosions; DES dry eye syndrome; MGD meibomian gland dysfunction; ATs artificial tears; PFAT's preservative free artificial tears; [place] nuclear sclerotic cataract; PSC posterior subcapsular cataract; ERM epi-retinal membrane; PVD posterior vitreous detachment; RD retinal detachment; DM diabetes mellitus; DR diabetic retinopathy; NPDR non-proliferative diabetic retinopathy; PDR proliferative diabetic retinopathy; CSME clinically significant macular edema; DME diabetic macular edema; dbh dot blot hemorrhages; CWS cotton wool spot; POAG primary open  angle glaucoma; C/D cup-to-disc ratio; HVF humphrey visual field; GVF goldmann visual field; OCT optical coherence tomography; IOP intraocular pressure; BRVO Branch retinal vein occlusion; CRVO central retinal vein occlusion; CRAO central retinal artery occlusion; BRAO branch retinal artery occlusion; RT retinal tear; SB scleral buckle; PPV pars plana vitrectomy; VH Vitreous hemorrhage; PRP panretinal laser photocoagulation; IVK intravitreal kenalog; VMT vitreomacular traction; MH Macular hole;  NVD  neovascularization of the disc; NVE neovascularization elsewhere; AREDS age related eye disease study; ARMD age related macular degeneration; POAG primary open angle glaucoma; EBMD epithelial/anterior basement membrane dystrophy; ACIOL anterior chamber intraocular lens; IOL intraocular lens; PCIOL posterior chamber intraocular lens; Phaco/IOL phacoemulsification with intraocular lens placement; [place] photorefractive keratectomy; LASIK laser assisted in situ keratomileusis; HTN hypertension; DM diabetes mellitus; COPD chronic obstructive pulmonary disease

## 2019-03-18 ENCOUNTER — Ambulatory Visit (INDEPENDENT_AMBULATORY_CARE_PROVIDER_SITE_OTHER): Payer: Medicare Other | Admitting: Ophthalmology

## 2019-03-18 ENCOUNTER — Encounter (INDEPENDENT_AMBULATORY_CARE_PROVIDER_SITE_OTHER): Payer: Self-pay | Admitting: Ophthalmology

## 2019-03-18 DIAGNOSIS — H26492 Other secondary cataract, left eye: Secondary | ICD-10-CM

## 2019-03-18 DIAGNOSIS — H43812 Vitreous degeneration, left eye: Secondary | ICD-10-CM

## 2019-03-18 DIAGNOSIS — Z961 Presence of intraocular lens: Secondary | ICD-10-CM

## 2019-03-18 DIAGNOSIS — H3581 Retinal edema: Secondary | ICD-10-CM | POA: Diagnosis not present

## 2019-03-18 DIAGNOSIS — I10 Essential (primary) hypertension: Secondary | ICD-10-CM

## 2019-03-18 DIAGNOSIS — H34832 Tributary (branch) retinal vein occlusion, left eye, with macular edema: Secondary | ICD-10-CM

## 2019-03-18 DIAGNOSIS — H40002 Preglaucoma, unspecified, left eye: Secondary | ICD-10-CM

## 2019-03-18 DIAGNOSIS — H35033 Hypertensive retinopathy, bilateral: Secondary | ICD-10-CM

## 2019-03-18 MED ORDER — BEVACIZUMAB CHEMO INJECTION 1.25MG/0.05ML SYRINGE FOR KALEIDOSCOPE
1.2500 mg | INTRAVITREAL | Status: AC | PRN
  Administered 2019-03-18: 14:00:00 1.25 mg via INTRAVITREAL

## 2019-04-09 IMAGING — RF OPERATIVE RIGHT HIP WITH PELVIS
1 series · 2 of 2 positions shown · non-contrast
Comparison: None.

CLINICAL DATA: RIGHT hip arthroplasty

EXAM:
OPERATIVE RIGHT HIP (WITH PELVIS IF PERFORMED) 2 VIEWS
TECHNIQUE: Fluoroscopic spot image(s) were submitted for interpretation
post-operatively.

[Series 1: unknown protocol · 0.20mm/px · 2 of 2 slices shown]
[im 1/2]
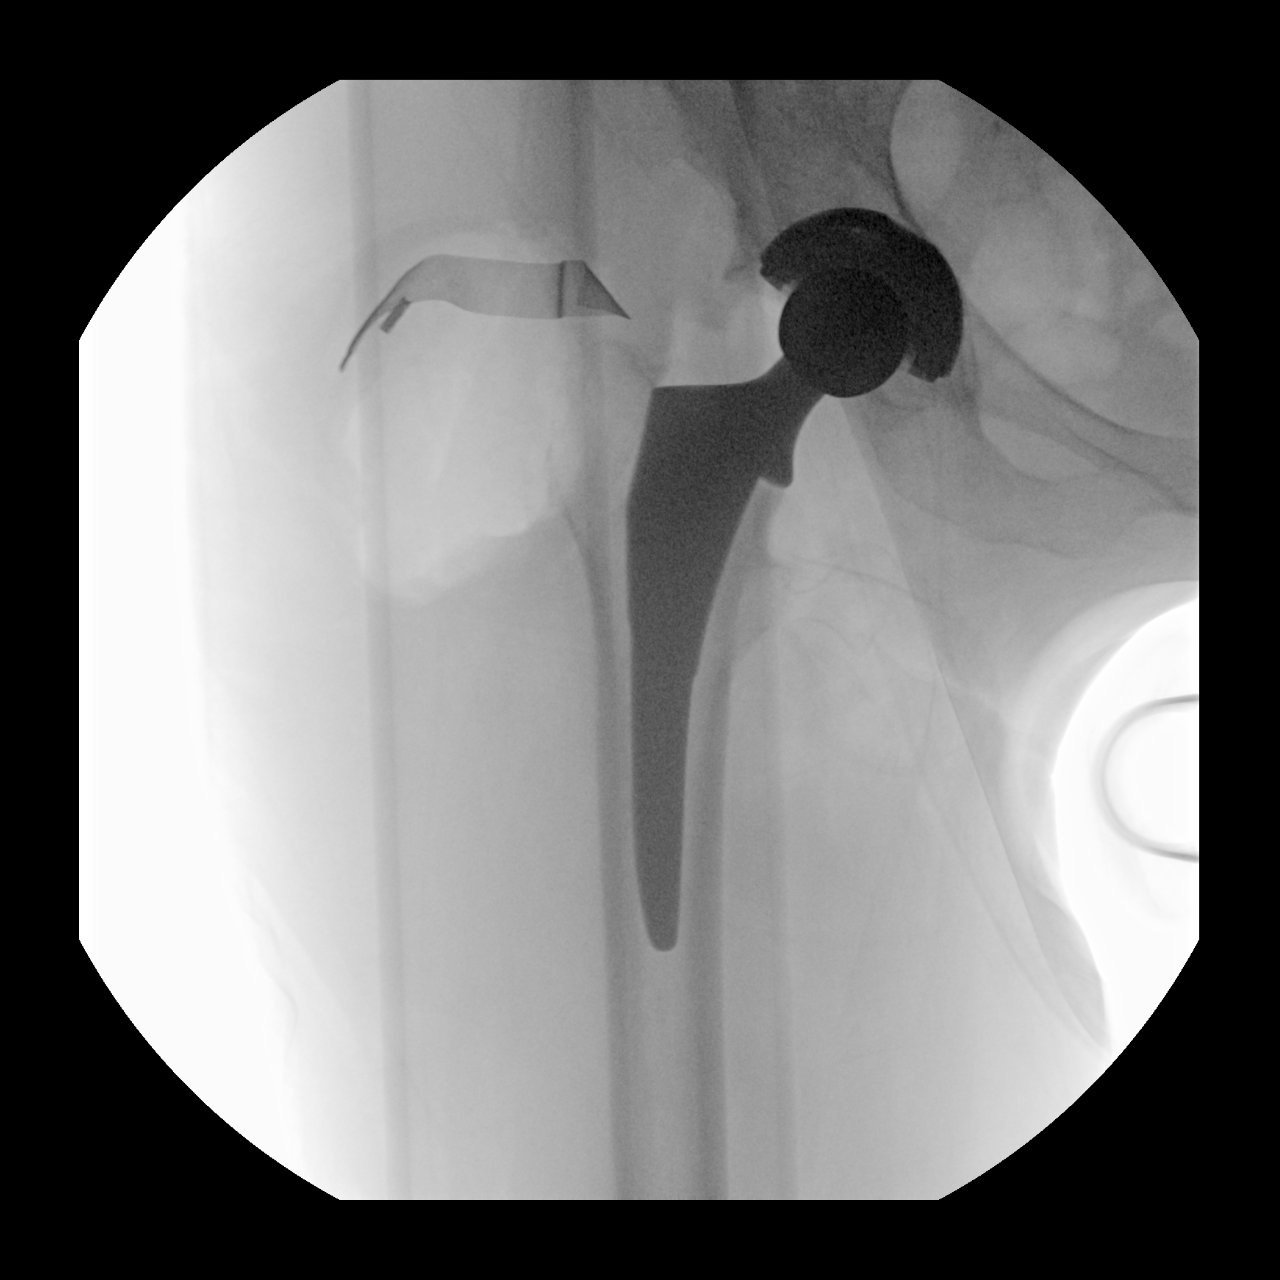
[im 2/2]
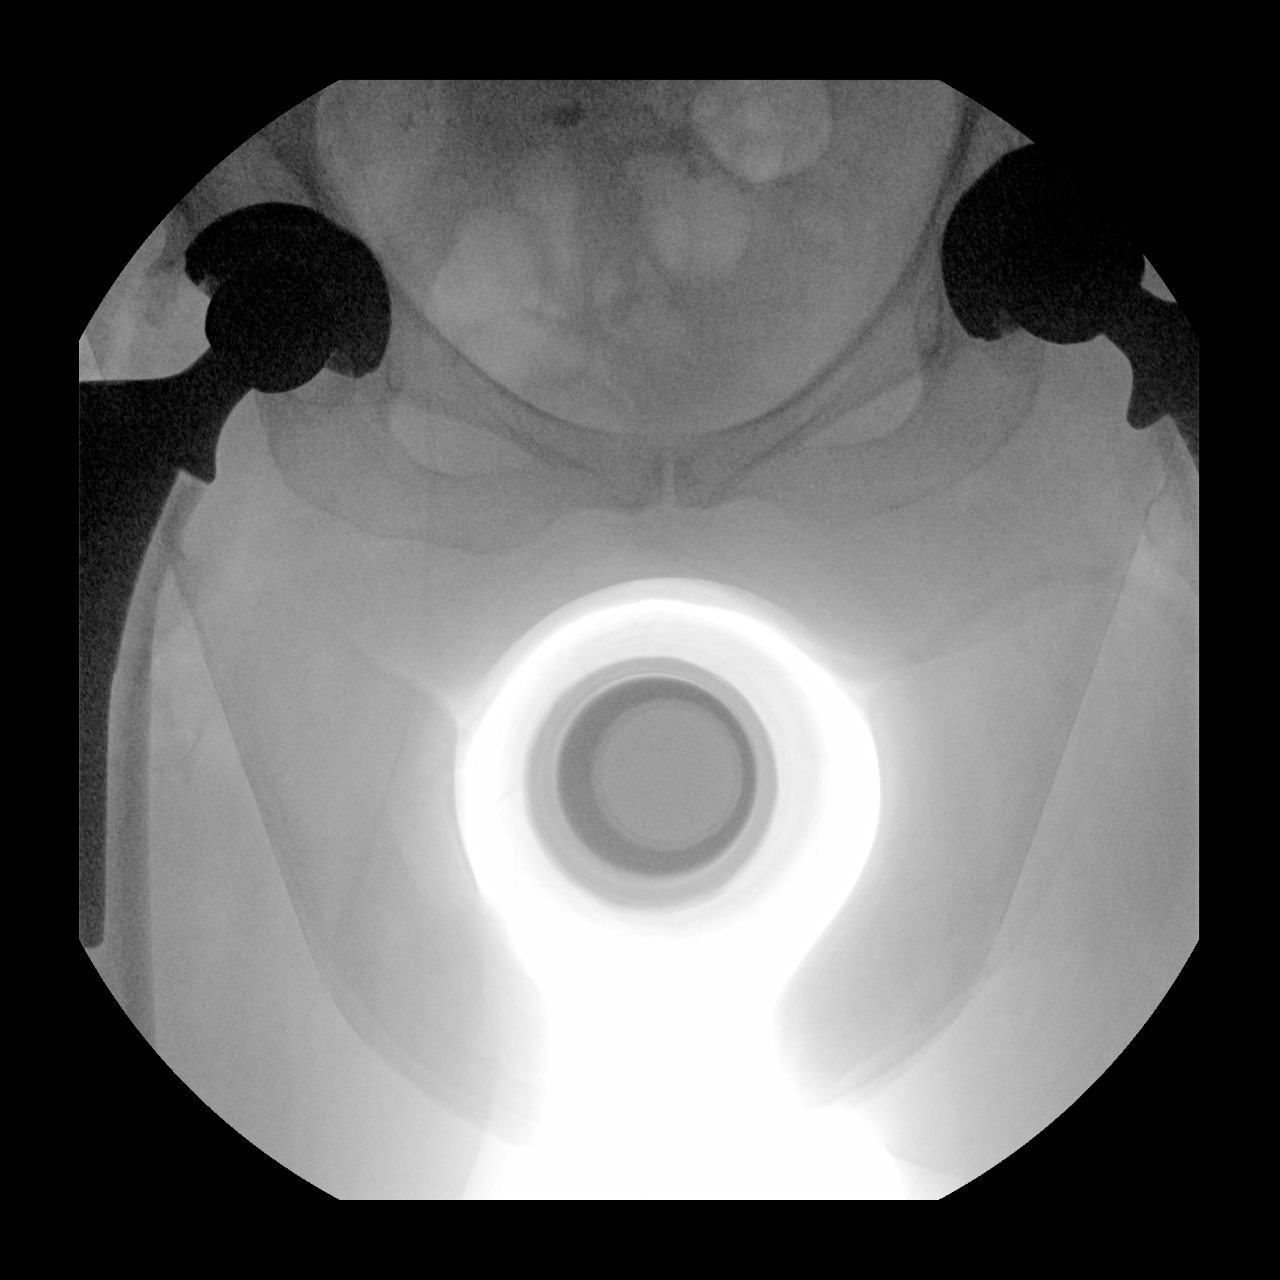

[2 of 2 positions shown; findings below may reference images not displayed]

FINDINGS: Total hip RIGHT arthroplasty.  No fracture or dislocation.

Ribbon like density adjacent to the greater trochanter.
IMPRESSION: 1. RIGHT hip total arthroplasty without complication.
2. Ribbon like density within the surgical field adjacent to the
RIGHT greater trochanter. Presumably intraoperative sponge marker.
Recommend correlation with postoperative films.

## 2019-04-20 NOTE — Progress Notes (Shared)
?  [facility] Note ? ?04/22/2019 ? ?  ? ?CHIEF COMPLAINT ?Patient presents for No chief complaint on file. ? ? ?HISTORY OF PRESENT ILLNESS: ?Martha Richard is a 86 y.o. female who presents to the clinic today for:  ? ?Patient is late to f/u 6 weeks instead of 4-5 weeks due to husband's health. Patient states no change in vision OU. Patient states BP fluctuates. Still taking meds from blood pressure. ? ?Referring physician: ?Sasser, Silvestre Moment, MD ?[address] ?[place],  [place] [address] ? ?HISTORICAL INFORMATION:  ? ?Selected notes from the [facility] ?Referred by Dr. Read Drivers for concern of cystic IRF OS ?  ? ?CURRENT MEDICATIONS: ?No current outpatient medications on file. (Ophthalmic Drugs)  ? ?No current facility-administered medications for this visit. (Ophthalmic Drugs)  ? ?Current Outpatient Medications (Other)  ?Medication Sig  ?? hydrochlorothiazide (HYDRODIURIL) 12.5 MG tablet Take 12.5 mg by mouth daily as needed (high blood pressure).   ?? HYDROcodone-acetaminophen (NORCO/VICODIN) 5-325 MG tablet Take 1-2 tablets by mouth every 6 (six) hours as needed for moderate pain (pain score 4-6).  ?? methocarbamol (ROBAXIN) 500 MG tablet Take 1 tablet (500 mg total) by mouth every 6 (six) hours as needed for muscle spasms.  ?? rivaroxaban (XARELTO) 10 MG TABS tablet Take 1 tablet (10 mg total) by mouth daily with breakfast. Then take one 81 mg aspirin once a day for three weeks. Then discontinue aspirin.  ?? rOPINIRole (REQUIP) 0.25 MG tablet Take 0.25 mg by mouth at bedtime.  ? ?Current Facility-Administered Medications (Other)  ?Medication Route  ?? Bevacizumab (AVASTIN) SOLN 1.25 mg Intravitreal  ?? Bevacizumab (AVASTIN) SOLN 1.25 mg Intravitreal  ?? Bevacizumab (AVASTIN) SOLN 1.25 mg Intravitreal  ?? Bevacizumab (AVASTIN) SOLN 1.25 mg Intravitreal  ?? Bevacizumab (AVASTIN) SOLN 1.25 mg Intravitreal  ?? Bevacizumab (AVASTIN) SOLN 1.25 mg Intravitreal  ? ? ? ? ?REVIEW OF SYSTEMS: ? ?  ? ?ALLERGIES ?Allergies  ?Allergen Reactions  ??

## 2019-04-22 ENCOUNTER — Encounter (INDEPENDENT_AMBULATORY_CARE_PROVIDER_SITE_OTHER): Payer: Self-pay

## 2019-04-22 ENCOUNTER — Encounter (INDEPENDENT_AMBULATORY_CARE_PROVIDER_SITE_OTHER): Payer: Medicare Other | Admitting: Ophthalmology

## 2019-05-17 ENCOUNTER — Telehealth: Payer: Self-pay | Admitting: Cardiovascular Disease

## 2019-05-17 NOTE — Telephone Encounter (Signed)
Virtual Visit Pre-Appointment Phone Call  "(Name), I am calling you today to discuss your upcoming appointment. We are currently trying to limit exposure to the virus that causes COVID-19 by seeing patients at home rather than in the office."  1. "What is the BEST phone number to call the day of the visit?" - include this in appointment notes  2. Do you have or have access to (through a family member/friend) a smartphone with video capability that we can use for your visit?" a. If yes - list this number in appt notes as cell (if different from BEST phone #) and list the appointment type as a VIDEO visit in appointment notes b. If no - list the appointment type as a PHONE visit in appointment notes  3. Confirm consent - "In the setting of the current Covid19 crisis, you are scheduled for a (phone or video) visit with your provider on (date) at (time).  Just as we do with many in-office visits, in order for you to participate in this visit, we must obtain consent.  If you'd like, I can send this to your mychart (if signed up) or email for you to review.  Otherwise, I can obtain your verbal consent now.  All virtual visits are billed to your insurance company just like a normal visit would be.  By agreeing to a virtual visit, we'd like you to understand that the technology does not allow for your provider to perform an examination, and thus may limit your provider's ability to fully assess your condition. If your provider identifies any concerns that need to be evaluated in person, we will make arrangements to do so.  Finally, though the technology is pretty good, we cannot assure that it will always work on either your or our end, and in the setting of a video visit, we may have to convert it to a phone-only visit.  In either situation, we cannot ensure that we have a secure connection.  Are you willing to proceed?" STAFF: Did the patient verbally acknowledge consent to telehealth visit? Document  YES/NO here: YES  4. Advise patient to be prepared - "Two hours prior to your appointment, go ahead and check your blood pressure, pulse, oxygen saturation, and your weight (if you have the equipment to check those) and write them all down. When your visit starts, your provider will ask you for this information. If you have an Apple Watch or Kardia device, please plan to have heart rate information ready on the day of your appointment. Please have a pen and paper handy nearby the day of the visit as well."  5. Give patient instructions for MyChart download to smartphone OR Doximity/Doxy.me as below if video visit (depending on what platform provider is using)  6. Inform patient they will receive a phone call 15 minutes prior to their appointment time (may be from unknown caller ID) so they should be prepared to answer    TELEPHONE CALL NOTE  Martha Richard has been deemed a candidate for a follow-up tele-health visit to limit community exposure during the Covid-19 pandemic. I spoke with the patient via phone to ensure availability of phone/video source, confirm preferred email & phone number, and discuss instructions and expectations.  I reminded Martha Richard to be prepared with any vital sign and/or heart rhythm information that could potentially be obtained via home monitoring, at the time of her visit. I reminded Martha Richard to expect a phone call prior to  her visit.  Weston Anna 05/17/2019 3:29 PM   INSTRUCTIONS FOR DOWNLOADING THE MYCHART APP TO SMARTPHONE  - The patient must first make sure to have activated MyChart and know their login information - If Apple, go to [employer] and type in MyChart in the search bar and download the app. If Android, ask patient to go to [employer] and type in [facility] in the search bar and download the app. The app is free but as with any other app downloads, their phone may require them to verify saved payment information or  Apple/Android password.  - The patient will need to then log into the app with their MyChart username and password, and select [facility] as their healthcare provider to link the account. When it is time for your visit, go to the MyChart app, find appointments, and click Begin Video Visit. Be sure to Select Allow for your device to access the Microphone and Camera for your visit. You will then be connected, and your provider will be with you shortly.  **If they have any issues connecting, or need assistance please contact MyChart service desk [phone])**  **If using a computer, in order to ensure the best quality for their visit they will need to use either of the following Internet Browsers: [employer], or Google Chrome**  IF USING DOXIMITY or DOXY.ME - The patient will receive a link just prior to their visit by text.     FULL LENGTH CONSENT FOR TELE-HEALTH VISIT   I hereby voluntarily request, consent and authorize [facility] and its employed or contracted physicians, physician assistants, nurse practitioners or other licensed health care professionals (the Practitioner), to provide me with telemedicine health care services (the Services") as deemed necessary by the treating Practitioner. I acknowledge and consent to receive the Services by the Practitioner via telemedicine. I understand that the telemedicine visit will involve communicating with the Practitioner through live audiovisual communication technology and the disclosure of certain medical information by electronic transmission. I acknowledge that I have been given the opportunity to request an in-person assessment or other available alternative prior to the telemedicine visit and am voluntarily participating in the telemedicine visit.  I understand that I have the right to withhold or withdraw my consent to the use of telemedicine in the course of my care at any time, without affecting my right to future care  or treatment, and that the Practitioner or I may terminate the telemedicine visit at any time. I understand that I have the right to inspect all information obtained and/or recorded in the course of the telemedicine visit and may receive copies of available information for a reasonable fee.  I understand that some of the potential risks of receiving the Services via telemedicine include:   Delay or interruption in medical evaluation due to technological equipment failure or disruption;  Information transmitted may not be sufficient (e.g. poor resolution of images) to allow for appropriate medical decision making by the Practitioner; and/or   In rare instances, security protocols could fail, causing a breach of personal health information.  Furthermore, I acknowledge that it is my responsibility to provide information about my medical history, conditions and care that is complete and accurate to the best of my ability. I acknowledge that Practitioner's advice, recommendations, and/or decision may be based on factors not within their control, such as incomplete or inaccurate data provided by me or distortions of diagnostic images or specimens that may result from electronic transmissions. I  understand that the practice of medicine is not an exact science and that Practitioner makes no warranties or guarantees regarding treatment outcomes. I acknowledge that I will receive a copy of this consent concurrently upon execution via email to the email address I last provided but may also request a printed copy by calling the office of [facility].    I understand that my insurance will be billed for this visit.   I have read or had this consent read to me.  I understand the contents of this consent, which adequately explains the benefits and risks of the Services being provided via telemedicine.   I have been provided ample opportunity to ask questions regarding this consent and the Services and have had  my questions answered to my satisfaction.  I give my informed consent for the services to be provided through the use of telemedicine in my medical care  By participating in this telemedicine visit I agree to the above.

## 2019-05-20 ENCOUNTER — Telehealth (INDEPENDENT_AMBULATORY_CARE_PROVIDER_SITE_OTHER): Payer: Medicare Other | Admitting: Cardiovascular Disease

## 2019-05-20 ENCOUNTER — Encounter: Payer: Self-pay | Admitting: Cardiovascular Disease

## 2019-05-20 VITALS — BP 122/72 | HR 57 | Ht 63.0 in | Wt 142.0 lb

## 2019-05-20 DIAGNOSIS — I350 Nonrheumatic aortic (valve) stenosis: Secondary | ICD-10-CM | POA: Diagnosis not present

## 2019-05-20 DIAGNOSIS — I447 Left bundle-branch block, unspecified: Secondary | ICD-10-CM | POA: Diagnosis not present

## 2019-05-20 DIAGNOSIS — I340 Nonrheumatic mitral (valve) insufficiency: Secondary | ICD-10-CM | POA: Diagnosis not present

## 2019-05-20 DIAGNOSIS — I10 Essential (primary) hypertension: Secondary | ICD-10-CM | POA: Diagnosis not present

## 2019-05-20 DIAGNOSIS — I351 Nonrheumatic aortic (valve) insufficiency: Secondary | ICD-10-CM

## 2019-05-20 DIAGNOSIS — Z87891 Personal history of nicotine dependence: Secondary | ICD-10-CM

## 2019-05-20 DIAGNOSIS — Z7189 Other specified counseling: Secondary | ICD-10-CM

## 2019-05-20 NOTE — Progress Notes (Signed)
Virtual Visit via Telephone Note   This visit type was conducted due to national recommendations for restrictions regarding the COVID-19 Pandemic (e.g. social distancing) in an effort to limit this patient's exposure and mitigate transmission in our community.  Due to her co-morbid illnesses, this patient is at least at moderate risk for complications without adequate follow up.  This format is felt to be most appropriate for this patient at this time.  The patient did not have access to video technology/had technical difficulties with video requiring transitioning to audio format only (telephone).  All issues noted in this document were discussed and addressed.  No physical exam could be performed with this format.  Please refer to the patient's chart for her  consent to telehealth for [facility].   The patient was identified using 2 identifiers.  Date:  05/20/2019   ID:  Martha Richard, DOB 1929-04-13, MRN [id number]  Patient Location: Home Provider Location: Home  PCP:  Manon Hilding, MD  Cardiologist:  Kate Sable, MD  Electrophysiologist:  None   Evaluation Performed:  Follow-Up Visit  Chief Complaint:  Aortic stenosis  History of Present Illness:    Martha Richard is a 86 y.o. female with aortic stenosis and a chronic left bundle branch block.  She denies chest pain, palpitations, leg swelling, dizziness, and shortness of breath.  She is under a lot of stress as she is taking care of her husband who is having a lot of health issues.   Past Medical History:  Diagnosis Date  . Arrhythmia    palpitations  . Arthritis   . Cancer (HCC)    squamous cell Left hand  . Complication of anesthesia    slow to wake up  . Headache    hx of migraines  . Hypertension   . Hypertensive retinopathy    OU   Past Surgical History:  Procedure Laterality Date  . APPENDECTOMY    . CATARACT EXTRACTION Bilateral    OU about 10 yrs ago  . EYE SURGERY    . fibroadenoma  left breast removal    . OTHER SURGICAL HISTORY     appendectomy  . TOTAL HIP ARTHROPLASTY Left 06/18/2016   Procedure: LEFT TOTAL HIP ARTHROPLASTY ANTERIOR APPROACH;  Surgeon: Gaynelle Arabian, MD;  Location: [facility];  Service: Orthopedics;  Laterality: Left;  . TOTAL HIP ARTHROPLASTY Right 05/05/2018   Procedure: TOTAL HIP ARTHROPLASTY ANTERIOR APPROACH;  Surgeon: Gaynelle Arabian, MD;  Location: [facility];  Service: Orthopedics;  Laterality: Right;  . UTERINE FIBROID SURGERY       Current Meds  Medication Sig  . hydrochlorothiazide (HYDRODIURIL) 12.5 MG tablet Take 12.5 mg by mouth daily as needed (high blood pressure).    Current Facility-Administered Medications for the 05/20/19 encounter (Telemedicine) with Herminio Commons, MD  Medication  . Bevacizumab (AVASTIN) SOLN 1.25 mg  . Bevacizumab (AVASTIN) SOLN 1.25 mg  . Bevacizumab (AVASTIN) SOLN 1.25 mg  . Bevacizumab (AVASTIN) SOLN 1.25 mg  . Bevacizumab (AVASTIN) SOLN 1.25 mg  . Bevacizumab (AVASTIN) SOLN 1.25 mg     Allergies:   Ace inhibitors, Caffeine, Nsaids, Peanut-containing drug products, Ultram [tramadol hcl], and Penicillins   Social History   Tobacco Use  . Smoking status: Former Research scientist (life sciences)  . Smokeless tobacco: Never Used  . Tobacco comment: years ago 50 plus years  Substance Use Topics  . Alcohol use: No  . Drug use: No     Family Hx: The patient's family history includes  Heart attack in her maternal aunt and mother; Heart disease in her maternal aunt and mother; Heart failure in her maternal aunt and mother; Stroke in her father. There is no history of Amblyopia, Blindness, Cataracts, Glaucoma, Macular degeneration, Retinal detachment, Strabismus, or Retinitis pigmentosa.  ROS:   Please see the history of present illness.     All other systems reviewed and are negative.   Prior CV studies:   The following studies were reviewed today:  Echocardiogram 04/21/2018:  1. The left ventricle has normal systolic  function with an ejection  fraction of 60-65%. The cavity size was normal. There is mildly increased  left ventricular wall thickness. Left ventricular diastolic Doppler  parameters are consistent with impaired  relaxation Elevated mean left atrial pressure.  2. The right ventricle has normal systolic function. The cavity was  normal. There is no increase in right ventricular wall thickness.  3. Left atrial size was mildly dilated.  4. The pericardial effusion is circumferential.  5. Trivial pericardial effusion is present.  6. The aortic valve has an indeterminant number of cusps Moderate  thickening of the aortic valve Moderate calcification of the aortic valve.  Aortic valve regurgitation is mild by color flow Doppler. moderate  stenosis of the aortic valve. Moderate aortic  annular calcification noted.  7. The mitral valve is normal in structure. Mild thickening of the mitral  valve leaflet. Mild calcification of the mitral valve leaflet. There is  mild mitral annular calcification present. No evidence of mitral valve  stenosis.  8. The tricuspid valve is normal in structure.  9. The aortic root is normal in size and structure.  10. Pulmonary hypertension is normal, PASP is 24.    She underwent a normal nuclear stress test on 05/23/16.  Labs/Other Tests and Data Reviewed:    EKG:  No ECG reviewed.  Recent Labs: No results found for requested labs within last 8760 hours.   Recent Lipid Panel No results found for: CHOL, TRIG, HDL, CHOLHDL, LDLCALC, LDLDIRECT  Wt Readings from Last 3 Encounters:  05/20/19 142 lb (64.4 kg)  05/05/18 156 lb 8.4 oz (71 kg)  04/30/18 158 lb 4 oz (71.8 kg)     Objective:    Vital Signs:  BP 122/72   Pulse (!) 57   Ht 5\' 3"  (1.6 m)   Wt 142 lb (64.4 kg)   BMI 25.15 kg/m    VITAL SIGNS:  reviewed  ASSESSMENT & PLAN:    1. Aortic valvular disease with moderate aortic stenosis and mild regurgitation: Symptomatically stable. I  will repeat an echocardiogram to assess for interval changes.  2. Left bundle-branch block: Chronic. Stable.  She denies chest pain and shortness of breath.  3. Hypertension: BP is normal. No changes to therapy.   COVID-19 Education: The signs and symptoms of COVID-19 were discussed with the patient and how to seek care for testing (follow up with PCP or arrange E-visit).  The importance of social distancing was discussed today.  Time:   Today, I have spent 15 minutes with the patient with telehealth technology discussing the above problems.     Medication Adjustments/Labs and Tests Ordered: Current medicines are reviewed at length with the patient today.  Concerns regarding medicines are outlined above.   Tests Ordered: No orders of the defined types were placed in this encounter.   Medication Changes: No orders of the defined types were placed in this encounter.   Follow Up:  Virtual Visit  in 1 year(s)  Signed, Kate Sable, MD  05/20/2019 10:11 AM    [place]

## 2019-05-20 NOTE — Addendum Note (Signed)
Addended by: Laurine Blazer on: 05/20/2019 10:37 AM   Modules accepted: Orders

## 2019-05-20 NOTE — Patient Instructions (Signed)

## 2019-06-22 ENCOUNTER — Other Ambulatory Visit: Payer: Medicare Other

## 2019-06-23 ENCOUNTER — Ambulatory Visit (INDEPENDENT_AMBULATORY_CARE_PROVIDER_SITE_OTHER): Payer: Medicare Other

## 2019-06-23 ENCOUNTER — Other Ambulatory Visit: Payer: Self-pay

## 2019-06-23 DIAGNOSIS — I350 Nonrheumatic aortic (valve) stenosis: Secondary | ICD-10-CM | POA: Diagnosis not present

## 2019-06-29 DIAGNOSIS — L820 Inflamed seborrheic keratosis: Secondary | ICD-10-CM | POA: Diagnosis not present

## 2019-07-04 DIAGNOSIS — I10 Essential (primary) hypertension: Secondary | ICD-10-CM | POA: Diagnosis not present

## 2019-07-04 DIAGNOSIS — E782 Mixed hyperlipidemia: Secondary | ICD-10-CM | POA: Diagnosis not present

## 2019-07-04 DIAGNOSIS — K2100 Gastro-esophageal reflux disease with esophagitis, without bleeding: Secondary | ICD-10-CM | POA: Diagnosis not present

## 2019-07-05 ENCOUNTER — Telehealth: Payer: Self-pay | Admitting: *Deleted

## 2019-07-05 NOTE — Telephone Encounter (Signed)
-----   Message from Herminio Commons, MD sent at 06/24/2019  2:45 PM EDT ----- Pumping function is normal. Valve leakage is noted. Moderate aortic valve narrowing. I will monitor.

## 2019-07-05 NOTE — Telephone Encounter (Signed)
Laurine Blazer, [place]  579FGE 579FGE PM EDT    Patient notified. Copy to pcp.

## 2019-07-06 DIAGNOSIS — R7301 Impaired fasting glucose: Secondary | ICD-10-CM | POA: Diagnosis not present

## 2019-07-06 DIAGNOSIS — I951 Orthostatic hypotension: Secondary | ICD-10-CM | POA: Diagnosis not present

## 2019-07-06 DIAGNOSIS — G2581 Restless legs syndrome: Secondary | ICD-10-CM | POA: Diagnosis not present

## 2019-07-06 DIAGNOSIS — M1991 Primary osteoarthritis, unspecified site: Secondary | ICD-10-CM | POA: Diagnosis not present

## 2019-07-06 DIAGNOSIS — I10 Essential (primary) hypertension: Secondary | ICD-10-CM | POA: Diagnosis not present

## 2019-09-22 DIAGNOSIS — M1711 Unilateral primary osteoarthritis, right knee: Secondary | ICD-10-CM | POA: Diagnosis not present

## 2019-09-22 DIAGNOSIS — M25561 Pain in right knee: Secondary | ICD-10-CM | POA: Diagnosis not present

## 2019-09-30 ENCOUNTER — Telehealth: Payer: Self-pay | Admitting: Cardiology

## 2019-09-30 NOTE — Telephone Encounter (Signed)
Primary Cardiologist:Suresh Bronson Ing, MD (Inactive)  Chart reviewed as part of pre-operative protocol coverage. Because of Martha Richard's past medical history and time since last visit, he/she will require a follow-up visit in order to better assess preoperative cardiovascular risk.  Pre-op covering staff: - Please schedule appointment and call patient to inform them. - Please contact requesting surgeon's office via preferred method (i.e, phone, fax) to inform them of need for appointment prior to surgery.  If applicable, this message will also be routed to pharmacy pool and/or primary cardiologist for input on holding anticoagulant/antiplatelet agent as requested below so that this information is available at time of patient's appointment.   Deberah Pelton, NP  09/30/2019, 2:50 PM

## 2019-09-30 NOTE — Telephone Encounter (Signed)
   [facility] Pre-operative Risk Assessment    HEARTCARE STAFF: - Please ensure there is not already an duplicate clearance open for this procedure. - Under Visit Info/Reason for Call, type in Other and utilize the format Clearance MM/DD/YY or Clearance TBD. Do not use dashes or single digits. - If request is for dental extraction, please clarify the # of teeth to be extracted.  Request for surgical clearance:  1. What type of surgery is being performed?  1. RIGHT TOTAL KNEE ARTHROPLASTY   2. When is this surgery scheduled?  1. 11/07/2019   3. What type of clearance is required (medical clearance vs. Pharmacy clearance to hold med vs. Both)? 1. Medical Clearance  4. Are there any medications that need to be held prior to surgery and how long? 1. Does not state  5. Practice name and name of physician performing surgery?  1. [facility]- Dr. Pilar Plate Aluisio  6. What is the office phone number?  [place]    7.   What is the office fax number?         1.  [phone]  8.   Anesthesia type (None, local, MAC, general) ?          1. Choice  Desma Paganini 09/30/2019, 2:38 PM  _________________________________________________________________   (provider comments below)

## 2019-09-30 NOTE — Telephone Encounter (Signed)
Attempted to reach patient to schedule appt for preop clearance. No answer. Will send to [facility] scheduling

## 2019-10-03 NOTE — Telephone Encounter (Signed)
Pt scheduled to see Katina Dung, NP 10/11/19.  Will route back to the requesting surgeon's office to make them aware clearance will be addressed at that visit.

## 2019-10-04 DIAGNOSIS — H524 Presbyopia: Secondary | ICD-10-CM | POA: Diagnosis not present

## 2019-10-04 DIAGNOSIS — Z961 Presence of intraocular lens: Secondary | ICD-10-CM | POA: Diagnosis not present

## 2019-10-04 DIAGNOSIS — H353111 Nonexudative age-related macular degeneration, right eye, early dry stage: Secondary | ICD-10-CM | POA: Diagnosis not present

## 2019-10-04 DIAGNOSIS — H353222 Exudative age-related macular degeneration, left eye, with inactive choroidal neovascularization: Secondary | ICD-10-CM | POA: Diagnosis not present

## 2019-10-06 DIAGNOSIS — E782 Mixed hyperlipidemia: Secondary | ICD-10-CM | POA: Diagnosis not present

## 2019-10-06 DIAGNOSIS — R011 Cardiac murmur, unspecified: Secondary | ICD-10-CM | POA: Diagnosis not present

## 2019-10-06 DIAGNOSIS — I4891 Unspecified atrial fibrillation: Secondary | ICD-10-CM | POA: Diagnosis not present

## 2019-10-06 DIAGNOSIS — M1711 Unilateral primary osteoarthritis, right knee: Secondary | ICD-10-CM | POA: Diagnosis not present

## 2019-10-06 DIAGNOSIS — I951 Orthostatic hypotension: Secondary | ICD-10-CM | POA: Diagnosis not present

## 2019-10-10 NOTE — Progress Notes (Signed)
Cardiology Office Note  Date: 10/11/2019   ID: Martha Richard, DOB Sep 23, 1929, MRN [id number]  PCP:  Manon Hilding, MD  Cardiologist:  No primary care provider on file. Electrophysiologist:  None   Chief Complaint: Preop surgical clearance  History of Present Illness: Martha Richard is a 86 y.o. female with a history of aortic valve stenosis, chronic left bundle branch block, palpitations, hypertension.  Patient is scheduled for a right TKA by Dr Wynelle Link 11/07/2019  Last encounter with Dr. Bronson Ing 05/20/2019 via telemedicine.  She denied any chest pain, palpitations, leg swelling, dizziness, shortness of breath.  She was under a lot of stressors she was taking care of her husband who was having a lot of health issues.  Previous echocardiogram showed moderate aortic stenosis and mild regurgitation.  She was symptomatically stable.  Follow-up echocardiogram was ordered.  Chronic left bundle branch block was stable.  Blood pressure was normal.  There were no changes to medical therapy.  She is here for cardiac clearance for right total knee arthroplasty by Dr. Wynelle Link on 11/07/2019.  She denies any anginal or exertional symptoms, palpitations or arrhythmias, orthostatic symptoms, stroke or TIA-like symptoms, bleeding issues, PND or orthopnea, claudication-like symptoms, DVT or PE-like symptoms, or lower extremity edema.  She has had bilateral hip replacements in the past by Dr. Wynelle Link.  Past Medical History:  Diagnosis Date  . Arrhythmia    palpitations  . Arthritis   . Cancer (HCC)    squamous cell Left hand  . Complication of anesthesia    slow to wake up  . Headache    hx of migraines  . Hypertension   . Hypertensive retinopathy    OU    Past Surgical History:  Procedure Laterality Date  . APPENDECTOMY    . CATARACT EXTRACTION Bilateral    OU about 10 yrs ago  . EYE SURGERY    . fibroadenoma left breast removal    . OTHER SURGICAL HISTORY     appendectomy  .  TOTAL HIP ARTHROPLASTY Left 06/18/2016   Procedure: LEFT TOTAL HIP ARTHROPLASTY ANTERIOR APPROACH;  Surgeon: Gaynelle Arabian, MD;  Location: [facility];  Service: Orthopedics;  Laterality: Left;  . TOTAL HIP ARTHROPLASTY Right 05/05/2018   Procedure: TOTAL HIP ARTHROPLASTY ANTERIOR APPROACH;  Surgeon: Gaynelle Arabian, MD;  Location: [facility];  Service: Orthopedics;  Laterality: Right;  . UTERINE FIBROID SURGERY      Current Outpatient Medications  Medication Sig Dispense Refill  . hydrochlorothiazide (HYDRODIURIL) 12.5 MG tablet Take 12.5 mg by mouth daily as needed (high blood pressure).     . vitamin B-12 (CYANOCOBALAMIN) 500 MCG tablet Take 500 mcg by mouth daily.     Current Facility-Administered Medications  Medication Dose Route Frequency Provider Last Rate Last Admin  . Bevacizumab (AVASTIN) SOLN 1.25 mg  1.25 mg Intravitreal  Bernarda Caffey, MD   1.25 mg at 04/20/17 1138  . Bevacizumab (AVASTIN) SOLN 1.25 mg  1.25 mg Intravitreal  Bernarda Caffey, MD   1.25 mg at 04/20/17 1455  . Bevacizumab (AVASTIN) SOLN 1.25 mg  1.25 mg Intravitreal  Bernarda Caffey, MD   1.25 mg at 05/25/17 1345  . Bevacizumab (AVASTIN) SOLN 1.25 mg  1.25 mg Intravitreal  Bernarda Caffey, MD   1.25 mg at 06/30/17 1351  . Bevacizumab (AVASTIN) SOLN 1.25 mg  1.25 mg Intravitreal  Bernarda Caffey, MD   1.25 mg at 08/23/17 0034  . Bevacizumab (AVASTIN) SOLN 1.25 mg  1.25 mg Intravitreal  Coralyn Pear,  Aaron Edelman, MD   1.25 mg at 04/16/18 1349   Allergies:  Ace inhibitors, Caffeine, Nsaids, Peanut-containing drug products, Ultram [tramadol hcl], and Penicillins   Social History: The patient  reports that she has quit smoking. She has never used smokeless tobacco. She reports that she does not drink alcohol and does not use drugs.   Family History: The patient's family history includes Heart attack in her maternal aunt and mother; Heart disease in her maternal aunt and mother; Heart failure in her maternal aunt and mother; Stroke in her father.    ROS:  Please see the history of present illness. Otherwise, complete review of systems is positive for none.  All other systems are reviewed and negative.   Physical Exam: VS:  BP (!) 180/80 (BP Location: Left Arm, Cuff Size: Normal)   Pulse 60   Ht 5\' 3"  (1.6 m)   Wt 145 lb 6.4 oz (66 kg)   SpO2 98%   BMI 25.76 kg/m , BMI Body mass index is 25.76 kg/m.  Wt Readings from Last 3 Encounters:  10/11/19 145 lb 6.4 oz (66 kg)  05/20/19 142 lb (64.4 kg)  05/05/18 156 lb 8.4 oz (71 kg)    General: Patient appears comfortable at rest. Neck: Supple, no elevated JVP or carotid bruits, no thyromegaly. Lungs: Clear to auscultation, nonlabored breathing at rest. Cardiac: Regular rate and rhythm, no S3 or significant systolic murmur, no pericardial rub. Extremities: No pitting edema, distal pulses 2+. Skin: Warm and dry. Musculoskeletal: No kyphosis. Neuropsychiatric: Alert and oriented x3, affect grossly appropriate.  ECG:  An ECG dated 10/11/2019 was personally reviewed today and demonstrated:  Sinus bradycardia rate of 57, left bundle branch block.  Recent Labwork: No results found for requested labs within last 8760 hours.  No results found for: CHOL, TRIG, HDL, CHOLHDL, VLDL, LDLCALC, LDLDIRECT  Other Studies Reviewed Today:  Echocardiogram 06/24/2019  1. Left ventricular ejection fraction, by estimation, is 55 to 60%. The left ventricle has normal function. The left ventricle has no regional wall motion abnormalities. Left ventricular diastolic parameters are consistent with Grade I diastolic dysfunction (impaired relaxation). Elevated left ventricular end-diastolic pressure. 2. Right ventricular systolic function is low normal. The right ventricular size is normal. Mildly increased right ventricular wall thickness. There is moderately elevated pulmonary artery systolic pressure. 3. Left atrial size was mildly dilated. 4. Small circumferential pericardial effusion without  hemodynamic compromise. The pericardial effusion is circumferential. There is no evidence of cardiac tamponade. 5. The mitral valve is degenerative. Moderate mitral valve regurgitation. 6. Tricuspid valve regurgitation is mild to moderate. 7. The aortic valve is tricuspid. Aortic valve regurgitation is mild. Moderate aortic valve stenosis. Aortic valve area, by VTI measures 1.25 cm. Aortic valve mean gradient measures 24.2 mmHg. Aortic valve Vmax measures 3.21 m/s. 8. The inferior vena cava is normal in size with greater than 50% respiratory variability, suggesting right atrial pressure of 3 mmHg. Comparison(s): Echocardiogram done 04/21/18 showed an EF of 65% with moderate AS and an AV Peak Grad od 34.6 mmHg. A trivial circumferencial pericardial effusion was present.   Echocardiogram 04/21/2018:  1. The left ventricle has normal systolic function with an ejection  fraction of 60-65%. The cavity size was normal. There is mildly increased  left ventricular wall thickness. Left ventricular diastolic Doppler  parameters are consistent with impaired  relaxation Elevated mean left atrial pressure.  2. The right ventricle has normal systolic function. The cavity was  normal. There is no increase in right ventricular  wall thickness.  3. Left atrial size was mildly dilated.  4. The pericardial effusion is circumferential.  5. Trivial pericardial effusion is present.  6. The aortic valve has an indeterminant number of cusps Moderate  thickening of the aortic valve Moderate calcification of the aortic valve.  Aortic valve regurgitation is mild by color flow Doppler. moderate  stenosis of the aortic valve. Moderate aortic  annular calcification noted.  7. The mitral valve is normal in structure. Mild thickening of the mitral  valve leaflet. Mild calcification of the mitral valve leaflet. There is  mild mitral annular calcification present. No evidence of mitral valve  stenosis.  8.  The tricuspid valve is normal in structure.  9. The aortic root is normal in size and structure.  10. Pulmonary hypertension is normal, PASP is 24.    She underwent a normal nuclear stress test on 05/23/16.   Assessment and Plan:  1. Preoperative clearance   2. Aortic valve stenosis, etiology of cardiac valve disease unspecified   3. Essential hypertension   4. LBBB (left bundle branch block)    1. Preoperative clearance Pending R TKA Dr Wynelle Link 11/07/2019.  Patient has a revised cardiac risk index of 0 putting patient at a 0.4% perioperative risk of major cardiac event.  She is cleared from a cardiac standpoint to undergo right total knee arthroplasty under general anesthesia.  2. Aortic valve stenosis, etiology of cardiac valve disease unspecified Moderate AS on recent echocardiogram 06/24/2019.  Audible systolic heart murmur heard on exam 2/6 best heard at right upper sternal border.  She is asymptomatic.  3. Essential hypertension Blood pressure elevated on arrival at 170/70.  States she ate cantaloupe with salt this morning Patient states her blood pressures at home are usually within normal limits around 768T to 157W systolic.  Continue HCTZ 12.5 mg daily as needed.  4. LBBB (left bundle branch block) Chronic LBBB. No symptoms. LBBB present on EKG's in 2018 and 2020.  EKG today shows sinus bradycardia rate of 57 with left bundle branch block.  Medication Adjustments/Labs and Tests Ordered: Current medicines are reviewed at length with the patient today.  Concerns regarding medicines are outlined above.   Disposition: Follow-up with Dr. Domenic Polite or APP 1 year  Signed, Levell July, NP 10/11/2019 8:38 AM    [facility], [place], [place] [address] Phone: [phone]; Fax: [phone]

## 2019-10-11 ENCOUNTER — Ambulatory Visit (INDEPENDENT_AMBULATORY_CARE_PROVIDER_SITE_OTHER): Payer: Medicare Other | Admitting: Family Medicine

## 2019-10-11 ENCOUNTER — Encounter: Payer: Self-pay | Admitting: Family Medicine

## 2019-10-11 VITALS — BP 170/70 | HR 60 | Ht 63.0 in | Wt 145.4 lb

## 2019-10-11 DIAGNOSIS — I447 Left bundle-branch block, unspecified: Secondary | ICD-10-CM

## 2019-10-11 DIAGNOSIS — I10 Essential (primary) hypertension: Secondary | ICD-10-CM | POA: Diagnosis not present

## 2019-10-11 DIAGNOSIS — Z01818 Encounter for other preprocedural examination: Secondary | ICD-10-CM | POA: Diagnosis not present

## 2019-10-11 DIAGNOSIS — I350 Nonrheumatic aortic (valve) stenosis: Secondary | ICD-10-CM

## 2019-10-11 NOTE — Patient Instructions (Addendum)

## 2019-10-17 ENCOUNTER — Ambulatory Visit: Payer: Medicare Other | Admitting: Family Medicine

## 2019-10-17 DIAGNOSIS — M1711 Unilateral primary osteoarthritis, right knee: Secondary | ICD-10-CM | POA: Diagnosis not present

## 2019-10-26 ENCOUNTER — Encounter (HOSPITAL_COMMUNITY): Admission: RE | Admit: 2019-10-26 | Payer: Medicare Other | Source: Ambulatory Visit

## 2019-10-26 ENCOUNTER — Other Ambulatory Visit (HOSPITAL_COMMUNITY): Payer: Medicare Other

## 2019-11-07 ENCOUNTER — Ambulatory Visit: Admit: 2019-11-07 | Payer: Medicare Other | Admitting: Orthopedic Surgery

## 2019-11-07 SURGERY — ARTHROPLASTY, KNEE, TOTAL
Anesthesia: Choice | Site: Knee | Laterality: Right

## 2019-12-19 ENCOUNTER — Telehealth: Payer: Self-pay | Admitting: Family Medicine

## 2019-12-19 NOTE — Telephone Encounter (Signed)
Copy of last OV from Katina Dung, NP faxed to Dr. Peri Maris office.

## 2019-12-19 NOTE — Telephone Encounter (Signed)
Patient called stating that she received telephone call from [phone] - [place] Chard   at the office of Dr. Lazarus Gowda.  Dr. Karel Jarvis office states that they have never received notification of patient's pre op clearance for her upcoming knee surgery.

## 2019-12-26 NOTE — Progress Notes (Addendum)
COVID Vaccine Completed: x2 Date COVID Vaccine completed:  07-14-19 08-09-19 COVID vaccine manufacturer: [employer]   PCP - Consuello Masse, MD Cardiologist - Kate Sable, MD  Chest x-ray -  EKG - 10-11-19 in Epic Stress Test - 05-23-16 in Epic ECHO - 06-23-19 in Epic Cardiac Cath -  Pacemaker/ICD device last checked:  Sleep Study -  CPAP -   Fasting Blood Sugar -  Checks Blood Sugar _____ times a day  Blood Thinner Instructions: Aspirin Instructions: Last Dose:  Anesthesia review:  Arrythmia, palpitations, LBBB, Aortic stenosis, mitral regurg.  BP 219/83 at PAT  Patient denies shortness of breath, fever, cough and chest pain at PAT appointment   Patient verbalized understanding of instructions that were given to them at the PAT appointment. Patient was also instructed that they will need to review over the PAT instructions again at home before surgery.

## 2019-12-26 NOTE — Patient Instructions (Addendum)
DUE TO COVID-19 ONLY ONE VISITOR IS ALLOWED TO COME WITH YOU AND STAY IN THE WAITING ROOM ONLY DURING PRE OP AND PROCEDURE.   IF YOU WILL BE ADMITTED INTO THE HOSPITAL YOU ARE ALLOWED ONE SUPPORT PERSON DURING VISITATION HOURS ONLY (10AM -8PM)   . The support person may change daily. . The support person must pass our screening, gel in and out, and wear a mask at all times, including in the patient's room. . Patients must also wear a mask when staff or their support person are in the room.   COVID SWAB TESTING MUST BE COMPLETED ON:  Thursday, 12-29-19 @ 1:10 PM   [address] [place], [place] [address]  (Must self quarantine after testing. Follow instructions on handout.)   Your procedure is scheduled on: Monday, 01-02-20   Report to [facility] Main  Entrance   Report to admitting at 8:00 AM   Call this number if you have problems the morning of surgery [phone]   Do not eat food :After Midnight.   May have liquids until 7:30 AM day of surgery  CLEAR LIQUID DIET  Foods Allowed                                                                     Foods Excluded  Water, Black Coffee and tea, regular and decaf               liquids that you cannot  Plain Jell-O in any flavor  (No red)                                     see through such as: Fruit ices (not with fruit pulp)                                      milk, soups, orange juice              Iced Popsicles (No red)                                      All solid food                                   Apple juices Sports drinks like Gatorade (No red) Lightly seasoned clear broth or consume(fat free) Sugar, honey syrup     Complete one Ensure drink the morning of surgery at 7:30 AM  the day of surgery.     Oral Hygiene is also important to reduce your risk of infection.                                    Remember - BRUSH YOUR TEETH THE MORNING OF SURGERY WITH YOUR REGULAR TOOTHPASTE   Do NOT smoke after  Midnight   Take these medicines the morning of surgery with  A SIP OF WATER:  None  DO NOT TAKE ANY ORAL DIABETIC MEDICATIONS DAY OF YOUR SURGERY                               You may not have any metal on your body including hair pins, jewelry, and body piercings             Do not wear make-up, lotions, powders, perfumes/cologne, or deodorant             Do not wear nail polish.  Do not shave  48 hours prior to surgery.     Do not bring valuables to the hospital. [place].   Contacts, dentures or bridgework may not be worn into surgery.   Bring small overnight bag day of surgery.                Please read over the following fact sheets you were given: IF YOU HAVE QUESTIONS ABOUT YOUR PRE OP INSTRUCTIONS PLEASE CALL [phone]   [place] - Preparing for Surgery Before surgery, you can play an important role.  Because skin is not sterile, your skin needs to be as free of germs as possible.  You can reduce the number of germs on your skin by washing with CHG (chlorahexidine gluconate) soap before surgery.  CHG is an antiseptic cleaner which kills germs and bonds with the skin to continue killing germs even after washing. Please DO NOT use if you have an allergy to CHG or antibacterial soaps.  If your skin becomes reddened/irritated stop using the CHG and inform your nurse when you arrive at Short Stay. Do not shave (including legs and underarms) for at least 48 hours prior to the first CHG shower.  You may shave your face/neck.  Please follow these instructions carefully:  1.  Shower with CHG Soap the night before surgery and the  morning of surgery.  2.  If you choose to wash your hair, wash your hair first as usual with your normal  shampoo.  3.  After you shampoo, rinse your hair and body thoroughly to remove the shampoo.                             4.  Use CHG as you would any other liquid soap.  You can apply chg directly to the skin and  wash.  Gently with a scrungie or clean washcloth.  5.  Apply the CHG Soap to your body ONLY FROM THE NECK DOWN.   Do   not use on face/ open                           Wound or open sores. Avoid contact with eyes, ears mouth and   genitals (private parts).                       Wash face,  Genitals (private parts) with your normal soap.             6.  Wash thoroughly, paying special attention to the area where your    surgery  will be performed.  7.  Thoroughly rinse your body with warm water from the neck down.  8.  DO NOT shower/wash with your normal soap after using and rinsing  off the CHG Soap.                9.  Pat yourself dry with a clean towel.            10.  Wear clean pajamas.            11.  Place clean sheets on your bed the night of your first shower and do not  sleep with pets. Day of Surgery : Do not apply any lotions/deodorants the morning of surgery.  Please wear clean clothes to the hospital/surgery center.  FAILURE TO FOLLOW THESE INSTRUCTIONS MAY RESULT IN THE CANCELLATION OF YOUR SURGERY  PATIENT SIGNATURE_________________________________  NURSE SIGNATURE__________________________________  ________________________________________________________________________   Martha Richard  An incentive spirometer is a tool that can help keep your lungs clear and active. This tool measures how well you are filling your lungs with each breath. Taking long deep breaths may help reverse or decrease the chance of developing breathing (pulmonary) problems (especially infection) following:  A long period of time when you are unable to move or be active. BEFORE THE PROCEDURE   If the spirometer includes an indicator to show your best effort, your nurse or respiratory therapist will set it to a desired goal.  If possible, sit up straight or lean slightly forward. Try not to slouch.  Hold the incentive spirometer in an upright position. INSTRUCTIONS FOR USE  1. Sit on the  edge of your bed if possible, or sit up as far as you can in bed or on a chair. 2. Hold the incentive spirometer in an upright position. 3. Breathe out normally. 4. Place the mouthpiece in your mouth and seal your lips tightly around it. 5. Breathe in slowly and as deeply as possible, raising the piston or the ball toward the top of the column. 6. Hold your breath for 3-5 seconds or for as long as possible. Allow the piston or ball to fall to the bottom of the column. 7. Remove the mouthpiece from your mouth and breathe out normally. 8. Rest for a few seconds and repeat Steps 1 through 7 at least 10 times every 1-2 hours when you are awake. Take your time and take a few normal breaths between deep breaths. 9. The spirometer may include an indicator to show your best effort. Use the indicator as a goal to work toward during each repetition. 10. After each set of 10 deep breaths, practice coughing to be sure your lungs are clear. If you have an incision (the cut made at the time of surgery), support your incision when coughing by placing a pillow or rolled up towels firmly against it. Once you are able to get out of bed, walk around indoors and cough well. You may stop using the incentive spirometer when instructed by your caregiver.  RISKS AND COMPLICATIONS  Take your time so you do not get dizzy or light-headed.  If you are in pain, you may need to take or ask for pain medication before doing incentive spirometry. It is harder to take a deep breath if you are having pain. AFTER USE  Rest and breathe slowly and easily.  It can be helpful to keep track of a log of your progress. Your caregiver can provide you with a simple table to help with this. If you are using the spirometer at home, follow these instructions: [place] IF:   You are having difficultly using the spirometer.  You have trouble using the spirometer  as often as instructed.  Your pain medication is not giving enough  relief while using the spirometer.  You develop fever of 100.5 F (38.1 C) or higher. SEEK IMMEDIATE MEDICAL CARE IF:   You cough up bloody sputum that had not been present before.  You develop fever of 102 F (38.9 C) or greater.  You develop worsening pain at or near the incision site. MAKE SURE YOU:   Understand these instructions.  Will watch your condition.  Will get help right away if you are not doing well or get worse. Document Released: 06/23/2006 Document Revised: 05/05/2011 Document Reviewed: 08/24/2006 ExitCare Patient Information 2014 ExitCare, [place].   ________________________________________________________________________  WHAT IS A BLOOD TRANSFUSION? Blood Transfusion Information  A transfusion is the replacement of blood or some of its parts. Blood is made up of multiple cells which provide different functions.  Red blood cells carry oxygen and are used for blood loss replacement.  White blood cells fight against infection.  Platelets control bleeding.  Plasma helps clot blood.  Other blood products are available for specialized needs, such as hemophilia or other clotting disorders. BEFORE THE TRANSFUSION  Who gives blood for transfusions?   Healthy volunteers who are fully evaluated to make sure their blood is safe. This is blood bank blood. Transfusion therapy is the safest it has ever been in the practice of medicine. Before blood is taken from a donor, a complete history is taken to make sure that person has no history of diseases nor engages in risky social behavior (examples are intravenous drug use or sexual activity with multiple partners). The donor's travel history is screened to minimize risk of transmitting infections, such as malaria. The donated blood is tested for signs of infectious diseases, such as HIV and hepatitis. The blood is then tested to be sure it is compatible with you in order to minimize the chance of a transfusion reaction. If  you or a relative donates blood, this is often done in anticipation of surgery and is not appropriate for emergency situations. It takes many days to process the donated blood. RISKS AND COMPLICATIONS Although transfusion therapy is very safe and saves many lives, the main dangers of transfusion include:   Getting an infectious disease.  Developing a transfusion reaction. This is an allergic reaction to something in the blood you were given. Every precaution is taken to prevent this. The decision to have a blood transfusion has been considered carefully by your caregiver before blood is given. Blood is not given unless the benefits outweigh the risks. AFTER THE TRANSFUSION  Right after receiving a blood transfusion, you will usually feel much better and more energetic. This is especially true if your red blood cells have gotten low (anemic). The transfusion raises the level of the red blood cells which carry oxygen, and this usually causes an energy increase.  The nurse administering the transfusion will monitor you carefully for complications. HOME CARE INSTRUCTIONS  No special instructions are needed after a transfusion. You may find your energy is better. Speak with your caregiver about any limitations on activity for underlying diseases you may have. SEEK MEDICAL CARE IF:   Your condition is not improving after your transfusion.  You develop redness or irritation at the intravenous (IV) site. SEEK IMMEDIATE MEDICAL CARE IF:  Any of the following symptoms occur over the next 12 hours:  Shaking chills.  You have a temperature by mouth above 102 F (38.9 C), not controlled by medicine.  Chest, back,  or muscle pain.  People around you feel you are not acting correctly or are confused.  Shortness of breath or difficulty breathing.  Dizziness and fainting.  You get a rash or develop hives.  You have a decrease in urine output.  Your urine turns a dark color or changes to pink,  red, or brown. Any of the following symptoms occur over the next 10 days:  You have a temperature by mouth above 102 F (38.9 C), not controlled by medicine.  Shortness of breath.  Weakness after normal activity.  The white part of the eye turns yellow (jaundice).  You have a decrease in the amount of urine or are urinating less often.  Your urine turns a dark color or changes to pink, red, or brown. Document Released: 02/08/2000 Document Revised: 05/05/2011 Document Reviewed: 09/27/2007 [facility] Patient Information 2014 [place], [place].  _______________________________________________________________________

## 2019-12-27 ENCOUNTER — Encounter (HOSPITAL_COMMUNITY)
Admission: RE | Admit: 2019-12-27 | Discharge: 2019-12-27 | Disposition: A | Discharge status: Home / Self Care | Payer: Medicare Other | Source: Ambulatory Visit | Attending: Orthopedic Surgery | Admitting: Orthopedic Surgery

## 2019-12-27 ENCOUNTER — Other Ambulatory Visit: Payer: Self-pay

## 2019-12-27 ENCOUNTER — Encounter (HOSPITAL_COMMUNITY): Payer: Self-pay

## 2019-12-27 DIAGNOSIS — Z01812 Encounter for preprocedural laboratory examination: Secondary | ICD-10-CM | POA: Diagnosis not present

## 2019-12-27 HISTORY — DX: Left bundle-branch block, unspecified: I44.7

## 2019-12-27 HISTORY — DX: Depression, unspecified: F32.A

## 2019-12-27 LAB — SURGICAL PCR SCREEN
MRSA, PCR: NEGATIVE
Staphylococcus aureus: NEGATIVE

## 2019-12-27 LAB — CBC
HCT: 41.3 % (ref 36.0–46.0)
Hemoglobin: 13.1 g/dL (ref 12.0–15.0)
MCH: 29.8 pg (ref 26.0–34.0)
MCHC: 31.7 g/dL (ref 30.0–36.0)
MCV: 94.1 fL (ref 80.0–100.0)
Platelets: 246 10*3/uL (ref 150–400)
RBC: 4.39 MIL/uL (ref 3.87–5.11)
RDW: 13.9 % (ref 11.5–15.5)
WBC: 8.3 10*3/uL (ref 4.0–10.5)
nRBC: 0 % (ref 0.0–0.2)

## 2019-12-27 LAB — COMPREHENSIVE METABOLIC PANEL
ALT: 12 U/L (ref 0–44)
AST: 16 U/L (ref 15–41)
Albumin: 4 g/dL (ref 3.5–5.0)
Alkaline Phosphatase: 70 U/L (ref 38–126)
Anion gap: 7 (ref 5–15)
BUN: 20 mg/dL (ref 8–23)
CO2: 27 mmol/L (ref 22–32)
Calcium: 9.3 mg/dL (ref 8.9–10.3)
Chloride: 105 mmol/L (ref 98–111)
Creatinine, Ser: 0.72 mg/dL (ref 0.44–1.00)
GFR, Estimated: >60 mL/min (ref >60)
Glucose, Bld: 103 mg/dL — ABNORMAL HIGH (ref 70–99)
Potassium: 4.5 mmol/L (ref 3.5–5.1)
Sodium: 139 mmol/L (ref 135–145)
Total Bilirubin: 0.6 mg/dL (ref 0.3–1.2)
Total Protein: 6.8 g/dL (ref 6.5–8.1)

## 2019-12-27 LAB — PROTIME-INR
INR: 1 (ref 0.8–1.2)
Prothrombin Time: 12.6 seconds (ref 11.4–15.2)

## 2019-12-27 LAB — APTT: aPTT: 30 seconds (ref 24–36)

## 2019-12-28 NOTE — Progress Notes (Signed)
Per [place] in the Blood Bank. Pt's positive for antibodies. T&S lab needs to be redrawn day of surgery. Order placed.

## 2019-12-29 ENCOUNTER — Other Ambulatory Visit (HOSPITAL_COMMUNITY)
Admission: RE | Admit: 2019-12-29 | Discharge: 2019-12-29 | Disposition: A | Discharge status: Home / Self Care | Payer: Medicare Other | Source: Ambulatory Visit | Attending: Orthopedic Surgery | Admitting: Orthopedic Surgery

## 2019-12-29 DIAGNOSIS — Z20822 Contact with and (suspected) exposure to covid-19: Secondary | ICD-10-CM | POA: Diagnosis not present

## 2019-12-29 DIAGNOSIS — Z01818 Encounter for other preprocedural examination: Secondary | ICD-10-CM | POA: Diagnosis not present

## 2019-12-29 LAB — SARS CORONAVIRUS 2 (TAT 6-24 HRS): SARS Coronavirus 2: NEGATIVE

## 2019-12-29 NOTE — Progress Notes (Signed)
Anesthesia Chart Review   Case: [id number] Date/Time: 01/02/20 1015   Procedure: TOTAL KNEE ARTHROPLASTY (Right Knee) - 38min   Anesthesia type: Choice   Pre-op diagnosis: right knee osteoarthritis   Location: [facility] ROOM 10 / [facility]   Surgeons: Gaynelle Arabian, MD      DISCUSSION:86 y.o. former smoker with h/o HTN, asymptomatic moderate AS (on Echo 06/23/2019 Aortic valve area, by VTI measures 1.25 cm. Aortic valve mean gradient measures 24.2 mmHg. Aortic valve Vmax measures 3.21 m/s), LBBB, right knee OA scheduled for above procedure 01/02/2020 with Dr. Gaynelle Arabian.   Pt last seen by cardiology 10/11/2019.  Per OV note, "Pending R TKA Dr Wynelle Link 11/07/2019.  Patient has a revised cardiac risk index of 0 putting patient at a 0.4% perioperative risk of major cardiac event.  She is cleared from a cardiac standpoint to undergo right total knee arthroplasty under general anesthesia."  Elevated blood pressure at PAT.  Pt reports pressures at home around 993Z to 169C systolic.  Pt will contact cardiology and continue to check pressures at home.  Will evaluate DOS.  VS: BP (!) 219/83   Pulse (!) 58   Temp 36.9 C (Oral)   Resp 18   Ht 5\' 2"  (1.575 m)   Wt 64.9 kg   SpO2 100%   BMI 26.16 kg/m   PROVIDERS: Sasser, Silvestre Moment, MD is PCP   Kate Sable, MD is Cardiologist  LABS: Labs reviewed: Acceptable for surgery. (all labs ordered are listed, but only abnormal results are displayed)  Labs Reviewed  COMPREHENSIVE METABOLIC PANEL - Abnormal; Notable for the following components:      Result Value   Glucose, Bld 103 (*)    All other components within normal limits  SURGICAL PCR SCREEN  APTT  CBC  PROTIME-INR  TYPE AND SCREEN     IMAGES:   EKG: 10/11/2019 Rate 57 bpm  Sinus bradycardia  LBBB  CV: Echo 06/23/2019 IMPRESSIONS    1. Left ventricular ejection fraction, by estimation, is 55 to 60%. The  left ventricle has normal function. The left ventricle has no regional   wall motion abnormalities. Left ventricular diastolic parameters are  consistent with Grade I diastolic  dysfunction (impaired relaxation). Elevated left ventricular end-diastolic  pressure.  2. Right ventricular systolic function is low normal. The right  ventricular size is normal. Mildly increased right ventricular wall  thickness. There is moderately elevated pulmonary artery systolic  pressure.  3. Left atrial size was mildly dilated.  4. Small circumferential pericardial effusion without hemodynamic  compromise. The pericardial effusion is circumferential. There is no  evidence of cardiac tamponade.  5. The mitral valve is degenerative. Moderate mitral valve regurgitation.  6. Tricuspid valve regurgitation is mild to moderate.  7. The aortic valve is tricuspid. Aortic valve regurgitation is mild.  Moderate aortic valve stenosis. Aortic valve area, by VTI measures 1.25  cm. Aortic valve mean gradient measures 24.2 mmHg. Aortic valve Vmax  measures 3.21 m/s.  8. The inferior vena cava is normal in size with greater than 50%  respiratory variability, suggesting right atrial pressure of 3 mmHg.   Comparison(s): Echocardiogram done 04/21/18 showed an EF of 65% with  moderate AS adn an AV Peak Grad od 34.6 mmHg. A trivial circumferencial  pericardial effusion was present.  Myocardial Perfusion 04/26/2016  The study is normal. There are no perfusion defects  The left ventricular ejection fraction is hyperdynamic (>65%).  This is a low risk study.  There was no  ST segment deviation noted during stress.  LBBB at baseline Past Medical History:  Diagnosis Date  . Arrhythmia    palpitations  . Arthritis   . Cancer (HCC)    squamous cell Left hand  . Complication of anesthesia    slow to wake up  . Depression    Lost a son to cancer  . Headache    hx of migraines  . Hypertension   . Hypertensive retinopathy    OU  . Left bundle branch block     Past Surgical  History:  Procedure Laterality Date  . APPENDECTOMY    . CATARACT EXTRACTION Bilateral    OU about 10 yrs ago  . EYE SURGERY    . fibroadenoma left breast removal    . OTHER SURGICAL HISTORY     appendectomy  . TOTAL HIP ARTHROPLASTY Left 06/18/2016   Procedure: LEFT TOTAL HIP ARTHROPLASTY ANTERIOR APPROACH;  Surgeon: Gaynelle Arabian, MD;  Location: [facility];  Service: Orthopedics;  Laterality: Left;  . TOTAL HIP ARTHROPLASTY Right 05/05/2018   Procedure: TOTAL HIP ARTHROPLASTY ANTERIOR APPROACH;  Surgeon: Gaynelle Arabian, MD;  Location: [facility];  Service: Orthopedics;  Laterality: Right;  . UTERINE FIBROID SURGERY      MEDICATIONS: . Cyanocobalamin (B-12) 2500 MCG TABS  . hydrochlorothiazide (HYDRODIURIL) 12.5 MG tablet  . hydroxypropyl methylcellulose / hypromellose (ISOPTO TEARS / GONIOVISC) 2.5 % ophthalmic solution  . vitamin B-12 (CYANOCOBALAMIN) 500 MCG tablet   . Bevacizumab (AVASTIN) SOLN 1.25 mg  . Bevacizumab (AVASTIN) SOLN 1.25 mg  . Bevacizumab (AVASTIN) SOLN 1.25 mg  . Bevacizumab (AVASTIN) SOLN 1.25 mg  . Bevacizumab (AVASTIN) SOLN 1.25 mg  . Bevacizumab (AVASTIN) SOLN 1.25 mg     Konrad Felix, Hershal Coria [facility] Pre-Surgical Testing [phone]

## 2020-01-01 MED ORDER — BUPIVACAINE LIPOSOME 1.3 % IJ SUSP
20.0000 mL | Freq: Once | INTRAMUSCULAR | Status: DC
  Filled 2020-01-01: qty 20

## 2020-01-01 NOTE — H&P (Signed)
TOTAL KNEE ADMISSION H&P  Patient is being admitted for right total knee arthroplasty.  Subjective:  Chief Complaint:right knee pain.  HPI: Martha Richard, 86 y.o. female, has a history of pain and functional disability in the right knee due to arthritis and has failed non-surgical conservative treatments for greater than 12 weeks to includeNSAID's and/or analgesics and activity modification.  Onset of symptoms was gradual, starting 2 years ago with gradually worsening course since that time. The patient noted no past surgery on the right knee(s).  Patient currently rates pain in the right knee(s) at 8 out of 10 with activity. Patient has worsening of pain with activity and weight bearing and pain that interferes with activities of daily living.  Patient has evidence of joint space narrowing by imaging studies. There is no active infection.  Patient Active Problem List   Diagnosis Date Noted  . OA (osteoarthritis) of hip 06/18/2016  . Arrhythmia   . UNSPECIFIED ESSENTIAL HYPERTENSION 09/27/2008  . PALPITATIONS 09/27/2008   Past Medical History:  Diagnosis Date  . Arrhythmia    palpitations  . Arthritis   . Cancer (HCC)    squamous cell Left hand  . Complication of anesthesia    slow to wake up  . Depression    Lost a son to cancer  . Headache    hx of migraines  . Hypertension   . Hypertensive retinopathy    OU  . Left bundle branch block     Past Surgical History:  Procedure Laterality Date  . APPENDECTOMY    . CATARACT EXTRACTION Bilateral    OU about 10 yrs ago  . EYE SURGERY    . fibroadenoma left breast removal    . OTHER SURGICAL HISTORY     appendectomy  . TOTAL HIP ARTHROPLASTY Left 06/18/2016   Procedure: LEFT TOTAL HIP ARTHROPLASTY ANTERIOR APPROACH;  Surgeon: Gaynelle Arabian, MD;  Location[facility];  Service: Orthopedics;  Laterality: Left;  . TOTAL HIP ARTHROPLASTY Right 05/05/2018   Procedure: TOTAL HIP ARTHROPLASTY ANTERIOR APPROACH;  Surgeon: Gaynelle Arabian,  MD;  Location: [facility];  Service: Orthopedics;  Laterality: Right;  . UTERINE FIBROID SURGERY      Current Facility-Administered Medications  Medication Dose Route Frequency Provider Last Rate Last Admin  . Bevacizumab (AVASTIN) SOLN 1.25 mg  1.25 mg Intravitreal  Bernarda Caffey, MD   1.25 mg at 04/20/17 1138  . Bevacizumab (AVASTIN) SOLN 1.25 mg  1.25 mg Intravitreal  Bernarda Caffey, MD   1.25 mg at 04/20/17 1455  . Bevacizumab (AVASTIN) SOLN 1.25 mg  1.25 mg Intravitreal  Bernarda Caffey, MD   1.25 mg at 05/25/17 1345  . Bevacizumab (AVASTIN) SOLN 1.25 mg  1.25 mg Intravitreal  Bernarda Caffey, MD   1.25 mg at 06/30/17 1351  . Bevacizumab (AVASTIN) SOLN 1.25 mg  1.25 mg Intravitreal  Bernarda Caffey, MD   1.25 mg at 08/23/17 0034  . Bevacizumab (AVASTIN) SOLN 1.25 mg  1.25 mg Intravitreal  Bernarda Caffey, MD   1.25 mg at 04/16/18 1349  . [START ON 01/02/2020] bupivacaine liposome (EXPAREL) 1.3 % injection 266 mg  20 mL Other Once Gaynelle Arabian, MD       Current Outpatient Medications  Medication Sig Dispense Refill Last Dose  . Cyanocobalamin (B-12) 2500 MCG TABS Take 2,500 mcg by mouth daily.     . hydrochlorothiazide (HYDRODIURIL) 12.5 MG tablet Take 12.5 mg by mouth daily as needed (salt intake).      . hydroxypropyl methylcellulose /  hypromellose (ISOPTO TEARS / GONIOVISC) 2.5 % ophthalmic solution Place 1 drop into both eyes 3 (three) times daily as needed for dry eyes.     . vitamin B-12 (CYANOCOBALAMIN) 500 MCG tablet Take 500 mcg by mouth 2 (two) times a week.  (Patient not taking: Reported on 12/20/2019)   Not Taking at Unknown time   Allergies  Allergen Reactions  . Ace Inhibitors Cough  . Caffeine Other (See Comments)    "shoots my BP up"  . Nsaids Other (See Comments)    GI upset    . Peanut-Containing Drug Products Other (See Comments)    headaches  . Ultram [Tramadol Hcl] Other (See Comments)    Headaches. Patient is unsure of this.  . Penicillins Itching and Rash    Has  patient had a PCN reaction causing immediate rash, facial/tongue/throat swelling, SOB or lightheadedness with hypotension: No Has patient had a PCN reaction causing severe rash involving mucus membranes or skin necrosis: No Has patient had a PCN reaction that required hospitalization No Has patient had a PCN reaction occurring within the last 10 years: No If all of the above answers are "NO", then may proceed with Cephalosporin use.     Social History   Tobacco Use  . Smoking status: Former Research scientist (life sciences)  . Smokeless tobacco: Never Used  . Tobacco comment: years ago 50 plus years  Substance Use Topics  . Alcohol use: No    Family History  Problem Relation Age of Onset  . Heart disease Mother   . Heart attack Mother   . Heart failure Mother   . Stroke Father   . Heart attack Maternal Aunt   . Heart disease Maternal Aunt   . Heart failure Maternal Aunt   . Amblyopia Neg Hx   . Blindness Neg Hx   . Cataracts Neg Hx   . Glaucoma Neg Hx   . Macular degeneration Neg Hx   . Retinal detachment Neg Hx   . Strabismus Neg Hx   . Retinitis pigmentosa Neg Hx      Review of Systems  Constitutional: Negative for chills and fever.  Respiratory: Negative for cough and shortness of breath.   Cardiovascular: Negative for chest pain.  Gastrointestinal: Negative for nausea and vomiting.  Musculoskeletal: Positive for arthralgias.    Objective:  Physical Exam Patient is an 86 year old female.  Well nourished and well developed. General: Alert and oriented x3, cooperative and pleasant, no acute distress. Head: normocephalic, atraumatic, neck supple. Eyes: EOMI. Respiratory: breath sounds clear in all fields, no wheezing, rales, or rhonchi. Cardiovascular: Regular rate and rhythm, no murmurs, gallops or rubs. Abdomen: non-tender to palpation and soft, normoactive bowel sounds.  Musculoskeletal: Right Knee Exam: Significant valgus deformity of approximately 10 to 15 degrees. No effusion  present. No swelling present. The range of motion is: 5 to 125 degrees. Marked crepitus on range of motion of the knee. Positive lateral greater than medial joint line tenderness. The knee is stable. There is pseudo laxity correcting her towards neutral.   Calves soft and nontender. Motor function intact in LE. Strength 5/5 LE bilaterally. Neuro: Distal pulses 2+. Sensation to light touch intact in LE.  Vital signs in last 24 hours:    Labs:   Estimated body mass index is 26.16 kg/m as calculated from the following:   Height as of 12/27/19: 5\' 2"  (1.575 m).   Weight as of 12/27/19: 64.9 kg.   Imaging Review Plain radiographs demonstrate severe degenerative joint disease of  the right knee(s). The overall alignment isneutral. The bone quality appears to be adequate for age and reported activity level.   Assessment/Plan:  End stage arthritis, right knee   The patient history, physical examination, clinical judgment of the provider and imaging studies are consistent with end stage degenerative joint disease of the right knee(s) and total knee arthroplasty is deemed medically necessary. The treatment options including medical management, injection therapy arthroscopy and arthroplasty were discussed at length. The risks and benefits of total knee arthroplasty were presented and reviewed. The risks due to aseptic loosening, infection, stiffness, patella tracking problems, thromboembolic complications and other imponderables were discussed. The patient acknowledged the explanation, agreed to proceed with the plan and consent was signed. Patient is being admitted for inpatient treatment for surgery, pain control, PT, OT, prophylactic antibiotics, VTE prophylaxis, progressive ambulation and ADL's and discharge planning. The patient is planning to be discharged home.  Therapy Plans: outpatient therapy [facility] in [place] Disposition: Home with husband Planned DVT Prophylaxis: Xarelto  10mg  daily (allergy to ASA) DME needed: none PCP: Dr. Quintin Alto, clearance received Cardiologist: Dr. Bronson Ing TXA: IV Allergies: ASA - stomach irritation, PCN - rash, peanuts - headache Anesthesia Concerns: none BMI: 25.8 Not diabetic.  Other: Stomach irritation with pain meds.  Patient's anticipated LOS is less than 2 midnights, meeting these requirements: - Younger than 39 - Lives within 1 hour of care - Has a competent adult at home to recover with post-op recover - NO history of  - Chronic pain requiring opiods  - Diabetes  - Coronary Artery Disease  - Heart failure  - Heart attack  - Stroke  - DVT/VTE  - Cardiac arrhythmia  - Respiratory Failure/COPD  - Renal failure  - Anemia  - Advanced Liver disease   - Patient was instructed on what medications to stop prior to surgery. - Follow-up visit in 2 weeks with Dr. Wynelle Link - Begin physical therapy following surgery - Pre-operative lab work as pre-surgical testing - Prescriptions will be provided in hospital at time of discharge  Griffith Citron, PA-C Orthopedic Surgery [facility] [place] [phone]

## 2020-01-02 ENCOUNTER — Other Ambulatory Visit: Payer: Self-pay

## 2020-01-02 ENCOUNTER — Encounter (HOSPITAL_COMMUNITY)
Admission: RE | Disposition: A | Discharge status: Home / Self Care | Payer: Self-pay | Source: Home / Self Care | Attending: Orthopedic Surgery

## 2020-01-02 ENCOUNTER — Ambulatory Visit (HOSPITAL_COMMUNITY): Payer: Medicare Other | Admitting: Certified Registered"

## 2020-01-02 ENCOUNTER — Ambulatory Visit (HOSPITAL_COMMUNITY): Payer: Medicare Other | Admitting: Physician Assistant

## 2020-01-02 ENCOUNTER — Encounter (HOSPITAL_COMMUNITY): Payer: Self-pay | Admitting: Orthopedic Surgery

## 2020-01-02 ENCOUNTER — Ambulatory Visit (HOSPITAL_COMMUNITY)
Admission: RE | Admit: 2020-01-02 | Discharge: 2020-01-03 | Disposition: A | Discharge status: Home / Self Care | Payer: Medicare Other | Attending: Orthopedic Surgery | Admitting: Orthopedic Surgery

## 2020-01-02 DIAGNOSIS — G8918 Other acute postprocedural pain: Secondary | ICD-10-CM | POA: Diagnosis not present

## 2020-01-02 DIAGNOSIS — M179 Osteoarthritis of knee, unspecified: Secondary | ICD-10-CM | POA: Diagnosis present

## 2020-01-02 DIAGNOSIS — Z96643 Presence of artificial hip joint, bilateral: Secondary | ICD-10-CM | POA: Diagnosis not present

## 2020-01-02 DIAGNOSIS — M1711 Unilateral primary osteoarthritis, right knee: Secondary | ICD-10-CM | POA: Diagnosis present

## 2020-01-02 DIAGNOSIS — Z87891 Personal history of nicotine dependence: Secondary | ICD-10-CM | POA: Insufficient documentation

## 2020-01-02 DIAGNOSIS — M1710 Unilateral primary osteoarthritis, unspecified knee: Secondary | ICD-10-CM | POA: Diagnosis present

## 2020-01-02 DIAGNOSIS — I10 Essential (primary) hypertension: Secondary | ICD-10-CM | POA: Diagnosis not present

## 2020-01-02 HISTORY — PX: TOTAL KNEE ARTHROPLASTY: SHX125

## 2020-01-02 LAB — TYPE AND SCREEN
ABO/RH(D): O POS
Antibody Screen: NEGATIVE
Unit division: 0
Unit division: 0

## 2020-01-02 LAB — BPAM RBC
Blood Product Expiration Date: 202112032359
Blood Product Expiration Date: 202112032359
Unit Type and Rh: 5100
Unit Type and Rh: 5100

## 2020-01-02 SURGERY — ARTHROPLASTY, KNEE, TOTAL
Anesthesia: Spinal | Site: Knee | Laterality: Right

## 2020-01-02 MED ORDER — FENTANYL CITRATE (PF) 100 MCG/2ML IJ SOLN: 25.0000 ug | INTRAMUSCULAR | Status: DC | PRN

## 2020-01-02 MED ORDER — BUPIVACAINE IN DEXTROSE 0.75-8.25 % IT SOLN
INTRATHECAL | Status: DC | PRN
  Administered 2020-01-02: 1.2 mL via INTRATHECAL

## 2020-01-02 MED ORDER — LACTATED RINGERS IV SOLN: INTRAVENOUS | Status: DC

## 2020-01-02 MED ORDER — MEPERIDINE HCL 50 MG/ML IJ SOLN: 6.2500 mg | INTRAMUSCULAR | Status: DC | PRN

## 2020-01-02 MED ORDER — ACETAMINOPHEN 325 MG PO TABS: 325.0000 mg | ORAL_TABLET | Freq: Four times a day (QID) | ORAL | Status: DC | PRN

## 2020-01-02 MED ORDER — SODIUM CHLORIDE (PF) 0.9 % IJ SOLN
INTRAMUSCULAR | Status: AC
  Filled 2020-01-02: qty 10

## 2020-01-02 MED ORDER — METOCLOPRAMIDE HCL 5 MG/ML IJ SOLN: 5.0000 mg | Freq: Three times a day (TID) | INTRAMUSCULAR | Status: DC | PRN

## 2020-01-02 MED ORDER — PHENYLEPHRINE HCL (PRESSORS) 10 MG/ML IV SOLN
INTRAVENOUS | Status: AC
  Filled 2020-01-02: qty 1

## 2020-01-02 MED ORDER — OXYCODONE HCL 5 MG PO TABS
10.0000 mg | ORAL_TABLET | ORAL | Status: DC | PRN
  Administered 2020-01-03 (×2): 10 mg via ORAL
  Filled 2020-01-02 (×2): qty 2

## 2020-01-02 MED ORDER — POLYETHYLENE GLYCOL 3350 17 G PO PACK: 17.0000 g | PACK | Freq: Every day | ORAL | Status: DC | PRN

## 2020-01-02 MED ORDER — PROPOFOL 1000 MG/100ML IV EMUL
INTRAVENOUS | Status: AC
  Filled 2020-01-02: qty 100

## 2020-01-02 MED ORDER — POVIDONE-IODINE 10 % EX SWAB
2.0000 "application " | Freq: Once | CUTANEOUS | Status: AC
  Administered 2020-01-02: 2 via TOPICAL

## 2020-01-02 MED ORDER — ACETAMINOPHEN 10 MG/ML IV SOLN: 1000.0000 mg | Freq: Once | INTRAVENOUS | Status: DC | PRN

## 2020-01-02 MED ORDER — BISACODYL 10 MG RE SUPP: 10.0000 mg | Freq: Every day | RECTAL | Status: DC | PRN

## 2020-01-02 MED ORDER — TRANEXAMIC ACID-NACL 1000-0.7 MG/100ML-% IV SOLN
1000.0000 mg | INTRAVENOUS | Status: AC
  Administered 2020-01-02: 1000 mg via INTRAVENOUS
  Filled 2020-01-02: qty 100

## 2020-01-02 MED ORDER — DEXAMETHASONE SODIUM PHOSPHATE 10 MG/ML IJ SOLN
8.0000 mg | Freq: Once | INTRAMUSCULAR | Status: AC
  Administered 2020-01-02: 8 mg via INTRAVENOUS

## 2020-01-02 MED ORDER — DOCUSATE SODIUM 100 MG PO CAPS
100.0000 mg | ORAL_CAPSULE | Freq: Two times a day (BID) | ORAL | Status: DC
  Administered 2020-01-02 – 2020-01-03 (×2): 100 mg via ORAL
  Filled 2020-01-02 (×2): qty 1

## 2020-01-02 MED ORDER — ACETAMINOPHEN 10 MG/ML IV SOLN
1000.0000 mg | Freq: Four times a day (QID) | INTRAVENOUS | Status: DC
  Administered 2020-01-02: 1000 mg via INTRAVENOUS
  Filled 2020-01-02: qty 100

## 2020-01-02 MED ORDER — ONDANSETRON HCL 4 MG/2ML IJ SOLN: 4.0000 mg | Freq: Four times a day (QID) | INTRAMUSCULAR | Status: DC | PRN

## 2020-01-02 MED ORDER — ROPIVACAINE HCL 5 MG/ML IJ SOLN
INTRAMUSCULAR | Status: DC | PRN
  Administered 2020-01-02 (×2): 5 mL via PERINEURAL

## 2020-01-02 MED ORDER — ORAL CARE MOUTH RINSE: 15.0000 mL | Freq: Once | OROMUCOSAL | Status: DC

## 2020-01-02 MED ORDER — ORAL CARE MOUTH RINSE: 15.0000 mL | Freq: Once | OROMUCOSAL | Status: AC

## 2020-01-02 MED ORDER — CHLORHEXIDINE GLUCONATE 0.12 % MT SOLN
15.0000 mL | Freq: Once | OROMUCOSAL | Status: AC
  Administered 2020-01-02: 15 mL via OROMUCOSAL

## 2020-01-02 MED ORDER — PHENYLEPHRINE HCL (PRESSORS) 10 MG/ML IV SOLN
INTRAVENOUS | Status: DC | PRN
  Administered 2020-01-02 (×3): 80 ug via INTRAVENOUS

## 2020-01-02 MED ORDER — PROPOFOL 10 MG/ML IV BOLUS
INTRAVENOUS | Status: DC | PRN
  Administered 2020-01-02 (×2): 5 mg via INTRAVENOUS
  Administered 2020-01-02 (×2): 20 mg via INTRAVENOUS
  Administered 2020-01-02: 5 mg via INTRAVENOUS

## 2020-01-02 MED ORDER — FLEET ENEMA 7-19 GM/118ML RE ENEM: 1.0000 | ENEMA | Freq: Once | RECTAL | Status: DC | PRN

## 2020-01-02 MED ORDER — CLONIDINE HCL (ANALGESIA) 100 MCG/ML EP SOLN
EPIDURAL | Status: DC | PRN
  Administered 2020-01-02: 50 ug

## 2020-01-02 MED ORDER — ONDANSETRON HCL 4 MG PO TABS: 4.0000 mg | ORAL_TABLET | Freq: Four times a day (QID) | ORAL | Status: DC | PRN

## 2020-01-02 MED ORDER — DIPHENHYDRAMINE HCL 12.5 MG/5ML PO ELIX: 12.5000 mg | ORAL_SOLUTION | ORAL | Status: DC | PRN

## 2020-01-02 MED ORDER — CEFAZOLIN SODIUM-DEXTROSE 2-4 GM/100ML-% IV SOLN
2.0000 g | INTRAVENOUS | Status: AC
  Administered 2020-01-02: 2 g via INTRAVENOUS
  Filled 2020-01-02: qty 100

## 2020-01-02 MED ORDER — MORPHINE SULFATE (PF) 2 MG/ML IV SOLN: 0.5000 mg | INTRAVENOUS | Status: DC | PRN

## 2020-01-02 MED ORDER — ONDANSETRON HCL 4 MG/2ML IJ SOLN: 4.0000 mg | Freq: Once | INTRAMUSCULAR | Status: DC | PRN

## 2020-01-02 MED ORDER — DEXMEDETOMIDINE (PRECEDEX) IN NS 20 MCG/5ML (4 MCG/ML) IV SYRINGE
PREFILLED_SYRINGE | INTRAVENOUS | Status: DC | PRN
  Administered 2020-01-02: 4 ug via INTRAVENOUS

## 2020-01-02 MED ORDER — ONDANSETRON HCL 4 MG/2ML IJ SOLN
INTRAMUSCULAR | Status: AC
  Filled 2020-01-02: qty 2

## 2020-01-02 MED ORDER — CEFAZOLIN SODIUM-DEXTROSE 2-4 GM/100ML-% IV SOLN
2.0000 g | Freq: Four times a day (QID) | INTRAVENOUS | Status: AC
  Administered 2020-01-02 – 2020-01-03 (×2): 2 g via INTRAVENOUS
  Filled 2020-01-02 (×2): qty 100

## 2020-01-02 MED ORDER — ROPIVACAINE HCL 7.5 MG/ML IJ SOLN
INTRAMUSCULAR | Status: DC | PRN
  Administered 2020-01-02 (×4): 5 mL via PERINEURAL

## 2020-01-02 MED ORDER — HYPROMELLOSE (GONIOSCOPIC) 2.5 % OP SOLN
1.0000 [drp] | Freq: Three times a day (TID) | OPHTHALMIC | Status: DC | PRN
  Filled 2020-01-02: qty 15

## 2020-01-02 MED ORDER — SODIUM CHLORIDE (PF) 0.9 % IJ SOLN
INTRAMUSCULAR | Status: DC | PRN
  Administered 2020-01-02: 60 mL

## 2020-01-02 MED ORDER — PROPOFOL 500 MG/50ML IV EMUL
INTRAVENOUS | Status: DC | PRN
  Administered 2020-01-02: 25 ug/kg/min via INTRAVENOUS

## 2020-01-02 MED ORDER — OXYCODONE HCL 5 MG PO TABS
5.0000 mg | ORAL_TABLET | ORAL | Status: DC | PRN
  Administered 2020-01-02 – 2020-01-03 (×3): 5 mg via ORAL
  Filled 2020-01-02 (×3): qty 1

## 2020-01-02 MED ORDER — 0.9 % SODIUM CHLORIDE (POUR BTL) OPTIME
TOPICAL | Status: DC | PRN
  Administered 2020-01-02: 1000 mL

## 2020-01-02 MED ORDER — DEXMEDETOMIDINE (PRECEDEX) IN NS 20 MCG/5ML (4 MCG/ML) IV SYRINGE
PREFILLED_SYRINGE | INTRAVENOUS | Status: AC
  Filled 2020-01-02: qty 5

## 2020-01-02 MED ORDER — METOCLOPRAMIDE HCL 5 MG PO TABS: 5.0000 mg | ORAL_TABLET | Freq: Three times a day (TID) | ORAL | Status: DC | PRN

## 2020-01-02 MED ORDER — FENTANYL CITRATE (PF) 100 MCG/2ML IJ SOLN
50.0000 ug | INTRAMUSCULAR | Status: DC
  Administered 2020-01-02: 100 ug via INTRAVENOUS
  Filled 2020-01-02: qty 2

## 2020-01-02 MED ORDER — DEXAMETHASONE SODIUM PHOSPHATE 10 MG/ML IJ SOLN
10.0000 mg | Freq: Once | INTRAMUSCULAR | Status: AC
  Administered 2020-01-03: 10 mg via INTRAVENOUS
  Filled 2020-01-02: qty 1

## 2020-01-02 MED ORDER — RIVAROXABAN 10 MG PO TABS
10.0000 mg | ORAL_TABLET | Freq: Every day | ORAL | Status: DC
  Administered 2020-01-03: 10 mg via ORAL
  Filled 2020-01-02: qty 1

## 2020-01-02 MED ORDER — MIDAZOLAM HCL 2 MG/2ML IJ SOLN
1.0000 mg | INTRAMUSCULAR | Status: DC
  Filled 2020-01-02: qty 2

## 2020-01-02 MED ORDER — CHLORHEXIDINE GLUCONATE 0.12 % MT SOLN: 15.0000 mL | Freq: Once | OROMUCOSAL | Status: DC

## 2020-01-02 MED ORDER — PANTOPRAZOLE SODIUM 40 MG PO TBEC
40.0000 mg | DELAYED_RELEASE_TABLET | Freq: Every day | ORAL | Status: DC
  Administered 2020-01-02 – 2020-01-03 (×2): 40 mg via ORAL
  Filled 2020-01-02 (×2): qty 1

## 2020-01-02 MED ORDER — SODIUM CHLORIDE 0.9 % IR SOLN
Status: DC | PRN
  Administered 2020-01-02: 1000 mL

## 2020-01-02 MED ORDER — METHOCARBAMOL 500 MG IVPB - SIMPLE MED
500.0000 mg | Freq: Four times a day (QID) | INTRAVENOUS | Status: DC | PRN
  Filled 2020-01-02: qty 50

## 2020-01-02 MED ORDER — PHENOL 1.4 % MT LIQD: 1.0000 | OROMUCOSAL | Status: DC | PRN

## 2020-01-02 MED ORDER — STERILE WATER FOR IRRIGATION IR SOLN
Status: DC | PRN
  Administered 2020-01-02: 1000 mL

## 2020-01-02 MED ORDER — SODIUM CHLORIDE 0.9 % IV SOLN: INTRAVENOUS | Status: DC

## 2020-01-02 MED ORDER — SODIUM CHLORIDE (PF) 0.9 % IJ SOLN
INTRAMUSCULAR | Status: AC
  Filled 2020-01-02: qty 50

## 2020-01-02 MED ORDER — PHENYLEPHRINE HCL-NACL 10-0.9 MG/250ML-% IV SOLN
INTRAVENOUS | Status: DC | PRN
  Administered 2020-01-02: 25 ug/min via INTRAVENOUS
  Administered 2020-01-02: 10 ug/min via INTRAVENOUS

## 2020-01-02 MED ORDER — BUPIVACAINE LIPOSOME 1.3 % IJ SUSP
INTRAMUSCULAR | Status: DC | PRN
  Administered 2020-01-02: 20 mL

## 2020-01-02 MED ORDER — HYDRALAZINE HCL 20 MG/ML IJ SOLN
10.0000 mg | Freq: Four times a day (QID) | INTRAMUSCULAR | Status: DC | PRN
  Administered 2020-01-03 (×2): 10 mg via INTRAVENOUS
  Filled 2020-01-02 (×2): qty 1

## 2020-01-02 MED ORDER — MENTHOL 3 MG MT LOZG: 1.0000 | LOZENGE | OROMUCOSAL | Status: DC | PRN

## 2020-01-02 MED ORDER — METHOCARBAMOL 500 MG PO TABS
500.0000 mg | ORAL_TABLET | Freq: Four times a day (QID) | ORAL | Status: DC | PRN
  Administered 2020-01-02 – 2020-01-03 (×2): 500 mg via ORAL
  Filled 2020-01-02 (×2): qty 1

## 2020-01-02 MED ORDER — DEXAMETHASONE SODIUM PHOSPHATE 10 MG/ML IJ SOLN
INTRAMUSCULAR | Status: AC
  Filled 2020-01-02: qty 1

## 2020-01-02 SURGICAL SUPPLY — 55 items
BAG ZIPLOCK 12X15 (MISCELLANEOUS) ×2 IMPLANT
BLADE SAG 18X100X1.27 (BLADE) ×2 IMPLANT
BLADE SAW SGTL 11.0X1.19X90.0M (BLADE) ×2 IMPLANT
BLADE SURG SZ10 CARB STEEL (BLADE) ×4 IMPLANT
BNDG ELASTIC 6X10 VLCR STRL LF (GAUZE/BANDAGES/DRESSINGS) ×2 IMPLANT
BNDG ELASTIC 6X5.8 VLCR STR LF (GAUZE/BANDAGES/DRESSINGS) ×2 IMPLANT
BOWL SMART MIX CTS (DISPOSABLE) ×2 IMPLANT
CEMENT HV SMART SET (Cement) ×4 IMPLANT
CEMENT TIBIA MBT SIZE 2.5 (Knees) ×1 IMPLANT
CLSR STERI-STRIP ANTIMIC 1/2X4 (GAUZE/BANDAGES/DRESSINGS) ×2 IMPLANT
COVER SURGICAL LIGHT HANDLE (MISCELLANEOUS) ×2 IMPLANT
COVER WAND RF STERILE (DRAPES) IMPLANT
CUFF TOURN SGL QUICK 34 (TOURNIQUET CUFF) ×2
CUFF TRNQT CYL 34X4.125X (TOURNIQUET CUFF) ×1 IMPLANT
DECANTER SPIKE VIAL GLASS SM (MISCELLANEOUS) ×2 IMPLANT
DRAPE U-SHAPE 47X51 STRL (DRAPES) ×2 IMPLANT
DRSG AQUACEL AG ADV 3.5X10 (GAUZE/BANDAGES/DRESSINGS) ×2 IMPLANT
DURAPREP 26ML APPLICATOR (WOUND CARE) ×2 IMPLANT
ELECT REM PT RETURN 15FT ADLT (MISCELLANEOUS) ×2 IMPLANT
FEMUR SIGMA PS SZ 3.0 R (Femur) ×2 IMPLANT
GLOVE BIO SURGEON STRL SZ7 (GLOVE) ×2 IMPLANT
GLOVE BIO SURGEON STRL SZ8 (GLOVE) ×2 IMPLANT
GLOVE BIOGEL PI IND STRL 7.0 (GLOVE) ×1 IMPLANT
GLOVE BIOGEL PI IND STRL 8 (GLOVE) ×1 IMPLANT
GLOVE BIOGEL PI INDICATOR 7.0 (GLOVE) ×1
GLOVE BIOGEL PI INDICATOR 8 (GLOVE) ×1
GOWN STRL REUS W/TWL LRG LVL3 (GOWN DISPOSABLE) ×4 IMPLANT
HANDPIECE INTERPULSE COAX TIP (DISPOSABLE) ×2
HOLDER FOLEY CATH W/STRAP (MISCELLANEOUS) IMPLANT
IMMOBILIZER KNEE 20 (SOFTGOODS) ×2
IMMOBILIZER KNEE 20 THIGH 36 (SOFTGOODS) ×1 IMPLANT
INSERT TIBIAL PFC SIG SZ3 10MM (Knees) ×2 IMPLANT
KIT TURNOVER KIT A (KITS) IMPLANT
MANIFOLD NEPTUNE II (INSTRUMENTS) ×2 IMPLANT
NS IRRIG 1000ML POUR BTL (IV SOLUTION) ×2 IMPLANT
PACK TOTAL KNEE CUSTOM (KITS) ×2 IMPLANT
PADDING CAST ABS 6INX4YD NS (CAST SUPPLIES) ×1
PADDING CAST ABS COTTON 6X4 NS (CAST SUPPLIES) ×1 IMPLANT
PADDING CAST COTTON 6X4 STRL (CAST SUPPLIES) ×4 IMPLANT
PATELLA DOME PFC 35MM (Knees) ×2 IMPLANT
PENCIL SMOKE EVACUATOR (MISCELLANEOUS) ×2 IMPLANT
PIN FIX SIGMA LCS THRD HI (PIN) ×2 IMPLANT
PIN STEINMAN FIXATION KNEE (PIN) ×2 IMPLANT
PROTECTOR NERVE ULNAR (MISCELLANEOUS) ×2 IMPLANT
SET HNDPC FAN SPRY TIP SCT (DISPOSABLE) ×1 IMPLANT
STRIP CLOSURE SKIN 1/2X4 (GAUZE/BANDAGES/DRESSINGS) ×4 IMPLANT
SUT MNCRL AB 4-0 PS2 18 (SUTURE) ×2 IMPLANT
SUT STRATAFIX 0 PDS 27 VIOLET (SUTURE) ×2
SUT VIC AB 2-0 CT1 27 (SUTURE) ×6
SUT VIC AB 2-0 CT1 TAPERPNT 27 (SUTURE) ×3 IMPLANT
SUTURE STRATFX 0 PDS 27 VIOLET (SUTURE) ×1 IMPLANT
TIBIA MBT CEMENT SIZE 2.5 (Knees) ×2 IMPLANT
TRAY FOLEY MTR SLVR 16FR STAT (SET/KITS/TRAYS/PACK) ×2 IMPLANT
WATER STERILE IRR 1000ML POUR (IV SOLUTION) ×4 IMPLANT
WRAP KNEE MAXI GEL POST OP (GAUZE/BANDAGES/DRESSINGS) ×2 IMPLANT

## 2020-01-02 NOTE — Discharge Instructions (Addendum)
Information on my medicine - XARELTO (Rivaroxaban)  This medication education was reviewed with me or my healthcare representative as part of my discharge preparation.  Why was Xarelto prescribed for you? Xarelto was prescribed for you to reduce the risk of blood clots forming after orthopedic surgery. The medical term for these abnormal blood clots is venous thromboembolism (VTE).  What do you need to know about xarelto ? Take your Xarelto ONCE DAILY at the same time every day. You may take it either with or without food.  If you have difficulty swallowing the tablet whole, you may crush it and mix in applesauce just prior to taking your dose.  Take Xarelto exactly as prescribed by your doctor and DO NOT stop taking Xarelto without talking to the doctor who prescribed the medication.  Stopping without other VTE prevention medication to take the place of Xarelto may increase your risk of developing a clot.  After discharge, you should have regular check-up appointments with your healthcare provider that is prescribing your Xarelto.    What do you do if you miss a dose? If you miss a dose, take it as soon as you remember on the same day then continue your regularly scheduled once daily regimen the next day. Do not take two doses of Xarelto on the same day.   Important Safety Information A possible side effect of Xarelto is bleeding. You should call your healthcare provider right away if you experience any of the following: ? Bleeding from an injury or your nose that does not stop. ? Unusual colored urine (red or dark brown) or unusual colored stools (red or black). ? Unusual bruising for unknown reasons. ? A serious fall or if you hit your head (even if there is no bleeding).  Some medicines may interact with Xarelto and might increase your risk of bleeding while on Xarelto. To help avoid this, consult your healthcare provider or pharmacist prior to using any new prescription or  non-prescription medications, including herbals, vitamins, non-steroidal anti-inflammatory drugs (NSAIDs) and supplements.  This website has more information on Xarelto: [email or web address]-[email or web address].[email or web address]     Gaynelle Arabian, MD Total Joint Specialist [facility] [address] [place], [place] [address] [phone]  TOTAL KNEE REPLACEMENT POSTOPERATIVE DIRECTIONS    Knee Rehabilitation, Guidelines Following Surgery  Results after knee surgery are often greatly improved when you follow the exercise, range of motion and muscle strengthening exercises prescribed by your doctor. Safety measures are also important to protect the knee from further injury. If any of these exercises cause you to have increased pain or swelling in your knee joint, decrease the amount until you are comfortable again and slowly increase them. If you have problems or questions, call your caregiver or physical therapist for advice.   BLOOD CLOT PREVENTION . Take a 10 mg Xarelto once a day for three weeks following surgery.  . You may resume your vitamins/supplements once you have discontinued the Xarelto. . Do not take any NSAIDs (Advil, Aleve, Ibuprofen, Meloxicam, etc.) until you have discontinued the Xarelto.   HOME CARE INSTRUCTIONS  . Remove items at home which could result in a fall. This includes throw rugs or furniture in walking pathways.  . ICE to the affected knee as much as tolerated. Icing helps control swelling. If the swelling is well controlled you will be more comfortable and rehab easier. Continue to use ice on the knee for pain and swelling from surgery. You may notice swelling that will progress down  to the foot and ankle. This is normal after surgery. Elevate the leg when you are not up walking on it.    . Continue to use the breathing machine which will help keep your temperature down. It is common for your temperature to cycle up and down following surgery, especially at night when you  are not up moving around and exerting yourself. The breathing machine keeps your lungs expanded and your temperature down. . Do not place pillow under the operative knee, focus on keeping the knee straight while resting  DIET You may resume your previous home diet once you are discharged from the hospital.  DRESSING / [place] / SHOWERING . Keep your bulky bandage on for 2 days. On the third post-operative day you may remove the Ace bandage and gauze. There is a waterproof adhesive bandage on your skin which will stay in place until your first follow-up appointment. Once you remove this you will not need to place another bandage . You may begin showering 3 days following surgery, but do not submerge the incision under water.  ACTIVITY For the first 5 days, the key is rest and control of pain and swelling . Do your home exercises twice a day starting on post-operative day 3. On the days you go to physical therapy, just do the home exercises once that day. . You should rest, ice and elevate the leg for 50 minutes out of every hour. Get up and walk/stretch for 10 minutes per hour. After 5 days you can increase your activity slowly as tolerated. . Walk with your walker as instructed. Use the walker until you are comfortable transitioning to a cane. Walk with the cane in the opposite hand of the operative leg. You may discontinue the cane once you are comfortable and walking steadily. . Avoid periods of inactivity such as sitting longer than an hour when not asleep. This helps prevent blood clots.  . You may discontinue the knee immobilizer once you are able to perform a straight leg raise while lying down. . You may resume a sexual relationship in one month or when given the OK by your doctor.  . You may return to work once you are cleared by your doctor.  . Do not drive a car for 6 weeks or until released by your surgeon.  . Do not drive while taking narcotics.  TED HOSE STOCKINGS Wear the  elastic stockings on both legs for three weeks following surgery during the day. You may remove them at night for sleeping.  WEIGHT BEARING Weight bearing as tolerated with assist device (walker, cane, etc) as directed, use it as long as suggested by your surgeon or therapist, typically at least 4-6 weeks.  POSTOPERATIVE CONSTIPATION PROTOCOL Constipation - defined medically as fewer than three stools per week and severe constipation as less than one stool per week.  One of the most common issues patients have following surgery is constipation.  Even if you have a regular bowel pattern at home, your normal regimen is likely to be disrupted due to multiple reasons following surgery.  Combination of anesthesia, postoperative narcotics, change in appetite and fluid intake all can affect your bowels.  In order to avoid complications following surgery, here are some recommendations in order to help you during your recovery period.  . Colace (docusate) - Pick up an over-the-counter form of Colace or another stool softener and take twice a day as long as you are requiring postoperative pain medications.  Take with  a full glass of water daily.  If you experience loose stools or diarrhea, hold the colace until you stool forms back up. If your symptoms do not get better within 1 week or if they get worse, check with your doctor. . Dulcolax (bisacodyl) - Pick up over-the-counter and take as directed by the product packaging as needed to assist with the movement of your bowels.  Take with a full glass of water.  Use this product as needed if not relieved by Colace only.  . MiraLax (polyethylene glycol) - Pick up over-the-counter to have on hand. MiraLax is a solution that will increase the amount of water in your bowels to assist with bowel movements.  Take as directed and can mix with a glass of water, juice, soda, coffee, or tea. Take if you go more than two days without a movement. Do not use MiraLax more than  once per day. Call your doctor if you are still constipated or irregular after using this medication for 7 days in a row.  If you continue to have problems with postoperative constipation, please contact the office for further assistance and recommendations.  If you experience "the worst abdominal pain ever" or develop nausea or vomiting, please contact the office immediatly for further recommendations for treatment.  ITCHING If you experience itching with your medications, try taking only a single pain pill, or even half a pain pill at a time.  You can also use Benadryl over the counter for itching or also to help with sleep.   MEDICATIONS See your medication summary on the "After Visit Summary" that the nursing staff will review with you prior to discharge.  You may have some home medications which will be placed on hold until you complete the course of blood thinner medication.  It is important for you to complete the blood thinner medication as prescribed by your surgeon.  Continue your approved medications as instructed at time of discharge.  PRECAUTIONS . If you experience chest pain or shortness of breath - call 911 immediately for transfer to the hospital emergency department.  . If you develop a fever greater that 101 F, purulent drainage from wound, increased redness or drainage from wound, foul odor from the wound/dressing, or calf pain - CONTACT YOUR SURGEON.                                                   FOLLOW-UP APPOINTMENTS Make sure you keep all of your appointments after your operation with your surgeon and caregivers. You should call the office at the above phone number and make an appointment for approximately two weeks after the date of your surgery or on the date instructed by your surgeon outlined in the "After Visit Summary".  RANGE OF MOTION AND STRENGTHENING EXERCISES  Rehabilitation of the knee is important following a knee injury or an operation. After just a few days  of immobilization, the muscles of the thigh which control the knee become weakened and shrink (atrophy). Knee exercises are designed to build up the tone and strength of the thigh muscles and to improve knee motion. Often times heat used for twenty to thirty minutes before working out will loosen up your tissues and help with improving the range of motion but do not use heat for the first two weeks following surgery. These exercises can  be done on a training (exercise) mat, on the floor, on a table or on a bed. Use what ever works the best and is most comfortable for you Knee exercises include:  . Leg Lifts - While your knee is still immobilized in a splint or cast, you can do straight leg raises. Lift the leg to 60 degrees, hold for 3 sec, and slowly lower the leg. Repeat 10-20 times 2-3 times daily. Perform this exercise against resistance later as your knee gets better.  Javier Docker and Hamstring Sets - Tighten up the muscle on the front of the thigh (Quad) and hold for 5-10 sec. Repeat this 10-20 times hourly. Hamstring sets are done by pushing the foot backward against an object and holding for 5-10 sec. Repeat as with quad sets.   Leg Slides: Lying on your back, slowly slide your foot toward your buttocks, bending your knee up off the floor (only go as far as is comfortable). Then slowly slide your foot back down until your leg is flat on the floor again.  Angel Wings: Lying on your back spread your legs to the side as far apart as you can without causing discomfort.  A rehabilitation program following serious knee injuries can speed recovery and prevent re-injury in the future due to weakened muscles. Contact your doctor or a physical therapist for more information on knee rehabilitation.   IF YOU ARE TRANSFERRED TO A SKILLED REHAB FACILITY If the patient is transferred to a skilled rehab facility following release from the hospital, a list of the current medications will be sent to the facility for the  patient to continue.  When discharged from the skilled rehab facility, please have the facility set up the patient's Protivin prior to being released. Also, the skilled facility will be responsible for providing the patient with their medications at time of release from the facility to include their pain medication, the muscle relaxants, and their blood thinner medication. If the patient is still at the rehab facility at time of the two week follow up appointment, the skilled rehab facility will also need to assist the patient in arranging follow up appointment in our office and any transportation needs.  MAKE SURE YOU:  . Understand these instructions.  . Get help right away if you are not doing well or get worse.   DENTAL ANTIBIOTICS:  In most cases prophylactic antibiotics for Dental procdeures after total joint surgery are not necessary.  Exceptions are as follows:  1. History of prior total joint infection  2. Severely immunocompromised (Organ Transplant, cancer chemotherapy, Rheumatoid biologic meds such as [place])  3. Poorly controlled diabetes (A1C &gt; 8.0, blood glucose over 200)  If you have one of these conditions, contact your surgeon for an antibiotic prescription, prior to your dental procedure.    Pick up stool softner and laxative for home use following surgery while on pain medications. Do not submerge incision under water. Please use good hand washing techniques while changing dressing each day. May shower starting three days after surgery. Please use a clean towel to pat the incision dry following showers. Continue to use ice for pain and swelling after surgery. Do not use any lotions or creams on the incision until instructed by your surgeon.

## 2020-01-02 NOTE — Interval H&P Note (Signed)
History and Physical Interval Note:  01/02/2020 8:44 AM  Martha Richard  has presented today for surgery, with the diagnosis of right knee osteoarthritis.  The various methods of treatment have been discussed with the patient and family. After consideration of risks, benefits and other options for treatment, the patient has consented to  Procedure(s) with comments: TOTAL KNEE ARTHROPLASTY (Right) - 3min as a surgical intervention.  The patient's history has been reviewed, patient examined, no change in status, stable for surgery.  I have reviewed the patient's chart and labs.  Questions were answered to the patient's satisfaction.     Pilar Plate Camy Leder

## 2020-01-02 NOTE — Anesthesia Procedure Notes (Signed)
Anesthesia Regional Block: Adductor canal block   Pre-Anesthetic Checklist: ,, timeout performed, Correct Patient, Correct Site, Correct Laterality, Correct Procedure, Correct Position, site marked, Risks and benefits discussed,  Surgical consent,  Pre-op evaluation,  At surgeon's request and post-op pain management  Laterality: Lower and Right  Prep: chloraprep       Needles:  Injection technique: Single-shot  Needle Type: Echogenic Stimulator Needle     Needle Length: 9cm  Needle Gauge: 20   Needle insertion depth: 3 cm   Additional Needles:   Procedures:,,,, ultrasound used (permanent image in chart),,,,  Narrative:  Start time: 01/02/2020 10:15 AM End time: 01/02/2020 10:23 AM Injection made incrementally with aspirations every 5 mL.  Performed by: Personally  Anesthesiologist: Lyn Hollingshead, MD

## 2020-01-02 NOTE — Progress Notes (Signed)
Notified [place] PA-C with Dr. Wynelle Link that patient SBP is 180.

## 2020-01-02 NOTE — Transfer of Care (Signed)
Immediate Anesthesia Transfer of Care Note  Patient: Martha Richard  Procedure(s) Performed: TOTAL KNEE ARTHROPLASTY (Right Knee)  Patient Location: PACU  Anesthesia Type:MAC and Spinal  Level of Consciousness: awake, alert , oriented and patient cooperative  Airway & Oxygen Therapy: Patient Spontanous Breathing  Post-op Assessment: Report given to RN and Post -op Vital signs reviewed and stable  Post vital signs: Reviewed and stable  Last Vitals:  Vitals Value Taken Time  BP 127/59 01/02/20 1150  Temp    Pulse 73 01/02/20 1152  Resp 18 01/02/20 1152  SpO2 98 % 01/02/20 1152  Vitals shown include unvalidated device data.  Last Pain:  Vitals:   01/02/20 0841  TempSrc: Oral      Patients Stated Pain Goal: 3 (57/01/77 9390)  Complications: No complications documented.

## 2020-01-02 NOTE — Progress Notes (Signed)
Orthopedic Tech Progress Note Patient Details:  Martha Richard [birth year] [id number]  CPM Right Knee CPM Right Knee: Off Right Knee Flexion (Degrees): 4 Right Knee Extension (Degrees): 10  Post Interventions Patient Tolerated: Well Instructions Provided: Care of device  [place] Pink 01/02/2020, 2:31 PM

## 2020-01-02 NOTE — Plan of Care (Signed)

## 2020-01-02 NOTE — Anesthesia Postprocedure Evaluation (Signed)
Anesthesia Post Note  Patient: Martha Richard  Procedure(s) Performed: TOTAL KNEE ARTHROPLASTY (Right Knee)     Patient location during evaluation: PACU Anesthesia Type: Spinal Level of consciousness: awake Pain management: pain level controlled Vital Signs Assessment: post-procedure vital signs reviewed and stable Respiratory status: spontaneous breathing Cardiovascular status: stable Postop Assessment: no headache, no backache, spinal receding, patient able to bend at knees and no apparent nausea or vomiting Anesthetic complications: no   No complications documented.  Last Vitals:  Vitals:   01/02/20 1215 01/02/20 1230  BP: (!) 137/59 (!) 151/64  Pulse: 68 72  Resp: 12 16  Temp:    SpO2: 100% 100%    Last Pain:  Vitals:   01/02/20 1215  TempSrc:   PainSc: 0-No pain                 Huston Foley

## 2020-01-02 NOTE — Evaluation (Signed)
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  Clinical Impression  Pt is s/p  R TKA resulting in the deficits listed below (see PT Problem List).  Pt will benefit from skilled PT to increase their independence and safety with mobility to allow discharge home with son assisting initially with husband home as well. Tolerated session well today.       Follow Up Recommendations Follow surgeon's recommendation for DC plan and follow-up therapies (pt stated HH first then to OPPT)    Equipment Recommendations  None recommended by PT    Recommendations for Other Services       Precautions / Restrictions Precautions Precautions: Knee Precaution Comments: educated on not putting pillow or towel roll under the R Knee to promote straight healing when resting Required Braces or Orthoses: Knee Immobilizer - Right Knee Immobilizer - Right: Discontinue once straight leg raise with < 10 degree lag;On when out of bed or walking Restrictions Weight Bearing Restrictions: No      Mobility  Bed Mobility Overal bed mobility: Needs Assistance Bed Mobility: Supine to Sit;Sit to Supine     Supine to sit: Min assist Sit to supine: HOB elevated   General bed mobility comments: slight assis twith R LE for getting EOB, and use of handrail    Transfers Overall transfer level: Needs assistance Equipment used: Rolling walker (2 wheeled) Transfers: Sit to/from Stand Sit to Stand: Min guard         General transfer comment: cues for RW safety and completing the turn before sitting for safety  Ambulation/Gait Ambulation/Gait assistance: Min assist Gait Distance (Feet): 20 Feet Assistive device: Rolling walker (2 wheeled) Gait Pattern/deviations: Step-to pattern     General Gait Details: R leg still wiht some  numbness even though able to perfomr SLR, R LE " had some give when attempted to step. Educated pt to do step to pattern and use UEs to support her weight. This is why I was a good MonA to be ready in case R LE buckled.  Stairs            Wheelchair Mobility    Modified Rankin (Stroke Patients Only)       Balance Overall balance assessment: Needs assistance Sitting-balance support: Bilateral upper extremity supported;Feet supported Sitting balance-Leahy Scale: Good     Standing balance support: Bilateral upper extremity supported;During functional activity Standing balance-Leahy Scale: Fair                               Pertinent Vitals/Pain Pain Assessment: No/denies pain    Home Living Family/patient expects to be discharged to:: Private residence Living Arrangements: Spouse/significant other (spouse is 39 you, so not much help. Son lives in [place] but staying this week with her to assist.) Available Help at Discharge: Family Type of Home: House Home Access: Stairs to enter Entrance Stairs-Rails: None Entrance Stairs-Number of Steps: 1 Home Layout: One level Home Equipment: Environmental consultant - 2 wheels;Cane - single point Additional Comments: Pt's son coming for week this week only to assist .    Prior Function Level of Independence: Independent         Comments: used cane occassionaly , but did all meals and grocery for her and her husband.  Hand Dominance        Extremity/Trunk Assessment        Lower Extremity Assessment Lower Extremity Assessment: RLE deficits/detail RLE Deficits / Details: grossly -5 from full extension, and was able to achieve 85 knee flexion with dangling EOB with no added stretch or pressure       Communication   Communication: HOH  Cognition Arousal/Alertness: Awake/alert Behavior During Therapy: WFL for tasks assessed/performed Overall Cognitive Status: Within Functional Limits for tasks assessed                                         General Comments      Exercises Total Joint Exercises Ankle Circles/Pumps: AROM;Both;10 reps;Supine Quad Sets: AROM;Right;10 reps;Supine Heel Slides: AAROM;Right;10 reps;Supine Straight Leg Raises: AAROM;Right;10 reps;Supine Goniometric ROM: 5-85 supine and sitting EOB   Assessment/Plan    PT Assessment Patient needs continued PT services  PT Problem List Decreased strength;Decreased mobility;Decreased range of motion;Decreased activity tolerance;Decreased knowledge of use of DME       PT Treatment Interventions Stair training;Gait training;DME instruction;Therapeutic exercise;Balance training;Functional mobility training;Therapeutic activities;Patient/family education    PT Goals (Current goals can be found in the Care Plan section)  Acute Rehab PT Goals Patient Stated Goal: i like to walk want to get back to it , and reading PT Goal Formulation: With patient Time For Goal Achievement: 01/16/20 Potential to Achieve Goals: Good    Frequency 7X/week   Barriers to discharge        Co-evaluation               AM-PAC PT "6 Clicks" Mobility  Outcome Measure Help needed turning from your back to your side while in a flat bed without using bedrails?: A Little Help needed moving from lying on your back to sitting on the side of a flat bed without using bedrails?: A Little Help needed moving to and from a bed to a chair (including a wheelchair)?: A Little Help needed standing up from a chair using your arms (e.g., wheelchair or bedside chair)?: A Little Help needed to walk in hospital room?: A Lot Help needed climbing 3-5 steps with a railing? : A Lot 6 Click Score: 16    End of Session Equipment Utilized During Treatment: Gait belt Activity Tolerance: Patient tolerated treatment well Patient left: in chair;with chair alarm set;with call bell/phone within reach Nurse Communication: Mobility status PT Visit Diagnosis: Other  abnormalities of gait and mobility (R26.89)    Time: 4287-6811 PT Time Calculation (min) (ACUTE ONLY): 28 min   Charges:   PT Evaluation $PT Eval Low Complexity: 1 Low PT Treatments $Gait Training: 8-22 mins        India Jolin, PT, MPT Acute Rehabilitation Services Office: [phone] Pager: [phone] 01/02/2020   Clide Dales 01/02/2020, 4:37 PM

## 2020-01-02 NOTE — Progress Notes (Signed)
Orthopedic Tech Progress Note Patient Details:  Martha Richard 05-28-1929 [id number]  CPM Right Knee CPM Right Knee: On Right Knee Flexion (Degrees): 4 Right Knee Extension (Degrees): 10  Post Interventions Patient Tolerated: Well Instructions Provided: Care of device Ortho Devices Ortho Device/Splint Location: applied overhead frame to bed Ortho Device/Splint Interventions: Ordered, Application, Adjustment   Post Interventions Patient Tolerated: Well Instructions Provided: Care of device   Braulio Bosch 01/02/2020, 12:15 PM

## 2020-01-02 NOTE — Care Plan (Signed)
Ortho Bundle Case Management Note  Patient Details  Name: Martha Richard MRN: [id number] Date of Birth: 1929/04/09                  R TKA on 01/02/20. DCP: Home with husband. 1 story home with 1 step. DME: No needs. Has RW & 3in1. PT: [facility]   DME Arranged:  N/A DME Agency:     HH Arranged:    [place] Agency:     Additional Comments: Please contact me with any questions of if this plan should need to change.  Marianne Sofia, RN,CCM [facility]  [phone] 01/02/2020, 12:21 PM

## 2020-01-02 NOTE — Anesthesia Preprocedure Evaluation (Addendum)
Anesthesia Evaluation  Patient identified by MRN, date of birth, ID band Patient awake    Reviewed: Allergy & Precautions, NPO status , Patient's Chart, lab work & pertinent test results  History of Anesthesia Complications (+) history of anesthetic complications ("Slow to wake up")  Airway Mallampati: II  TM Distance: >3 FB Neck ROM: Full    Dental  (+) Dental Advisory Given, Caps, Partial Upper, Partial Lower   Pulmonary former smoker,    Pulmonary exam normal breath sounds clear to auscultation       Cardiovascular hypertension, Pt. on medications + Valvular Problems/Murmurs AS  Rhythm:Regular Rate:Normal + Systolic murmurs    Neuro/Psych  Headaches,    GI/Hepatic negative GI ROS, Neg liver ROS,   Endo/Other  negative endocrine ROS  Renal/GU negative Renal ROS     Musculoskeletal  (+) Arthritis ,   Abdominal Normal abdominal exam  (+)   Peds  Hematology negative hematology ROS (+) Plt 281k   Anesthesia Other Findings Day of surgery medications reviewed with the patient.   06/24/2019 2:45 PM EDT   Pumping function is normal. Valve leakage is noted. Moderate aortic valve narrowing. I will monitor.      Reproductive/Obstetrics                            Anesthesia Physical  Anesthesia Plan  ASA: III  Anesthesia Plan: Spinal   Post-op Pain Management:  Regional for Post-op pain   Induction:   PONV Risk Score and Plan: 3 and Dexamethasone and Ondansetron  Airway Management Planned:   Additional Equipment: None  Intra-op Plan:   Post-operative Plan:   Informed Consent: I have reviewed the patients History and Physical, chart, labs and discussed the procedure including the risks, benefits and alternatives for the proposed anesthesia with the patient or authorized representative who has indicated his/her understanding and acceptance.       Plan Discussed with:  CRNA  Anesthesia Plan Comments: (See PAT note 04/30/18, Konrad Felix, PA-C)        Anesthesia Quick Evaluation

## 2020-01-02 NOTE — Progress Notes (Signed)
Assisted Dr. Hatchett with right, ultrasound guided, adductor canal block. Side rails up, monitors on throughout procedure. See vital signs in flow sheet. Tolerated Procedure well.  

## 2020-01-02 NOTE — Op Note (Signed)
OPERATIVE REPORT-TOTAL KNEE ARTHROPLASTY   Pre-operative diagnosis- Osteoarthritis  Right knee(s)  Post-operative diagnosis- Osteoarthritis Right knee(s)  Procedure-  Right  Total Knee Arthroplasty  Surgeon- Dione Plover. Falen Lehrmann, MD  Assistant- Molli Barrows, PA-C   Anesthesia-  Adductor canal block and spinal  EBL- 25 ml   Drains None  Tourniquet time-  Total Tourniquet Time Documented: Thigh (Right) - 30 minutes Total: Thigh (Right) - 30 minutes     Complications- None  Condition-PACU - hemodynamically stable.   Brief Clinical Note  Martha Richard is a 86 y.o. year old female with end stage OA of her right knee with progressively worsening pain and dysfunction. She has constant pain, with activity and at rest and significant functional deficits with difficulties even with ADLs. She has had extensive non-op management including analgesics, injections of cortisone and viscosupplements, and home exercise program, but remains in significant pain with significant dysfunction.Radiographs show bone on bone arthritis lateral and patellofemoral. She presents now for right Total Knee Arthroplasty.    Procedure in detail---   The patient is brought into the operating room and positioned supine on the operating table. After successful administration of  Adductor canal block and spinal,   a tourniquet is placed high on the  Right thigh(s) and the lower extremity is prepped and draped in the usual sterile fashion. Time out is performed by the operating team and then the  Right lower extremity is wrapped in Esmarch, knee flexed and the tourniquet inflated to 300 mmHg.       A midline incision is made with a ten blade through the subcutaneous tissue to the level of the extensor mechanism. A fresh blade is used to make a medial parapatellar arthrotomy. Soft tissue over the proximal medial tibia is subperiosteally elevated to the joint line with a knife and into the semimembranosus bursa with a  Cobb elevator. Soft tissue over the proximal lateral tibia is elevated with attention being paid to avoiding the patellar tendon on the tibial tubercle. The patella is everted, knee flexed 90 degrees and the ACL and PCL are removed. Findings are bone on bone lateral and patellofemoral with large global osteophytes.        The drill is used to create a starting hole in the distal femur and the canal is thoroughly irrigated with sterile saline to remove the fatty contents. The 5 degree Right  valgus alignment guide is placed into the femoral canal and the distal femoral cutting block is pinned to remove 9 mm off the distal femur. Resection is made with an oscillating saw.      The tibia is subluxed forward and the menisci are removed. The extramedullary alignment guide is placed referencing proximally at the medial aspect of the tibial tubercle and distally along the second metatarsal axis and tibial crest. The block is pinned to remove 33mm off the more deficient lateral  side. Resection is made with an oscillating saw. Size 2.5is the most appropriate size for the tibia and the proximal tibia is prepared with the modular drill and keel punch for that size.      The femoral sizing guide is placed and size 3 is most appropriate. Rotation is marked off the epicondylar axis and confirmed by creating a rectangular flexion gap at 90 degrees. The size 3 cutting block is pinned in this rotation and the anterior, posterior and chamfer cuts are made with the oscillating saw. The intercondylar block is then placed and that cut is made.  Trial size 2.5 tibial component, trial size 3 posterior stabilized femur and a 10  mm posterior stabilized rotating platform insert trial is placed. Full extension is achieved with excellent varus/valgus and anterior/posterior balance throughout full range of motion. The patella is everted and thickness measured to be 22  mm. Free hand resection is taken to 12 mm, a 35 template is placed,  lug holes are drilled, trial patella is placed, and it tracks normally. Osteophytes are removed off the posterior femur with the trial in place. All trials are removed and the cut bone surfaces prepared with pulsatile lavage. Cement is mixed and once ready for implantation, the size 2.5 tibial implant, size  3 posterior stabilized femoral component, and the size 35 patella are cemented in place and the patella is held with the clamp. The trial insert is placed and the knee held in full extension. The Exparel (20 ml mixed with 60 ml saline) is injected into the extensor mechanism, posterior capsule, medial and lateral gutters and subcutaneous tissues.  All extruded cement is removed and once the cement is hard the permanent 10 mm posterior stabilized rotating platform insert is placed into the tibial tray.      The wound is copiously irrigated with saline solution and the extensor mechanism closed with # 0 Stratofix suture. The tourniquet is released for a total tourniquet time of 30  minutes. Flexion against gravity is 140 degrees and the patella tracks normally. Subcutaneous tissue is closed with 2.0 vicryl and subcuticular with running 4.0 Monocryl. The incision is cleaned and dried and steri-strips and a bulky sterile dressing are applied. The limb is placed into a knee immobilizer and the patient is awakened and transported to recovery in stable condition.      Please note that a surgical assistant was a medical necessity for this procedure in order to perform it in a safe and expeditious manner. Surgical assistant was necessary to retract the ligaments and vital neurovascular structures to prevent injury to them and also necessary for proper positioning of the limb to allow for anatomic placement of the prosthesis.   Dione Plover Martha Burchfield, MD    01/02/2020, 11:28 AM

## 2020-01-02 NOTE — Anesthesia Procedure Notes (Signed)
Spinal  Patient location during procedure: OR Start time: 01/02/2020 10:34 AM End time: 01/02/2020 10:37 AM Staffing Performed: anesthesiologist  Anesthesiologist: Lyn Hollingshead, MD Preanesthetic Checklist Completed: patient identified, IV checked, site marked, risks and benefits discussed, surgical consent, monitors and equipment checked, pre-op evaluation and timeout performed Spinal Block Patient position: sitting Prep: DuraPrep and site prepped and draped Patient monitoring: continuous pulse ox and blood pressure Approach: midline Location: L3-4 Injection technique: single-shot Needle Needle type: Pencan  Needle gauge: 24 G Needle length: 10 cm Needle insertion depth: 6 cm Assessment Sensory level: T10

## 2020-01-03 ENCOUNTER — Encounter (HOSPITAL_COMMUNITY): Payer: Self-pay | Admitting: Orthopedic Surgery

## 2020-01-03 DIAGNOSIS — Z87891 Personal history of nicotine dependence: Secondary | ICD-10-CM | POA: Diagnosis not present

## 2020-01-03 DIAGNOSIS — Z96643 Presence of artificial hip joint, bilateral: Secondary | ICD-10-CM | POA: Diagnosis not present

## 2020-01-03 DIAGNOSIS — M1711 Unilateral primary osteoarthritis, right knee: Secondary | ICD-10-CM | POA: Diagnosis not present

## 2020-01-03 DIAGNOSIS — I10 Essential (primary) hypertension: Secondary | ICD-10-CM | POA: Diagnosis not present

## 2020-01-03 LAB — BASIC METABOLIC PANEL
Anion gap: 8 (ref 5–15)
BUN: 24 mg/dL — ABNORMAL HIGH (ref 8–23)
CO2: 25 mmol/L (ref 22–32)
Calcium: 9.1 mg/dL (ref 8.9–10.3)
Chloride: 105 mmol/L (ref 98–111)
Creatinine, Ser: 0.76 mg/dL (ref 0.44–1.00)
GFR, Estimated: >60 mL/min (ref >60)
Glucose, Bld: 119 mg/dL — ABNORMAL HIGH (ref 70–99)
Potassium: 3.4 mmol/L — ABNORMAL LOW (ref 3.5–5.1)
Sodium: 138 mmol/L (ref 135–145)

## 2020-01-03 LAB — CBC
HCT: 37.9 % (ref 36.0–46.0)
Hemoglobin: 12.2 g/dL (ref 12.0–15.0)
MCH: 30 pg (ref 26.0–34.0)
MCHC: 32.2 g/dL (ref 30.0–36.0)
MCV: 93.3 fL (ref 80.0–100.0)
Platelets: 244 10*3/uL (ref 150–400)
RBC: 4.06 MIL/uL (ref 3.87–5.11)
RDW: 13.8 % (ref 11.5–15.5)
WBC: 13.2 10*3/uL — ABNORMAL HIGH (ref 4.0–10.5)
nRBC: 0 % (ref 0.0–0.2)

## 2020-01-03 MED ORDER — POTASSIUM CHLORIDE CRYS ER 20 MEQ PO TBCR
40.0000 meq | EXTENDED_RELEASE_TABLET | ORAL | Status: AC
  Administered 2020-01-03 (×2): 40 meq via ORAL
  Filled 2020-01-03 (×2): qty 2

## 2020-01-03 MED ORDER — HYDROCHLOROTHIAZIDE 12.5 MG PO CAPS
12.5000 mg | ORAL_CAPSULE | Freq: Every day | ORAL | Status: DC
  Administered 2020-01-03: 12.5 mg via ORAL
  Filled 2020-01-03: qty 1

## 2020-01-03 MED ORDER — OXYCODONE HCL 5 MG PO TABS
5.0000 mg | ORAL_TABLET | Freq: Four times a day (QID) | ORAL | 0 refills | Status: DC | PRN
Start: 2020-01-03 — End: 2020-04-25

## 2020-01-03 MED ORDER — METHOCARBAMOL 500 MG PO TABS: 500.0000 mg | ORAL_TABLET | Freq: Four times a day (QID) | ORAL | 0 refills | Status: DC | PRN

## 2020-01-03 MED ORDER — RIVAROXABAN 10 MG PO TABS: 10.0000 mg | ORAL_TABLET | Freq: Every day | ORAL | 0 refills | Status: DC

## 2020-01-03 NOTE — Progress Notes (Signed)
Physical Therapy Treatment Patient Details Name: Martha Richard MRN: [id number] DOB: 08-16-1929 Today's Date: 01/03/2020    History of Present Illness pt 86 yo s/p R TKA 01/02/2020 with h/o L THA 05/2016 and R THA 04/2018, and HTN.    PT Comments    Progressing with mobility. Pt tolerated activity well. Minimal pain with activity. Will plan to have a 2nd session prior to possible d/c home today if medically cleared (elevated BP per chart).    Follow Up Recommendations  Follow surgeon's recommendation for DC plan and follow-up therapies     Equipment Recommendations  None recommended by PT    Recommendations for Other Services       Precautions / Restrictions Precautions Precautions: Knee Knee Immobilizer - Right: Discontinue once straight leg raise with < 10 degree lag Restrictions Weight Bearing Restrictions: No RLE Weight Bearing: Weight bearing as tolerated    Mobility  Bed Mobility Overal bed mobility: Needs Assistance Bed Mobility: Supine to Sit;Sit to Supine     Supine to sit: Min guard;HOB elevated Sit to supine: Min guard;HOB elevated      Transfers Overall transfer level: Needs assistance Equipment used: Rolling walker (2 wheeled) Transfers: Sit to/from Stand Sit to Stand: Min guard         General transfer comment: Min guard for safety.  Ambulation/Gait Ambulation/Gait assistance: Min guard Gait Distance (Feet): 40 Feet Assistive device: Rolling walker (2 wheeled) Gait Pattern/deviations: Step-to pattern     General Gait Details: MIn guard for safety. VCs safety, sequence. No buckling occurred during walk this session (without KI). Pt tolerated distance well. She denied lightheadedness/dizziness.   Stairs             Wheelchair Mobility    Modified Rankin (Stroke Patients Only)       Balance Overall balance assessment: Needs assistance         Standing balance support: Bilateral upper extremity supported Standing  balance-Leahy Scale: Fair                              Cognition Arousal/Alertness: Awake/alert Behavior During Therapy: WFL for tasks assessed/performed Overall Cognitive Status: Within Functional Limits for tasks assessed                                        Exercises Total Joint Exercises Ankle Circles/Pumps: AROM;Both;10 reps Quad Sets: AROM;Right;10 reps Heel Slides: AAROM;Right;10 reps Straight Leg Raises: AROM;Right;10 reps    General Comments        Pertinent Vitals/Pain Pain Assessment: Faces Faces Pain Scale: Hurts little more Pain Location: R knee Pain Descriptors / Indicators: Discomfort;Sore Pain Intervention(s): Limited activity within patient's tolerance;Monitored during session    Home Living                      Prior Function            PT Goals (current goals can now be found in the care plan section) Progress towards PT goals: Progressing toward goals    Frequency    7X/week      PT Plan Current plan remains appropriate    Co-evaluation              AM-PAC PT "6 Clicks" Mobility   Outcome Measure  Help needed turning from your back to your side while  in a flat bed without using bedrails?: A Little Help needed moving from lying on your back to sitting on the side of a flat bed without using bedrails?: A Little Help needed moving to and from a bed to a chair (including a wheelchair)?: A Little Help needed standing up from a chair using your arms (e.g., wheelchair or bedside chair)?: A Little Help needed to walk in hospital room?: A Little Help needed climbing 3-5 steps with a railing? : A Little 6 Click Score: 18    End of Session Equipment Utilized During Treatment: Gait belt Activity Tolerance: Patient tolerated treatment well Patient left: in bed;with call bell/phone within reach;with bed alarm set   PT Visit Diagnosis: Other abnormalities of gait and mobility (R26.89)     Time:  0722-5750 PT Time Calculation (min) (ACUTE ONLY): 14 min  Charges:  $Gait Training: 8-22 mins                        Doreatha Massed, PT Acute Rehabilitation  Office: [phone] Pager: [phone]

## 2020-01-03 NOTE — Progress Notes (Addendum)
Physical Therapy Treatment Patient Details Name: Martha Richard MRN: [id number] DOB: 12/10/1929 Today's Date: 01/03/2020    History of Present Illness pt 86 yo s/p R TKA 01/02/2020 with h/o L THA 05/2016 and R THA 04/2018, and HTN.    PT Comments    Pt's BP now down to 111/46. Pt tolerated increased ambulation distance of 110' with RW. Stair training completed. She is ready to DC home from PT standpoint.  Follow Up Recommendations  Follow surgeon's recommendation for DC plan and follow-up therapies (pt stated HH first then to OPPT)     Equipment Recommendations  3 in 1   Recommendations for Other Services       Precautions / Restrictions Precautions Precautions: Knee Knee Immobilizer - Right: Discontinue once straight leg raise with < 10 degree lag Restrictions Weight Bearing Restrictions: No RLE Weight Bearing: Weight bearing as tolerated    Mobility  Bed Mobility Overal bed mobility: Needs Assistance Bed Mobility: Supine to Sit;Sit to Supine     Supine to sit: Min guard;HOB elevated Sit to supine: Min guard;HOB elevated   General bed mobility comments: up at edge of bed  Transfers Overall transfer level: Needs assistance Equipment used: Rolling walker (2 wheeled) Transfers: Sit to/from Stand Sit to Stand: Min guard         General transfer comment: Min guard for safety.  Ambulation/Gait Ambulation/Gait assistance: Min guard Gait Distance (Feet): 110 Feet Assistive device: Rolling walker (2 wheeled) Gait Pattern/deviations: Step-to pattern Gait velocity: decr   General Gait Details: VCs sequencing and to keep hands on RW until fully backed up to recliner, no loss of balance   Stairs Stairs: Yes Stairs assistance: Min guard Stair Management: No rails;Forwards;Step to pattern Number of Stairs: 1 General stair comments: VCs sequencing   Wheelchair Mobility    Modified Rankin (Stroke Patients Only)       Balance Overall balance assessment:  Needs assistance Sitting-balance support: Bilateral upper extremity supported;Feet supported Sitting balance-Leahy Scale: Good     Standing balance support: Bilateral upper extremity supported;During functional activity Standing balance-Leahy Scale: Fair                              Cognition Arousal/Alertness: Awake/alert Behavior During Therapy: WFL for tasks assessed/performed Overall Cognitive Status: Within Functional Limits for tasks assessed                                        Exercises Total Joint Exercises Ankle Circles/Pumps: AROM;Both;10 reps Quad Sets: AROM;Right;10 reps Heel Slides: AAROM;Right;10 reps Straight Leg Raises: AROM;Right;10 reps    General Comments        Pertinent Vitals/Pain Pain Assessment: Faces Pain Score: 8  Faces Pain Scale: Hurts little more Pain Location: R knee with walking Pain Descriptors / Indicators: Discomfort;Sore Pain Intervention(s): Limited activity within patient's tolerance;Monitored during session;Patient requesting pain meds-RN notified    Home Living                      Prior Function            PT Goals (current goals can now be found in the care plan section) Acute Rehab PT Goals Patient Stated Goal: i like to walk want to get back to it , and reading PT Goal Formulation: With patient Time For Goal Achievement: 01/16/20 Potential  to Achieve Goals: Good Progress towards PT goals: Progressing toward goals    Frequency    7X/week      PT Plan Current plan remains appropriate    Co-evaluation              AM-PAC PT "6 Clicks" Mobility   Outcome Measure  Help needed turning from your back to your side while in a flat bed without using bedrails?: A Little Help needed moving from lying on your back to sitting on the side of a flat bed without using bedrails?: A Little Help needed moving to and from a bed to a chair (including a wheelchair)?: A Little Help  needed standing up from a chair using your arms (e.g., wheelchair or bedside chair)?: A Little Help needed to walk in hospital room?: A Little Help needed climbing 3-5 steps with a railing? : A Little 6 Click Score: 18    End of Session Equipment Utilized During Treatment: Gait belt Activity Tolerance: Patient tolerated treatment well Patient left: in chair;with chair alarm set;with call bell/phone within reach Nurse Communication: Mobility status PT Visit Diagnosis: Other abnormalities of gait and mobility (R26.89)     Time: 3716-9678 PT Time Calculation (min) (ACUTE ONLY): 14 min  Charges:  $Gait Training: 8-22 mins                     Blondell Reveal Kistler PT 01/03/2020  Acute Rehabilitation Services Pager [phone] Office [phone]

## 2020-01-03 NOTE — Progress Notes (Signed)
Made Dr. Wynelle Link aware of patient's high blood pressure.

## 2020-01-03 NOTE — Progress Notes (Signed)
PT Cancellation Note  Patient Details Name: RAYVEN RETTIG MRN: [id number] DOB: September 14, 1929   Cancelled Treatment:    Reason Eval/Treat Not Completed: Medical issues which prohibited therapy (BP 200/81. RN aware. Will check back after BP medication has time to take effect.)  Philomena Doheny PT 01/03/2020  Acute Rehabilitation Services Pager [phone] Office [phone]

## 2020-01-03 NOTE — Plan of Care (Signed)
  Problem: Education: Goal: Knowledge of General Education information will improve Description Including pain rating scale, medication(s)/side effects and non-pharmacologic comfort measures Outcome: Progressing   

## 2020-01-03 NOTE — Progress Notes (Signed)
Subjective: 1 Day Post-Op Procedure(s) (LRB): TOTAL KNEE ARTHROPLASTY (Right) Patient reports pain as mild.   Patient seen in rounds by Dr. Wynelle Link. Patient is well, and has had no acute complaints or problems other than pain in the right knee. Denies chest pain, SOB, or calf pain. Had issues with hypertension last night, home medication HCTZ was held day of surgery. Hydralazine 10 mg Q6 IV ordered. Will resume HCTZ today and continue to monitor.  We will continue therapy today.   Objective: Vital signs in last 24 hours: Temp:  [97.5 F (36.4 C)-98.4 F (36.9 C)] 98.2 F (36.8 C) (11/09 0622) Pulse Rate:  [51-103] 80 (11/09 0622) Resp:  [9-33] 16 (11/09 0622) BP: (116-207)/(52-89) 183/71 (11/09 0622) SpO2:  [97 %-100 %] 99 % (11/09 0622) Weight:  [64.9 kg] 64.9 kg (11/08 0854)  Intake/Output from previous day:  Intake/Output Summary (Last 24 hours) at 01/03/2020 0817 Last data filed at 01/03/2020 0605 Gross per 24 hour  Intake 2165 ml  Output 1425 ml  Net 740 ml     Intake/Output this shift: No intake/output data recorded.  Labs: Recent Labs    01/03/20 0337  HGB 12.2   Recent Labs    01/03/20 0337  WBC 13.2*  RBC 4.06  HCT 37.9  PLT 244   Recent Labs    01/03/20 0337  NA 138  K 3.4*  CL 105  CO2 25  BUN 24*  CREATININE 0.76  GLUCOSE 119*  CALCIUM 9.1   No results for input(s): LABPT, INR in the last 72 hours.  Exam: General - Patient is Alert and Oriented Extremity - Neurologically intact Neurovascular intact Sensation intact distally Dorsiflexion/Plantar flexion intact Dressing - dressing C/D/I Motor Function - intact, moving foot and toes well on exam.   Past Medical History:  Diagnosis Date  . Arrhythmia    palpitations  . Arthritis   . Cancer (HCC)    squamous cell Left hand  . Complication of anesthesia    slow to wake up  . Depression    Lost a son to cancer  . Headache    hx of migraines  . Hypertension   . Hypertensive  retinopathy    OU  . Left bundle branch block     Assessment/Plan: 1 Day Post-Op Procedure(s) (LRB): TOTAL KNEE ARTHROPLASTY (Right) Principal Problem:   OA (osteoarthritis) of knee Active Problems:   Primary osteoarthritis of right knee  Estimated body mass index is 26.15 kg/m as calculated from the following:   Height as of this encounter: 5\' 2"  (1.575 m).   Weight as of this encounter: 64.9 kg. Advance diet Up with therapy D/C IV fluids   Patient's anticipated LOS is less than 2 midnights, meeting these requirements: - Lives within 1 hour of care - Has a competent adult at home to recover with post-op recover - NO history of  - Chronic pain requiring opiods  - Diabetes  - Coronary Artery Disease  - Heart failure  - Heart attack  - Stroke  - DVT/VTE  - Cardiac arrhythmia  - Respiratory Failure/COPD  - Renal failure  - Anemia  - Advanced Liver disease  DVT Prophylaxis - Xarelto Weight bearing as tolerated. Continue therapy.  Potassium 3.4 this AM, 2 doses of 40 mEq KCl ordered.  Plan is to go Home after hospital stay. Possible discharge later today if blood pressure controlled and meeting goals with therapy. Scheduled for outpatient physical therapy at [facility] in the office November 23rd  The PDMP database was reviewed today prior to any opioid medications being prescribed to this patient.   Theresa Duty, PA-C Orthopedic Surgery [phone] 01/03/2020, 8:17 AM

## 2020-01-06 DIAGNOSIS — R262 Difficulty in walking, not elsewhere classified: Secondary | ICD-10-CM | POA: Diagnosis not present

## 2020-01-06 DIAGNOSIS — M1711 Unilateral primary osteoarthritis, right knee: Secondary | ICD-10-CM | POA: Diagnosis not present

## 2020-01-06 DIAGNOSIS — M6281 Muscle weakness (generalized): Secondary | ICD-10-CM | POA: Diagnosis not present

## 2020-01-06 DIAGNOSIS — Z471 Aftercare following joint replacement surgery: Secondary | ICD-10-CM | POA: Diagnosis not present

## 2020-01-06 LAB — TYPE AND SCREEN
ABO/RH(D): O POS
Antibody Screen: NEGATIVE
Unit division: 0
Unit division: 0

## 2020-01-06 LAB — BPAM RBC
Blood Product Expiration Date: 202112032359
Blood Product Expiration Date: 202112032359
Unit Type and Rh: 5100
Unit Type and Rh: 5100

## 2020-01-09 DIAGNOSIS — M1711 Unilateral primary osteoarthritis, right knee: Secondary | ICD-10-CM | POA: Diagnosis not present

## 2020-01-09 DIAGNOSIS — R262 Difficulty in walking, not elsewhere classified: Secondary | ICD-10-CM | POA: Diagnosis not present

## 2020-01-09 DIAGNOSIS — M6281 Muscle weakness (generalized): Secondary | ICD-10-CM | POA: Diagnosis not present

## 2020-01-09 DIAGNOSIS — Z471 Aftercare following joint replacement surgery: Secondary | ICD-10-CM | POA: Diagnosis not present

## 2020-01-11 DIAGNOSIS — Z471 Aftercare following joint replacement surgery: Secondary | ICD-10-CM | POA: Diagnosis not present

## 2020-01-11 DIAGNOSIS — R262 Difficulty in walking, not elsewhere classified: Secondary | ICD-10-CM | POA: Diagnosis not present

## 2020-01-11 DIAGNOSIS — M1711 Unilateral primary osteoarthritis, right knee: Secondary | ICD-10-CM | POA: Diagnosis not present

## 2020-01-11 DIAGNOSIS — M6281 Muscle weakness (generalized): Secondary | ICD-10-CM | POA: Diagnosis not present

## 2020-01-13 DIAGNOSIS — R262 Difficulty in walking, not elsewhere classified: Secondary | ICD-10-CM | POA: Diagnosis not present

## 2020-01-13 DIAGNOSIS — M6281 Muscle weakness (generalized): Secondary | ICD-10-CM | POA: Diagnosis not present

## 2020-01-13 DIAGNOSIS — Z471 Aftercare following joint replacement surgery: Secondary | ICD-10-CM | POA: Diagnosis not present

## 2020-01-13 DIAGNOSIS — M1711 Unilateral primary osteoarthritis, right knee: Secondary | ICD-10-CM | POA: Diagnosis not present

## 2020-01-16 DIAGNOSIS — M1711 Unilateral primary osteoarthritis, right knee: Secondary | ICD-10-CM | POA: Diagnosis not present

## 2020-01-16 DIAGNOSIS — R262 Difficulty in walking, not elsewhere classified: Secondary | ICD-10-CM | POA: Diagnosis not present

## 2020-01-16 DIAGNOSIS — M6281 Muscle weakness (generalized): Secondary | ICD-10-CM | POA: Diagnosis not present

## 2020-01-16 DIAGNOSIS — Z471 Aftercare following joint replacement surgery: Secondary | ICD-10-CM | POA: Diagnosis not present

## 2020-01-17 DIAGNOSIS — M6281 Muscle weakness (generalized): Secondary | ICD-10-CM | POA: Diagnosis not present

## 2020-01-17 DIAGNOSIS — Z471 Aftercare following joint replacement surgery: Secondary | ICD-10-CM | POA: Diagnosis not present

## 2020-01-17 DIAGNOSIS — R262 Difficulty in walking, not elsewhere classified: Secondary | ICD-10-CM | POA: Diagnosis not present

## 2020-01-17 DIAGNOSIS — M1711 Unilateral primary osteoarthritis, right knee: Secondary | ICD-10-CM | POA: Diagnosis not present

## 2020-01-20 DIAGNOSIS — Z471 Aftercare following joint replacement surgery: Secondary | ICD-10-CM | POA: Diagnosis not present

## 2020-01-20 DIAGNOSIS — R262 Difficulty in walking, not elsewhere classified: Secondary | ICD-10-CM | POA: Diagnosis not present

## 2020-01-20 DIAGNOSIS — M6281 Muscle weakness (generalized): Secondary | ICD-10-CM | POA: Diagnosis not present

## 2020-01-20 DIAGNOSIS — M1711 Unilateral primary osteoarthritis, right knee: Secondary | ICD-10-CM | POA: Diagnosis not present

## 2020-01-24 DIAGNOSIS — M6281 Muscle weakness (generalized): Secondary | ICD-10-CM | POA: Diagnosis not present

## 2020-01-24 DIAGNOSIS — Z471 Aftercare following joint replacement surgery: Secondary | ICD-10-CM | POA: Diagnosis not present

## 2020-01-24 DIAGNOSIS — R262 Difficulty in walking, not elsewhere classified: Secondary | ICD-10-CM | POA: Diagnosis not present

## 2020-01-24 DIAGNOSIS — M1711 Unilateral primary osteoarthritis, right knee: Secondary | ICD-10-CM | POA: Diagnosis not present

## 2020-01-31 DIAGNOSIS — R262 Difficulty in walking, not elsewhere classified: Secondary | ICD-10-CM | POA: Diagnosis not present

## 2020-01-31 DIAGNOSIS — M1711 Unilateral primary osteoarthritis, right knee: Secondary | ICD-10-CM | POA: Diagnosis not present

## 2020-01-31 DIAGNOSIS — Z471 Aftercare following joint replacement surgery: Secondary | ICD-10-CM | POA: Diagnosis not present

## 2020-01-31 DIAGNOSIS — M6281 Muscle weakness (generalized): Secondary | ICD-10-CM | POA: Diagnosis not present

## 2020-02-02 DIAGNOSIS — M6281 Muscle weakness (generalized): Secondary | ICD-10-CM | POA: Diagnosis not present

## 2020-02-02 DIAGNOSIS — R262 Difficulty in walking, not elsewhere classified: Secondary | ICD-10-CM | POA: Diagnosis not present

## 2020-02-02 DIAGNOSIS — Z471 Aftercare following joint replacement surgery: Secondary | ICD-10-CM | POA: Diagnosis not present

## 2020-02-02 DIAGNOSIS — M1711 Unilateral primary osteoarthritis, right knee: Secondary | ICD-10-CM | POA: Diagnosis not present

## 2020-02-06 DIAGNOSIS — M1711 Unilateral primary osteoarthritis, right knee: Secondary | ICD-10-CM | POA: Diagnosis not present

## 2020-02-06 DIAGNOSIS — R262 Difficulty in walking, not elsewhere classified: Secondary | ICD-10-CM | POA: Diagnosis not present

## 2020-02-06 DIAGNOSIS — M6281 Muscle weakness (generalized): Secondary | ICD-10-CM | POA: Diagnosis not present

## 2020-02-06 DIAGNOSIS — Z471 Aftercare following joint replacement surgery: Secondary | ICD-10-CM | POA: Diagnosis not present

## 2020-02-14 DIAGNOSIS — Z96651 Presence of right artificial knee joint: Secondary | ICD-10-CM | POA: Diagnosis not present

## 2020-04-20 DIAGNOSIS — Z885 Allergy status to narcotic agent status: Secondary | ICD-10-CM | POA: Diagnosis not present

## 2020-04-20 DIAGNOSIS — Z20822 Contact with and (suspected) exposure to covid-19: Secondary | ICD-10-CM | POA: Diagnosis not present

## 2020-04-20 DIAGNOSIS — K625 Hemorrhage of anus and rectum: Secondary | ICD-10-CM | POA: Diagnosis not present

## 2020-04-20 DIAGNOSIS — N281 Cyst of kidney, acquired: Secondary | ICD-10-CM | POA: Diagnosis not present

## 2020-04-20 DIAGNOSIS — M47816 Spondylosis without myelopathy or radiculopathy, lumbar region: Secondary | ICD-10-CM | POA: Diagnosis not present

## 2020-04-20 DIAGNOSIS — I10 Essential (primary) hypertension: Secondary | ICD-10-CM | POA: Diagnosis not present

## 2020-04-20 DIAGNOSIS — Z880 Allergy status to penicillin: Secondary | ICD-10-CM | POA: Diagnosis not present

## 2020-04-20 DIAGNOSIS — Z79899 Other long term (current) drug therapy: Secondary | ICD-10-CM | POA: Diagnosis not present

## 2020-04-23 DIAGNOSIS — Z885 Allergy status to narcotic agent status: Secondary | ICD-10-CM | POA: Diagnosis not present

## 2020-04-23 DIAGNOSIS — Z79899 Other long term (current) drug therapy: Secondary | ICD-10-CM | POA: Diagnosis not present

## 2020-04-23 DIAGNOSIS — Z880 Allergy status to penicillin: Secondary | ICD-10-CM | POA: Diagnosis not present

## 2020-04-23 DIAGNOSIS — K625 Hemorrhage of anus and rectum: Secondary | ICD-10-CM | POA: Diagnosis not present

## 2020-04-24 ENCOUNTER — Encounter: Payer: Self-pay | Admitting: Gastroenterology

## 2020-04-24 DIAGNOSIS — Z1389 Encounter for screening for other disorder: Secondary | ICD-10-CM | POA: Diagnosis not present

## 2020-04-24 DIAGNOSIS — D649 Anemia, unspecified: Secondary | ICD-10-CM | POA: Diagnosis not present

## 2020-04-24 DIAGNOSIS — R011 Cardiac murmur, unspecified: Secondary | ICD-10-CM | POA: Diagnosis not present

## 2020-04-24 DIAGNOSIS — K625 Hemorrhage of anus and rectum: Secondary | ICD-10-CM | POA: Diagnosis not present

## 2020-04-24 DIAGNOSIS — I10 Essential (primary) hypertension: Secondary | ICD-10-CM | POA: Diagnosis not present

## 2020-04-24 NOTE — Progress Notes (Signed)
Referring Provider: Manon Hilding, MD Primary Care Physician:  Manon Hilding, MD Primary Gastroenterologist:  Dr. Abbey Chatters  Chief Complaint  Patient presents with  . Rectal Bleeding    Hard blood in stool today, been to ER recently    HPI:   Martha Richard is a 87 y.o. female presenting today at the request of Sasser, Silvestre Moment, MD for rectal bleeding.  Patient has presented twice to the emergency room at [facility] in the last week due to rectal bleeding.  Initial visit 04/20/2020 for new onset rectal bleeding.  Hemoglobin 11.9.  CTA abdomen/pelvis with no active GI bleeding or acute findings identified, 2.4 cm uterine mass, likely fibroid, recommended correlating clinically for vaginal bleeding and consider pelvic ultrasound, small left adrenal adenoma, cardiomegaly with small pericardial effusion.  She was discharged and advised to follow-up with PCP.  She presented again 2/28 reporting 1 episode of BRBPR, no abdominal pain.  Rectal exam with heme positive stool.  Hemoglobin 10.5.  Discussed transferring patient to facility for admission with GI evaluation.  Patient did not want to be admitted and preferred follow-up with PCP, so she was discharged.   Hemoglobin 12.03 October 2019  Today: Rectal bleeding started Friday morning. Woke up and felt something coming out of her and realized she was bleeding from her rectum. She cleaned up and went back to bed. Occurred 2 more times Friday. Then Sunday night, she had another episode but was less severe. Occurred 3 times. Some bright red blood, dark red blood, clots, and stringy blood. Wasn't having BMs with passing the blood. Yesterday, she had a little blood on toilet tissue. Today, she had a BM that was bloody. Water did not turn red. Stool was soft. No straining. Taking MiraLAX as needed, maybe every couple of days to keep her stools soft. Bowels move daily. Has a lot of gas. This started recently with rectal bleeding.   Later states she  thinks bleeding may have started a couple weeks ago when she was making tomato soup. Thought the soup was turning her stool a little red. No abdominal pain. No nausea or vomiting. No similar symptoms in the past.   History of pre-cancerous colon polyps. Last colonoscopy in [place] somewhere > 10 years ago.   No NSAID products.  No blood thinners.  PCP just started her on oral iron.   No lightheadedness or feeling like she will pass out. If her BP drops she feels weak. This has been intermittent for a long time. BP medications has been stopped. Lost weight after her husband got sick and she had to do everything. Recently, weight has been stable.   No GERD, nausea, vomiting, or dysphagia.   Past Medical History:  Diagnosis Date  . Aortic stenosis   . Arrhythmia    palpitations  . Arthritis   . Cancer (HCC)    squamous cell Left hand  . Complication of anesthesia    slow to wake up  . Depression    Lost a son to cancer  . Headache    hx of migraines  . Hypertension   . Hypertensive retinopathy    OU  . Left bundle branch block     Past Surgical History:  Procedure Laterality Date  . APPENDECTOMY    . CATARACT EXTRACTION Bilateral    OU about 10 yrs ago  . EYE SURGERY    . fibroadenoma left breast removal    . OTHER SURGICAL HISTORY  appendectomy  . TOTAL HIP ARTHROPLASTY Left 06/18/2016   Procedure: LEFT TOTAL HIP ARTHROPLASTY ANTERIOR APPROACH;  Surgeon: Gaynelle Arabian, MD;  Location: [facility];  Service: Orthopedics;  Laterality: Left;  . TOTAL HIP ARTHROPLASTY Right 05/05/2018   Procedure: TOTAL HIP ARTHROPLASTY ANTERIOR APPROACH;  Surgeon: Gaynelle Arabian, MD;  Location: [facility];  Service: Orthopedics;  Laterality: Right;  . TOTAL KNEE ARTHROPLASTY Right 01/02/2020   Procedure: TOTAL KNEE ARTHROPLASTY;  Surgeon: Gaynelle Arabian, MD;  Location: [facility];  Service: Orthopedics;  Laterality: Right;  80mn  . UTERINE FIBROID SURGERY      Current Outpatient Medications   Medication Sig Dispense Refill  . ferrous sulfate 325 (65 FE) MG tablet Take 325 mg by mouth daily with breakfast.    . CLENPIQ 10-3.5-12 MG-GM -GM/160ML SOLN Take 1 kit by mouth once for 1 dose. 320 mL 0  . hydrochlorothiazide (HYDRODIURIL) 12.5 MG tablet Take 12.5 mg by mouth daily as needed (salt intake).  (Patient not taking: Reported on 04/25/2020)     Current Facility-Administered Medications  Medication Dose Route Frequency Provider Last Rate Last Admin  . Bevacizumab (AVASTIN) SOLN 1.25 mg  1.25 mg Intravitreal  ZBernarda Caffey MD   1.25 mg at 04/20/17 1138  . Bevacizumab (AVASTIN) SOLN 1.25 mg  1.25 mg Intravitreal  ZBernarda Caffey MD   1.25 mg at 04/20/17 1455  . Bevacizumab (AVASTIN) SOLN 1.25 mg  1.25 mg Intravitreal  ZBernarda Caffey MD   1.25 mg at 05/25/17 1345  . Bevacizumab (AVASTIN) SOLN 1.25 mg  1.25 mg Intravitreal  ZBernarda Caffey MD   1.25 mg at 06/30/17 1351  . Bevacizumab (AVASTIN) SOLN 1.25 mg  1.25 mg Intravitreal  ZBernarda Caffey MD   1.25 mg at 08/23/17 0034  . Bevacizumab (AVASTIN) SOLN 1.25 mg  1.25 mg Intravitreal  ZBernarda Caffey MD   1.25 mg at 04/16/18 1349    Allergies as of 04/25/2020 - Review Complete 04/25/2020  Allergen Reaction Noted  . Ace inhibitors Cough 05/09/2016  . Caffeine Other (See Comments)   . Nsaids Other (See Comments) 05/09/2016  . Peanut-containing drug products Other (See Comments) 05/09/2016  . Ultram [tramadol hcl] Other (See Comments) 05/09/2016  . Penicillins Itching and Rash 05/09/2016    Family History  Problem Relation Age of Onset  . Heart disease Mother   . Heart attack Mother   . Heart failure Mother   . Stroke Father   . Heart attack Maternal Aunt   . Heart disease Maternal Aunt   . Heart failure Maternal Aunt   . Amblyopia Neg Hx   . Blindness Neg Hx   . Cataracts Neg Hx   . Glaucoma Neg Hx   . Macular degeneration Neg Hx   . Retinal detachment Neg Hx   . Strabismus Neg Hx   . Retinitis pigmentosa Neg Hx   . Colon  cancer Neg Hx     Social History   Socioeconomic History  . Marital status: Married    Spouse name: Not on file  . Number of children: Not on file  . Years of education: Not on file  . Highest education level: Not on file  Occupational History  . Not on file  Tobacco Use  . Smoking status: Former SResearch scientist (life sciences) . Smokeless tobacco: Never Used  . Tobacco comment: years ago 50 plus years  Vaping Use  . Vaping Use: Never used  Substance and Sexual Activity  . Alcohol use: No  . Drug use: No  .  Sexual activity: Not Currently    Birth control/protection: Post-menopausal  Other Topics Concern  . Not on file  Social History Narrative  . Not on file   Social Determinants of Health   Financial Resource Strain: Not on file  Food Insecurity: Not on file  Transportation Needs: Not on file  Physical Activity: Not on file  Stress: Not on file  Social Connections: Not on file  Intimate Partner Violence: Not on file    Review of Systems: Gen: Denies any fever, chills, cold or flu like symptoms.  CV: Denies chest pain or heart palpitations.  Reports heart murmur. Resp: Denies shortness of breath or cough.  GI: See HPI Heme: See HPI  Physical Exam: BP (!) 131/59   Pulse 62   Temp (!) 97.3 F (36.3 C)   Ht 5' 2.5" (1.588 m)   Wt 142 lb 12.8 oz (64.8 kg)   BMI 25.70 kg/m  General:   Alert and oriented. Pleasant and cooperative. Well-nourished and well-developed.  Walks with a cane. Head:  Normocephalic and atraumatic. Eyes:  Without icterus, sclera clear and conjunctiva pink.  Ears: Decreased auditory acuity. Lungs:  Clear to auscultation bilaterally. No wheezes, rales, or rhonchi. No distress.  Heart:  S1, S2 present.  Harsh systolic murmur best appreciated at right second intercostal space. Abdomen:  +BS, soft, non-tender and non-distended. No HSM noted. No guarding or rebound. No masses appreciated.  Rectal:  No external lesions. No appreciable internal hemorrhoids or rectal  mass. Soft stool in rectal vault felt with tip of gloved exam finger, soft brown stool noted. Hemoccult was positive.  Msk:  Symmetrical without gross deformities. Normal posture. Extremities:  With trace LE edema. Neurologic:  Alert and  oriented x4;  grossly normal neurologically. Skin:  Intact without significant lesions or rashes. Psych:  Normal mood and affect.  Assessment:  87 year old female with history of left bundle branch block, aortic stenosis, HTN presenting today due to new onset painless rectal bleeding. Patient repots stools having a red tent x 2 weeks, then on 2/25 and again on 2/27, she had 3 episodes of passing only bright red and dark red blood per rectum with associated blood clots without passing stool. No melena. Since then, she had toilet tissue hematochezia yesterday, and had blood in her stool today. No constipation or diarrhea. Denies associated abdominal pain or pre-syncope prior to onset. No NSAID use or blood thinners.  No similar symptoms in the past.  No significant upper GI symptoms.  Weight has been fairly stable recently. Last colonoscopy 10+ years ago in [place].  Reports history of precancerous polyps.  She was evaluated in the emergency room at [facility] 2/25.  Hemoglobin 11.9, CTA abdomen pelvis with no acute GI bleeding or acute findings identified.  Presented again 2/28 due to recurrent bleeding.  Hemoglobin at that time 10.5. ED provider discussed admission with patient, but she declined. Rectal exam with no significant external or internal lesions, soft brown stool on gloved exam finger, hemoccult positive.   Differentials include diverticular bleed, colon polyps, or malignancy.  Not consistent with ischemic colitis.  Plan: 1.  Update H/H. 2.  Proceed with colonoscopy with propofol with Dr. Abbey Chatters ASAP. The risks, benefits, and alternatives have been discussed with the patient in detail. The patient states understanding and desires to proceed.  ASA  III Hold iron x7 days prior to procedure. 3.  Advised that she has any recurrent large-volume rectal bleeding, feels lightheaded, dizzy, or like she would pass  out, she should proceed to the emergency room.

## 2020-04-24 NOTE — H&P (View-Only) (Signed)
Referring Provider: Manon Hilding, MD Primary Care Physician:  Manon Hilding, MD Primary Gastroenterologist:  Dr. Abbey Chatters  Chief Complaint  Patient presents with  . Rectal Bleeding    Hard blood in stool today, been to ER recently    HPI:   Martha Richard is a 87 y.o. female presenting today at the request of Sasser, Silvestre Moment, MD for rectal bleeding.  Patient has presented twice to the emergency room at [facility] in the last week due to rectal bleeding.  Initial visit 04/20/2020 for new onset rectal bleeding.  Hemoglobin 11.9.  CTA abdomen/pelvis with no active GI bleeding or acute findings identified, 2.4 cm uterine mass, likely fibroid, recommended correlating clinically for vaginal bleeding and consider pelvic ultrasound, small left adrenal adenoma, cardiomegaly with small pericardial effusion.  She was discharged and advised to follow-up with PCP.  She presented again 2/28 reporting 1 episode of BRBPR, no abdominal pain.  Rectal exam with heme positive stool.  Hemoglobin 10.5.  Discussed transferring patient to facility for admission with GI evaluation.  Patient did not want to be admitted and preferred follow-up with PCP, so she was discharged.   Hemoglobin 12.03 October 2019  Today: Rectal bleeding started Friday morning. Woke up and felt something coming out of her and realized she was bleeding from her rectum. She cleaned up and went back to bed. Occurred 2 more times Friday. Then Sunday night, she had another episode but was less severe. Occurred 3 times. Some bright red blood, dark red blood, clots, and stringy blood. Wasn't having BMs with passing the blood. Yesterday, she had a little blood on toilet tissue. Today, she had a BM that was bloody. Water did not turn red. Stool was soft. No straining. Taking MiraLAX as needed, maybe every couple of days to keep her stools soft. Bowels move daily. Has a lot of gas. This started recently with rectal bleeding.   Later states she  thinks bleeding may have started a couple weeks ago when she was making tomato soup. Thought the soup was turning her stool a little red. No abdominal pain. No nausea or vomiting. No similar symptoms in the past.   History of pre-cancerous colon polyps. Last colonoscopy in [place] somewhere > 10 years ago.   No NSAID products.  No blood thinners.  PCP just started her on oral iron.   No lightheadedness or feeling like she will pass out. If her BP drops she feels weak. This has been intermittent for a long time. BP medications has been stopped. Lost weight after her husband got sick and she had to do everything. Recently, weight has been stable.   No GERD, nausea, vomiting, or dysphagia.   Past Medical History:  Diagnosis Date  . Aortic stenosis   . Arrhythmia    palpitations  . Arthritis   . Cancer (HCC)    squamous cell Left hand  . Complication of anesthesia    slow to wake up  . Depression    Lost a son to cancer  . Headache    hx of migraines  . Hypertension   . Hypertensive retinopathy    OU  . Left bundle branch block     Past Surgical History:  Procedure Laterality Date  . APPENDECTOMY    . CATARACT EXTRACTION Bilateral    OU about 10 yrs ago  . EYE SURGERY    . fibroadenoma left breast removal    . OTHER SURGICAL HISTORY  appendectomy  . TOTAL HIP ARTHROPLASTY Left 06/18/2016   Procedure: LEFT TOTAL HIP ARTHROPLASTY ANTERIOR APPROACH;  Surgeon: Gaynelle Arabian, MD;  Location: [facility];  Service: Orthopedics;  Laterality: Left;  . TOTAL HIP ARTHROPLASTY Right 05/05/2018   Procedure: TOTAL HIP ARTHROPLASTY ANTERIOR APPROACH;  Surgeon: Gaynelle Arabian, MD;  Location: [facility];  Service: Orthopedics;  Laterality: Right;  . TOTAL KNEE ARTHROPLASTY Right 01/02/2020   Procedure: TOTAL KNEE ARTHROPLASTY;  Surgeon: Gaynelle Arabian, MD;  Location: [facility];  Service: Orthopedics;  Laterality: Right;  53mn  . UTERINE FIBROID SURGERY      Current Outpatient Medications   Medication Sig Dispense Refill  . ferrous sulfate 325 (65 FE) MG tablet Take 325 mg by mouth daily with breakfast.    . CLENPIQ 10-3.5-12 MG-GM -GM/160ML SOLN Take 1 kit by mouth once for 1 dose. 320 mL 0  . hydrochlorothiazide (HYDRODIURIL) 12.5 MG tablet Take 12.5 mg by mouth daily as needed (salt intake).  (Patient not taking: Reported on 04/25/2020)     Current Facility-Administered Medications  Medication Dose Route Frequency Provider Last Rate Last Admin  . Bevacizumab (AVASTIN) SOLN 1.25 mg  1.25 mg Intravitreal  ZBernarda Caffey MD   1.25 mg at 04/20/17 1138  . Bevacizumab (AVASTIN) SOLN 1.25 mg  1.25 mg Intravitreal  ZBernarda Caffey MD   1.25 mg at 04/20/17 1455  . Bevacizumab (AVASTIN) SOLN 1.25 mg  1.25 mg Intravitreal  ZBernarda Caffey MD   1.25 mg at 05/25/17 1345  . Bevacizumab (AVASTIN) SOLN 1.25 mg  1.25 mg Intravitreal  ZBernarda Caffey MD   1.25 mg at 06/30/17 1351  . Bevacizumab (AVASTIN) SOLN 1.25 mg  1.25 mg Intravitreal  ZBernarda Caffey MD   1.25 mg at 08/23/17 0034  . Bevacizumab (AVASTIN) SOLN 1.25 mg  1.25 mg Intravitreal  ZBernarda Caffey MD   1.25 mg at 04/16/18 1349    Allergies as of 04/25/2020 - Review Complete 04/25/2020  Allergen Reaction Noted  . Ace inhibitors Cough 05/09/2016  . Caffeine Other (See Comments)   . Nsaids Other (See Comments) 05/09/2016  . Peanut-containing drug products Other (See Comments) 05/09/2016  . Ultram [tramadol hcl] Other (See Comments) 05/09/2016  . Penicillins Itching and Rash 05/09/2016    Family History  Problem Relation Age of Onset  . Heart disease Mother   . Heart attack Mother   . Heart failure Mother   . Stroke Father   . Heart attack Maternal Aunt   . Heart disease Maternal Aunt   . Heart failure Maternal Aunt   . Amblyopia Neg Hx   . Blindness Neg Hx   . Cataracts Neg Hx   . Glaucoma Neg Hx   . Macular degeneration Neg Hx   . Retinal detachment Neg Hx   . Strabismus Neg Hx   . Retinitis pigmentosa Neg Hx   . Colon  cancer Neg Hx     Social History   Socioeconomic History  . Marital status: Married    Spouse name: Not on file  . Number of children: Not on file  . Years of education: Not on file  . Highest education level: Not on file  Occupational History  . Not on file  Tobacco Use  . Smoking status: Former SResearch scientist (life sciences) . Smokeless tobacco: Never Used  . Tobacco comment: years ago 50 plus years  Vaping Use  . Vaping Use: Never used  Substance and Sexual Activity  . Alcohol use: No  . Drug use: No  .  Sexual activity: Not Currently    Birth control/protection: Post-menopausal  Other Topics Concern  . Not on file  Social History Narrative  . Not on file   Social Determinants of Health   Financial Resource Strain: Not on file  Food Insecurity: Not on file  Transportation Needs: Not on file  Physical Activity: Not on file  Stress: Not on file  Social Connections: Not on file  Intimate Partner Violence: Not on file    Review of Systems: Gen: Denies any fever, chills, cold or flu like symptoms.  CV: Denies chest pain or heart palpitations.  Reports heart murmur. Resp: Denies shortness of breath or cough.  GI: See HPI Heme: See HPI  Physical Exam: BP (!) 131/59   Pulse 62   Temp (!) 97.3 F (36.3 C)   Ht 5' 2.5" (1.588 m)   Wt 142 lb 12.8 oz (64.8 kg)   BMI 25.70 kg/m  General:   Alert and oriented. Pleasant and cooperative. Well-nourished and well-developed.  Walks with a cane. Head:  Normocephalic and atraumatic. Eyes:  Without icterus, sclera clear and conjunctiva pink.  Ears: Decreased auditory acuity. Lungs:  Clear to auscultation bilaterally. No wheezes, rales, or rhonchi. No distress.  Heart:  S1, S2 present.  Harsh systolic murmur best appreciated at right second intercostal space. Abdomen:  +BS, soft, non-tender and non-distended. No HSM noted. No guarding or rebound. No masses appreciated.  Rectal:  No external lesions. No appreciable internal hemorrhoids or rectal  mass. Soft stool in rectal vault felt with tip of gloved exam finger, soft brown stool noted. Hemoccult was positive.  Msk:  Symmetrical without gross deformities. Normal posture. Extremities:  With trace LE edema. Neurologic:  Alert and  oriented x4;  grossly normal neurologically. Skin:  Intact without significant lesions or rashes. Psych:  Normal mood and affect.  Assessment:  87 year old female with history of left bundle branch block, aortic stenosis, HTN presenting today due to new onset painless rectal bleeding. Patient repots stools having a red tent x 2 weeks, then on 2/25 and again on 2/27, she had 3 episodes of passing only bright red and dark red blood per rectum with associated blood clots without passing stool. No melena. Since then, she had toilet tissue hematochezia yesterday, and had blood in her stool today. No constipation or diarrhea. Denies associated abdominal pain or pre-syncope prior to onset. No NSAID use or blood thinners.  No similar symptoms in the past.  No significant upper GI symptoms.  Weight has been fairly stable recently. Last colonoscopy 10+ years ago in [place].  Reports history of precancerous polyps.  She was evaluated in the emergency room at [facility] 2/25.  Hemoglobin 11.9, CTA abdomen pelvis with no acute GI bleeding or acute findings identified.  Presented again 2/28 due to recurrent bleeding.  Hemoglobin at that time 10.5. ED provider discussed admission with patient, but she declined. Rectal exam with no significant external or internal lesions, soft brown stool on gloved exam finger, hemoccult positive.   Differentials include diverticular bleed, colon polyps, or malignancy.  Not consistent with ischemic colitis.  Plan: 1.  Update H/H. 2.  Proceed with colonoscopy with propofol with Dr. Abbey Chatters ASAP. The risks, benefits, and alternatives have been discussed with the patient in detail. The patient states understanding and desires to proceed.  ASA  III Hold iron x7 days prior to procedure. 3.  Advised that she has any recurrent large-volume rectal bleeding, feels lightheaded, dizzy, or like she would pass  out, she should proceed to the emergency room.  

## 2020-04-25 ENCOUNTER — Other Ambulatory Visit: Payer: Self-pay

## 2020-04-25 ENCOUNTER — Ambulatory Visit: Payer: Medicare Other | Admitting: Gastroenterology

## 2020-04-25 ENCOUNTER — Encounter: Payer: Self-pay | Admitting: Gastroenterology

## 2020-04-25 VITALS — BP 131/59 | HR 62 | Temp 97.3°F | Ht 62.5 in | Wt 142.8 lb

## 2020-04-25 DIAGNOSIS — D649 Anemia, unspecified: Secondary | ICD-10-CM

## 2020-04-25 DIAGNOSIS — K625 Hemorrhage of anus and rectum: Secondary | ICD-10-CM | POA: Diagnosis not present

## 2020-04-25 MED ORDER — CLENPIQ 10-3.5-12 MG-GM -GM/160ML PO SOLN: 1.0000 | Freq: Once | ORAL | 0 refills | Status: AC

## 2020-04-25 NOTE — Patient Instructions (Addendum)
Please go to [facility] to have blood work completed.   [address], [place], [place] [address]  We will arrange for you to have a colonoscopy in the near future with Dr. Abbey Chatters.   If you continue to have any further large volume rectal bleeding, lightheadedness, dizziness, feeling like you will pass out, you should proceed to the emergency room.   Aliene Altes, PA-C [facility]

## 2020-04-25 NOTE — Progress Notes (Signed)
CC'ED TO PCP 

## 2020-04-26 ENCOUNTER — Telehealth: Payer: Self-pay

## 2020-04-26 NOTE — Patient Instructions (Signed)
Martha Richard  04/26/2020     @PREFPERIOPPHARMACY @   Your procedure is scheduled on  05/01/2020.   Report to [facility] at  [place]   Call this number if you have problems the morning of surgery:  [phone]   Remember:  Follow the diet and prep instructions given to you by the office.                        Take these medicines the morning of surgery with A SIP OF WATER  None    Please brush your teeth.  Do not wear jewelry, make-up or nail polish.  Do not wear lotions, powders, or perfumes, or deodorant.  Do not shave 48 hours prior to surgery.  Men may shave face and neck.  Do not bring valuables to the hospital.  [facility] is not responsible for any belongings or valuables.  Contacts, dentures or bridgework may not be worn into surgery.  Leave your suitcase in the car.  After surgery it may be brought to your room.  For patients admitted to the hospital, discharge time will be determined by your treatment team.  Patients discharged the day of surgery will not be allowed to drive home and must have someone with them for 24 hours.   Special instructions:   DO NOT smoke tobacco or vape the morning of your procedure.   Please read over the following fact sheets that you were given. Anesthesia Post-op Instructions and Care and Recovery After Surgery       Colonoscopy, Adult, Care After This sheet gives you information about how to care for yourself after your procedure. Your health care provider may also give you more specific instructions. If you have problems or questions, contact your health care provider. What can I expect after the procedure? After the procedure, it is common to have:  A small amount of blood in your stool for 24 hours after the procedure.  Some gas.  Mild cramping or bloating of your abdomen. Follow these instructions at home: Eating and drinking  Drink enough fluid to keep your urine pale yellow.  Follow instructions  from your health care provider about eating or drinking restrictions.  Resume your normal diet as instructed by your health care provider. Avoid heavy or fried foods that are hard to digest.   Activity  Rest as told by your health care provider.  Avoid sitting for a long time without moving. Get up to take short walks every 1-2 hours. This is important to improve blood flow and breathing. Ask for help if you feel weak or unsteady.  Return to your normal activities as told by your health care provider. Ask your health care provider what activities are safe for you. Managing cramping and bloating  Try walking around when you have cramps or feel bloated.  Apply heat to your abdomen as told by your health care provider. Use the heat source that your health care provider recommends, such as a moist heat pack or a heating pad. ? Place a towel between your skin and the heat source. ? Leave the heat on for 20-30 minutes. ? Remove the heat if your skin turns bright red. This is especially important if you are unable to feel pain, heat, or cold. You may have a greater risk of getting burned.   General instructions  If you were given a sedative during the procedure, it can  affect you for several hours. Do not drive or operate machinery until your health care provider says that it is safe.  For the first 24 hours after the procedure: ? Do not sign important documents. ? Do not drink alcohol. ? Do your regular daily activities at a slower pace than normal. ? Eat soft foods that are easy to digest.  Take over-the-counter and prescription medicines only as told by your health care provider.  Keep all follow-up visits as told by your health care provider. This is important. Contact a health care provider if:  You have blood in your stool 2-3 days after the procedure. Get help right away if you have:  More than a small spotting of blood in your stool.  Large blood clots in your  stool.  Swelling of your abdomen.  Nausea or vomiting.  A fever.  Increasing pain in your abdomen that is not relieved with medicine. Summary  After the procedure, it is common to have a small amount of blood in your stool. You may also have mild cramping and bloating of your abdomen.  If you were given a sedative during the procedure, it can affect you for several hours. Do not drive or operate machinery until your health care provider says that it is safe.  Get help right away if you have a lot of blood in your stool, nausea or vomiting, a fever, or increased pain in your abdomen. This information is not intended to replace advice given to you by your health care provider. Make sure you discuss any questions you have with your health care provider. Document Revised: 02/04/2019 Document Reviewed: 09/06/2018 Elsevier Patient Education  2021 [place] After This sheet gives you information about how to care for yourself after your procedure. Your health care provider may also give you more specific instructions. If you have problems or questions, contact your health care provider. What can I expect after the procedure? After the procedure, it is common to have:  Tiredness.  Forgetfulness about what happened after the procedure.  Impaired judgment for important decisions.  Nausea or vomiting.  Some difficulty with balance. Follow these instructions at home: For the time period you were told by your health care provider:  Rest as needed.  Do not participate in activities where you could fall or become injured.  Do not drive or use machinery.  Do not drink alcohol.  Do not take sleeping pills or medicines that cause drowsiness.  Do not make important decisions or sign legal documents.  Do not take care of children on your own.      Eating and drinking  Follow the diet that is recommended by your health care provider.  Drink  enough fluid to keep your urine pale yellow.  If you vomit: ? Drink water, juice, or soup when you can drink without vomiting. ? Make sure you have little or no nausea before eating solid foods. General instructions  Have a responsible adult stay with you for the time you are told. It is important to have someone help care for you until you are awake and alert.  Take over-the-counter and prescription medicines only as told by your health care provider.  If you have sleep apnea, surgery and certain medicines can increase your risk for breathing problems. Follow instructions from your health care provider about wearing your sleep device: ? Anytime you are sleeping, including during daytime naps. ? While taking prescription pain medicines, sleeping medicines, or  medicines that make you drowsy.  Avoid smoking.  Keep all follow-up visits as told by your health care provider. This is important. Contact a health care provider if:  You keep feeling nauseous or you keep vomiting.  You feel light-headed.  You are still sleepy or having trouble with balance after 24 hours.  You develop a rash.  You have a fever.  You have redness or swelling around the IV site. Get help right away if:  You have trouble breathing.  You have new-onset confusion at home. Summary  For several hours after your procedure, you may feel tired. You may also be forgetful and have poor judgment.  Have a responsible adult stay with you for the time you are told. It is important to have someone help care for you until you are awake and alert.  Rest as told. Do not drive or operate machinery. Do not drink alcohol or take sleeping pills.  Get help right away if you have trouble breathing, or if you suddenly become confused. This information is not intended to replace advice given to you by your health care provider. Make sure you discuss any questions you have with your health care provider. Document Revised:  10/27/2019 Document Reviewed: 01/13/2019 Elsevier Patient Education  2021 [employer].

## 2020-04-26 NOTE — Telephone Encounter (Signed)
Called and informed pt of pre-op appt/covid test 04/27/20 at 8:15am.

## 2020-04-27 ENCOUNTER — Encounter (HOSPITAL_COMMUNITY): Payer: Self-pay

## 2020-04-27 ENCOUNTER — Encounter (HOSPITAL_COMMUNITY)
Admission: RE | Admit: 2020-04-27 | Discharge: 2020-04-27 | Disposition: A | Discharge status: Home / Self Care | Payer: Medicare Other | Source: Ambulatory Visit | Attending: Internal Medicine | Admitting: Internal Medicine

## 2020-04-27 ENCOUNTER — Other Ambulatory Visit: Payer: Self-pay

## 2020-04-27 ENCOUNTER — Other Ambulatory Visit (HOSPITAL_COMMUNITY)
Admission: RE | Admit: 2020-04-27 | Discharge: 2020-04-27 | Disposition: A | Discharge status: Home / Self Care | Payer: Medicare Other | Source: Ambulatory Visit | Attending: Internal Medicine | Admitting: Internal Medicine

## 2020-04-27 DIAGNOSIS — Z20822 Contact with and (suspected) exposure to covid-19: Secondary | ICD-10-CM | POA: Diagnosis not present

## 2020-04-27 DIAGNOSIS — Z01812 Encounter for preprocedural laboratory examination: Secondary | ICD-10-CM | POA: Insufficient documentation

## 2020-04-27 LAB — HEMOGLOBIN AND HEMATOCRIT, BLOOD
HCT: 32.8 % — ABNORMAL LOW (ref 36.0–46.0)
Hemoglobin: 10.1 g/dL — ABNORMAL LOW (ref 12.0–15.0)

## 2020-04-27 LAB — SARS CORONAVIRUS 2 (TAT 6-24 HRS): SARS Coronavirus 2: NEGATIVE

## 2020-05-01 ENCOUNTER — Ambulatory Visit (HOSPITAL_COMMUNITY): Payer: Medicare Other | Admitting: Anesthesiology

## 2020-05-01 ENCOUNTER — Encounter (HOSPITAL_COMMUNITY): Payer: Self-pay

## 2020-05-01 ENCOUNTER — Encounter (HOSPITAL_COMMUNITY)
Admission: RE | Disposition: A | Discharge status: Home / Self Care | Payer: Self-pay | Source: Home / Self Care | Attending: Internal Medicine

## 2020-05-01 ENCOUNTER — Ambulatory Visit (HOSPITAL_COMMUNITY)
Admission: RE | Admit: 2020-05-01 | Discharge: 2020-05-01 | Disposition: A | Discharge status: Home / Self Care | Payer: Medicare Other | Attending: Internal Medicine | Admitting: Internal Medicine

## 2020-05-01 DIAGNOSIS — Z888 Allergy status to other drugs, medicaments and biological substances status: Secondary | ICD-10-CM | POA: Diagnosis not present

## 2020-05-01 DIAGNOSIS — Z880 Allergy status to penicillin: Secondary | ICD-10-CM | POA: Diagnosis not present

## 2020-05-01 DIAGNOSIS — K635 Polyp of colon: Secondary | ICD-10-CM

## 2020-05-01 DIAGNOSIS — K921 Melena: Secondary | ICD-10-CM | POA: Insufficient documentation

## 2020-05-01 DIAGNOSIS — K5520 Angiodysplasia of colon without hemorrhage: Secondary | ICD-10-CM | POA: Diagnosis not present

## 2020-05-01 DIAGNOSIS — Z885 Allergy status to narcotic agent status: Secondary | ICD-10-CM | POA: Insufficient documentation

## 2020-05-01 DIAGNOSIS — K5730 Diverticulosis of large intestine without perforation or abscess without bleeding: Secondary | ICD-10-CM | POA: Diagnosis not present

## 2020-05-01 DIAGNOSIS — D123 Benign neoplasm of transverse colon: Secondary | ICD-10-CM | POA: Diagnosis not present

## 2020-05-01 DIAGNOSIS — K648 Other hemorrhoids: Secondary | ICD-10-CM | POA: Insufficient documentation

## 2020-05-01 DIAGNOSIS — Z886 Allergy status to analgesic agent status: Secondary | ICD-10-CM | POA: Diagnosis not present

## 2020-05-01 DIAGNOSIS — I447 Left bundle-branch block, unspecified: Secondary | ICD-10-CM | POA: Diagnosis not present

## 2020-05-01 DIAGNOSIS — Z87891 Personal history of nicotine dependence: Secondary | ICD-10-CM | POA: Insufficient documentation

## 2020-05-01 HISTORY — PX: HOT HEMOSTASIS: SHX5433

## 2020-05-01 HISTORY — PX: POLYPECTOMY: SHX5525

## 2020-05-01 HISTORY — PX: COLONOSCOPY WITH PROPOFOL: SHX5780

## 2020-05-01 SURGERY — COLONOSCOPY WITH PROPOFOL
Anesthesia: General

## 2020-05-01 MED ORDER — PROPOFOL 500 MG/50ML IV EMUL
INTRAVENOUS | Status: DC | PRN
  Administered 2020-05-01: 100 ug/kg/min via INTRAVENOUS

## 2020-05-01 MED ORDER — PROPOFOL 10 MG/ML IV BOLUS
INTRAVENOUS | Status: DC | PRN
  Administered 2020-05-01: 60 mg via INTRAVENOUS
  Administered 2020-05-01 (×2): 20 mg via INTRAVENOUS
  Administered 2020-05-01: 10 mg via INTRAVENOUS

## 2020-05-01 MED ORDER — STERILE WATER FOR IRRIGATION IR SOLN
Status: DC | PRN
  Administered 2020-05-01: 100 mL

## 2020-05-01 MED ORDER — HYDRALAZINE HCL 20 MG/ML IJ SOLN: INTRAMUSCULAR | Status: DC | PRN

## 2020-05-01 MED ORDER — HYDRALAZINE HCL 20 MG/ML IJ SOLN
INTRAMUSCULAR | Status: DC | PRN
  Administered 2020-05-01: 5 mg via INTRAVENOUS

## 2020-05-01 MED ORDER — LACTATED RINGERS IV SOLN: INTRAVENOUS | Status: DC

## 2020-05-01 NOTE — Discharge Instructions (Addendum)
Colonoscopy Discharge Instructions  Read the instructions outlined below and refer to this sheet in the next few weeks. These discharge instructions provide you with general information on caring for yourself after you leave the hospital. Your doctor may also give you specific instructions. While your treatment has been planned according to the most current medical practices available, unavoidable complications occasionally occur.   ACTIVITY  You may resume your regular activity, but move at a slower pace for the next 24 hours.   Take frequent rest periods for the next 24 hours.   Walking will help get rid of the air and reduce the bloated feeling in your belly (abdomen).   No driving for 24 hours (because of the medicine (anesthesia) used during the test).    Do not sign any important legal documents or operate any machinery for 24 hours (because of the anesthesia used during the test).  NUTRITION  Drink plenty of fluids.   You may resume your normal diet as instructed by your doctor.   Begin with a light meal and progress to your normal diet. Heavy or fried foods are harder to digest and may make you feel sick to your stomach (nauseated).   Avoid alcoholic beverages for 24 hours or as instructed.  MEDICATIONS  You may resume your normal medications unless your doctor tells you otherwise.  WHAT YOU CAN EXPECT TODAY  Some feelings of bloating in the abdomen.   Passage of more gas than usual.   Spotting of blood in your stool or on the toilet paper.  IF YOU HAD POLYPS REMOVED DURING THE COLONOSCOPY:  No aspirin products for 7 days or as instructed.   No alcohol for 7 days or as instructed.   Eat a soft diet for the next 24 hours.  FINDING OUT THE RESULTS OF YOUR TEST Not all test results are available during your visit. If your test results are not back during the visit, make an appointment with your caregiver to find out the results. Do not assume everything is normal if  you have not heard from your caregiver or the medical facility. It is important for you to follow up on all of your test results.  SEEK IMMEDIATE MEDICAL ATTENTION IF:  You have more than a spotting of blood in your stool.   Your belly is swollen (abdominal distention).   You are nauseated or vomiting.   You have a temperature over 101.   You have abdominal pain or discomfort that is severe or gets worse throughout the day.   Your colonoscopy revealed internal hemorrhoids, diverticulosis, and AVM, all 3 which could cause your bleeding.  The AVM I treated with coagulation which hopefully will prevent further bleeding.  To help prevent bleeding from diverticulosis or hemorrhoids, I would recommend increasing fiber in your diet. I would recommend increasing fiber in your diet or adding OTC Benefiber/Metamucil. Be sure to drink at least 4 to 6 glasses of water daily. Follow-up with GI in 2 months.  You did have 1 polyp which I removed successfully.  Await pathology results, my office will contact you.  I hope you have a great rest of your week!  Elon Alas. Abbey Chatters, D.O. Gastroenterology and Hepatology [facility]   Hemorrhoids Hemorrhoids are swollen veins in and around the rectum or anus. There are two types of hemorrhoids:  Internal hemorrhoids. These occur in the veins that are just inside the rectum. They may poke through to the outside and become irritated and painful.  External hemorrhoids. These occur in the veins that are outside the anus and can be felt as a painful swelling or hard lump near the anus. Most hemorrhoids do not cause serious problems, and they can be managed with home treatments such as diet and lifestyle changes. If home treatments do not help the symptoms, procedures can be done to shrink or remove the hemorrhoids. What are the causes? This condition is caused by increased pressure in the anal area. This pressure may result from various  things, including:  Constipation.  Straining to have a bowel movement.  Diarrhea.  Pregnancy.  Obesity.  Sitting for long periods of time.  Heavy lifting or other activity that causes you to strain.  Anal sex.  Riding a bike for a long period of time. What are the signs or symptoms? Symptoms of this condition include:  Pain.  Anal itching or irritation.  Rectal bleeding.  Leakage of stool (feces).  Anal swelling.  One or more lumps around the anus. How is this diagnosed? This condition can often be diagnosed through a visual exam. Other exams or tests may also be done, such as:  An exam that involves feeling the rectal area with a gloved hand (digital rectal exam).  An exam of the anal canal that is done using a small tube (anoscope).  A blood test, if you have lost a significant amount of blood.  A test to look inside the colon using a flexible tube with a camera on the end (sigmoidoscopy or colonoscopy). How is this treated? This condition can usually be treated at home. However, various procedures may be done if dietary changes, lifestyle changes, and other home treatments do not help your symptoms. These procedures can help make the hemorrhoids smaller or remove them completely. Some of these procedures involve surgery, and others do not. Common procedures include:  Rubber band ligation. Rubber bands are placed at the base of the hemorrhoids to cut off their blood supply.  Sclerotherapy. Medicine is injected into the hemorrhoids to shrink them.  Infrared coagulation. A type of light energy is used to get rid of the hemorrhoids.  Hemorrhoidectomy surgery. The hemorrhoids are surgically removed, and the veins that supply them are tied off.  Stapled hemorrhoidopexy surgery. The surgeon staples the base of the hemorrhoid to the rectal wall. Follow these instructions at home: Eating and drinking  Eat foods that have a lot of fiber in them, such as whole  grains, beans, nuts, fruits, and vegetables.  Ask your health care provider about taking products that have added fiber (fiber supplements).  Reduce the amount of fat in your diet. You can do this by eating low-fat dairy products, eating less red meat, and avoiding processed foods.  Drink enough fluid to keep your urine pale yellow.   Managing pain and swelling  Take warm sitz baths for 20 minutes, 3-4 times a day to ease pain and discomfort. You may do this in a bathtub or using a portable sitz bath that fits over the toilet.  If directed, apply ice to the affected area. Using ice packs between sitz baths may be helpful. ? Put ice in a plastic bag. ? Place a towel between your skin and the bag. ? Leave the ice on for 20 minutes, 2-3 times a day.   General instructions  Take over-the-counter and prescription medicines only as told by your health care provider.  Use medicated creams or suppositories as told.  Get regular exercise. Ask your health care  provider how much and what kind of exercise is best for you. In general, you should do moderate exercise for at least 30 minutes on most days of the week (150 minutes each week). This can include activities such as walking, biking, or yoga.  Go to the bathroom when you have the urge to have a bowel movement. Do not wait.  Avoid straining to have bowel movements.  Keep the anal area dry and clean. Use wet toilet paper or moist towelettes after a bowel movement.  Do not sit on the toilet for long periods of time. This increases blood pooling and pain.  Keep all follow-up visits as told by your health care provider. This is important. Contact a health care provider if you have:  Increasing pain and swelling that are not controlled by treatment or medicine.  Difficulty having a bowel movement, or you are unable to have a bowel movement.  Pain or inflammation outside the area of the hemorrhoids. Get help right away if you  have:  Uncontrolled bleeding from your rectum. Summary  Hemorrhoids are swollen veins in and around the rectum or anus.  Most hemorrhoids can be managed with home treatments such as diet and lifestyle changes.  Taking warm sitz baths can help ease pain and discomfort.  In severe cases, procedures or surgery can be done to shrink or remove the hemorrhoids. This information is not intended to replace advice given to you by your health care provider. Make sure you discuss any questions you have with your health care provider. Document Revised: 07/09/2018 Document Reviewed: 07/02/2017 Elsevier Patient Education  2021 [employer].  Diverticulosis  Diverticulosis is a condition that develops when small pouches (diverticula) form in the wall of the large intestine (colon). The colon is where water is absorbed and stool (feces) is formed. The pouches form when the inside layer of the colon pushes through weak spots in the outer layers of the colon. You may have a few pouches or many of them. The pouches usually do not cause problems unless they become inflamed or infected. When this happens, the condition is called diverticulitis. What are the causes? The cause of this condition is not known. What increases the risk? The following factors may make you more likely to develop this condition:  Being older than age 80. Your risk for this condition increases with age. Diverticulosis is rare among people younger than age 13. By age 61, many people have it.  Eating a low-fiber diet.  Having frequent constipation.  Being overweight.  Not getting enough exercise.  Smoking.  Taking over-the-counter pain medicines, like aspirin and ibuprofen.  Having a family history of diverticulosis. What are the signs or symptoms? In most people, there are no symptoms of this condition. If you do have symptoms, they may include:  Bloating.  Cramps in the abdomen.  Constipation or diarrhea.  Pain in  the lower left side of the abdomen. How is this diagnosed? Because diverticulosis usually has no symptoms, it is most often diagnosed during an exam for other colon problems. The condition may be diagnosed by:  Using a flexible scope to examine the colon (colonoscopy).  Taking an X-ray of the colon after dye has been put into the colon (barium enema).  Having a CT scan. How is this treated? You may not need treatment for this condition. Your health care provider may recommend treatment to prevent problems. You may need treatment if you have symptoms or if you previously had diverticulitis. Treatment  may include:  Eating a high-fiber diet.  Taking a fiber supplement.  Taking a live bacteria supplement (probiotic).  Taking medicine to relax your colon.   Follow these instructions at home: Medicines  Take over-the-counter and prescription medicines only as told by your health care provider.  If told by your health care provider, take a fiber supplement or probiotic. Constipation prevention Your condition may cause constipation. To prevent or treat constipation, you may need to:  Drink enough fluid to keep your urine pale yellow.  Take over-the-counter or prescription medicines.  Eat foods that are high in fiber, such as beans, whole grains, and fresh fruits and vegetables.  Limit foods that are high in fat and processed sugars, such as fried or sweet foods.   General instructions  Try not to strain when you have a bowel movement.  Keep all follow-up visits as told by your health care provider. This is important. Contact a health care provider if you:  Have pain in your abdomen.  Have bloating.  Have cramps.  Have not had a bowel movement in 3 days. Get help right away if:  Your pain gets worse.  Your bloating becomes very bad.  You have a fever or chills, and your symptoms suddenly get worse.  You vomit.  You have bowel movements that are bloody or  black.  You have bleeding from your rectum. Summary  Diverticulosis is a condition that develops when small pouches (diverticula) form in the wall of the large intestine (colon).  You may have a few pouches or many of them.  This condition is most often diagnosed during an exam for other colon problems.  Treatment may include increasing the fiber in your diet, taking supplements, or taking medicines. This information is not intended to replace advice given to you by your health care provider. Make sure you discuss any questions you have with your health care provider. Document Revised: 09/09/2018 Document Reviewed: 09/09/2018 Elsevier Patient Education  [employer].  Colon Polyps  Colon polyps are tissue growths inside the colon, which is part of the large intestine. They are one of the types of polyps that can grow in the body. A polyp may be a round bump or a mushroom-shaped growth. You could have one polyp or more than one. Most colon polyps are noncancerous (benign). However, some colon polyps can become cancerous over time. Finding and removing the polyps early can help prevent this. What are the causes? The exact cause of colon polyps is not known. What increases the risk? The following factors may make you more likely to develop this condition:  Having a family history of colorectal cancer or colon polyps.  Being older than 85 years of age.  Being younger than 85 years of age and having a significant family history of colorectal cancer or colon polyps or a genetic condition that puts you at higher risk of getting colon polyps.  Having inflammatory bowel disease, such as ulcerative colitis or Crohn's disease.  Having certain conditions passed from parent to child (hereditary conditions), such as: ? Familial adenomatous polyposis (FAP). ? Lynch syndrome. ? [place] syndrome. ? Peutz-Jeghers syndrome. ? MUTYH-associated polyposis (MAP).  Being overweight.  Certain  lifestyle factors. These include smoking cigarettes, drinking too much alcohol, not getting enough exercise, and eating a diet that is high in fat and red meat and low in fiber.  Having had childhood cancer that was treated with radiation of the abdomen. What are the signs or symptoms? Many  times, there are no symptoms. If you have symptoms, they may include:  Blood coming from the rectum during a bowel movement.  Blood in the stool (feces). The blood may be bright red or very dark in color.  Pain in the abdomen.  A change in bowel habits, such as constipation or diarrhea. How is this diagnosed? This condition is diagnosed with a colonoscopy. This is a procedure in which a lighted, flexible scope is inserted into the opening between the buttocks (anus) and then passed into the colon to examine the area. Polyps are sometimes found when a colonoscopy is done as part of routine cancer screening tests. How is this treated? This condition is treated by removing any polyps that are found. Most polyps can be removed during a colonoscopy. Those polyps will then be tested for cancer. Additional treatment may be needed depending on the results of testing. Follow these instructions at home: Eating and drinking  Eat foods that are high in fiber, such as fruits, vegetables, and whole grains.  Eat foods that are high in calcium and vitamin D, such as milk, cheese, yogurt, eggs, liver, fish, and broccoli.  Limit foods that are high in fat, such as fried foods and desserts.  Limit the amount of red meat, precooked or cured meat, or other processed meat that you eat, such as hot dogs, sausages, bacon, or meat loaves.  Limit sugary drinks.   Lifestyle  Maintain a healthy weight, or lose weight if recommended by your health care provider.  Exercise every day or as told by your health care provider.  Do not use any products that contain nicotine or tobacco, such as cigarettes, e-cigarettes, and  chewing tobacco. If you need help quitting, ask your health care provider.  Do not drink alcohol if: ? Your health care provider tells you not to drink. ? You are pregnant, may be pregnant, or are planning to become pregnant.  If you drink alcohol: ? Limit how much you use to:  0-1 drink a day for women.  0-2 drinks a day for men. ? Know how much alcohol is in your drink. In the [place] one drink equals one 12 oz bottle of beer (355 mL), one 5 oz glass of wine (148 mL), or one 1 oz glass of hard liquor (44 mL). General instructions  Take over-the-counter and prescription medicines only as told by your health care provider.  Keep all follow-up visits. This is important. This includes having regularly scheduled colonoscopies. Talk to your health care provider about when you need a colonoscopy. Contact a health care provider if:  You have new or worsening bleeding during a bowel movement.  You have new or increased blood in your stool.  You have a change in bowel habits.  You lose weight for no known reason. Summary  Colon polyps are tissue growths inside the colon, which is part of the large intestine. They are one type of polyp that can grow in the body.  Most colon polyps are noncancerous (benign), but some can become cancerous over time.  This condition is diagnosed with a colonoscopy.  This condition is treated by removing any polyps that are found. Most polyps can be removed during a colonoscopy. This information is not intended to replace advice given to you by your health care provider. Make sure you discuss any questions you have with your health care provider. Document Revised: 06/01/2019 Document Reviewed: 06/01/2019 Elsevier Patient Education  2021 [employer].

## 2020-05-01 NOTE — Anesthesia Postprocedure Evaluation (Signed)
Anesthesia Post Note  Patient: Martha Richard  Procedure(s) Performed: COLONOSCOPY WITH PROPOFOL (N/A ) POLYPECTOMY HOT HEMOSTASIS (ARGON PLASMA COAGULATION/BICAP)  Patient location during evaluation: Phase II Anesthesia Type: General Level of consciousness: awake and alert Pain management: satisfactory to patient Vital Signs Assessment: post-procedure vital signs reviewed and stable Respiratory status: spontaneous breathing and respiratory function stable Cardiovascular status: stable Postop Assessment: no apparent nausea or vomiting Anesthetic complications: no   No complications documented.   Last Vitals:  Vitals:   05/01/20 0916 05/01/20 1115  BP: (!) 206/74 (!) 172/70  Pulse:    Resp:  20  Temp:  37.1 C  SpO2:  100%    Last Pain:  Vitals:   05/01/20 1115  TempSrc: Oral  PainSc: 0-No pain                 Karna Dupes

## 2020-05-01 NOTE — Op Note (Signed)
[facility] Patient Name: Martha Richard Procedure Date: 05/01/2020 10:31 AM MRN: [id number] Date of Birth: November 08, 1929 Attending MD: Elon Alas. Abbey Chatters DO CSN: [id number] Age: 87 Admit Type: Outpatient Procedure:                Colonoscopy Indications:              Hematochezia Providers:                Elon Alas. Abbey Chatters, DO, Lambert Mody, Aram Candela Referring MD:              Medicines:                See the Anesthesia note for documentation of the                            administered medications Complications:            No immediate complications. Estimated Blood Loss:     Estimated blood loss was minimal. Procedure:                Pre-Anesthesia Assessment:                           - The anesthesia plan was to use monitored                            anesthesia care (MAC).                           After obtaining informed consent, the colonoscope                            was passed under direct vision. Throughout the                            procedure, the patient's blood pressure, pulse, and                            oxygen saturations were monitored continuously. The                            [id number]-HQ190L ([id number]) scope was introduced through                            the anus and advanced to the the cecum, identified                            by appendiceal orifice and ileocecal valve. The                            colonoscopy was performed without difficulty. The                            patient tolerated the procedure well. The quality  of the bowel preparation was evaluated using the                            BBPS [facility] Bowel Preparation Scale) with scores                            of: Right Colon = 3, Transverse Colon = 3 and Left                            Colon = 3 (entire mucosa seen well with no residual                            staining, small fragments of stool or opaque                             liquid). The total BBPS score equals 9. Scope In: 10:52:03 AM Scope Out: 11:12:30 AM Scope Withdrawal Time: 0 hours 12 minutes 27 seconds  Total Procedure Duration: 0 hours 20 minutes 27 seconds  Findings:      Non-bleeding internal hemorrhoids were found during retroflexion.      Many small and large-mouthed diverticula were found in the entire colon.      A 7 mm polyp was found in the transverse colon. The polyp was sessile.       The polyp was removed with a cold snare. Resection and retrieval were       complete.      A single medium-sized angioectasia without bleeding was found in the       ascending colon. Coagulation for bleeding prevention using argon plasma       at 0.5 liters/minute and 20 watts was successful.      The exam was otherwise without abnormality. Impression:               - Non-bleeding internal hemorrhoids.                           - Diverticulosis in the entire examined colon.                           - One 7 mm polyp in the transverse colon, removed                            with a cold snare. Resected and retrieved.                           - A single non-bleeding colonic angioectasia.                            Treated with argon plasma coagulation (APC).                           - The examination was otherwise normal. Moderate Sedation:      Per Anesthesia Care Recommendation:           - Patient has a contact number available for  emergencies. The signs and symptoms of potential                            delayed complications were discussed with the                            patient. Return to normal activities tomorrow.                            Written discharge instructions were provided to the                            patient.                           - Resume previous diet.                           - Continue present medications.                           - Await pathology results.                            - No repeat colonoscopy.                           - Return to GI clinic in 3 months.                           - Etiology of patient's bleeding could have been                            hemorrhoidal, diverticular, or due to AVM (treated                            today).                           - Use fiber, for example Citrucel, Fibercon, Konsyl                            or Metamucil. Procedure Code(s):        --- Professional ---                           [phone], 59, Colonoscopy, flexible; with control of                            bleeding, any method                           45385, Colonoscopy, flexible; with removal of                            tumor(s), polyp(s), or other lesion(s) by snare  technique Diagnosis Code(s):        --- Professional ---                           K64.8, Other hemorrhoids                           K63.5, Polyp of colon                           K55.20, Angiodysplasia of colon without hemorrhage                           K92.1, Melena (includes Hematochezia)                           K57.30, Diverticulosis of large intestine without                            perforation or abscess without bleeding CPT copyright 2019 American Medical Association. All rights reserved. The codes documented in this report are preliminary and upon coder review may  be revised to meet current compliance requirements. Elon Alas. Abbey Chatters, DO [place] Abbey Chatters, DO 05/01/2020 11:19:47 AM This report has been signed electronically. Number of Addenda: 0

## 2020-05-01 NOTE — Transfer of Care (Signed)
Immediate Anesthesia Transfer of Care Note  Patient: Martha Richard  Procedure(s) Performed: COLONOSCOPY WITH PROPOFOL (N/A ) POLYPECTOMY HOT HEMOSTASIS (ARGON PLASMA COAGULATION/BICAP)  Patient Location: PACU  Anesthesia Type:General  Level of Consciousness: awake and alert   Airway & Oxygen Therapy: Patient Spontanous Breathing  Post-op Assessment: Report given to RN and Post -op Vital signs reviewed and stable  Post vital signs: Reviewed and stable  Last Vitals:  Vitals Value Taken Time  BP 172/70 05/01/20 1115  Temp 37.1 C 05/01/20 1115  Pulse    Resp 20 05/01/20 1115  SpO2 100 % 05/01/20 1115    Last Pain:  Vitals:   05/01/20 1115  TempSrc: Oral  PainSc: 0-No pain      Patients Stated Pain Goal: 4 (76/54/65 [id number])  Complications: No complications documented.

## 2020-05-01 NOTE — Anesthesia Preprocedure Evaluation (Signed)
Anesthesia Evaluation  Patient identified by MRN, date of birth, ID band Patient awake    Reviewed: Allergy & Precautions, NPO status , Patient's Chart, lab work & pertinent test results  History of Anesthesia Complications (+) PROLONGED EMERGENCE and history of anesthetic complications  Airway Mallampati: II  TM Distance: >3 FB Neck ROM: Full    Dental  (+) Dental Advisory Given, Partial Lower   Pulmonary shortness of breath and with exertion, former smoker,    Pulmonary exam normal breath sounds clear to auscultation       Cardiovascular hypertension, Pt. on medications + dysrhythmias  Rhythm:Regular Rate:Normal + Systolic murmurs and + Diastolic murmurs Sinus  Bradycardia  -Left bundle branch block. 1. Left ventricular ejection fraction, by estimation, is 55 to 60%. The  left ventricle has normal function. The left ventricle has no regional  wall motion abnormalities. Left ventricular diastolic parameters are  consistent with Grade I diastolic  dysfunction (impaired relaxation). Elevated left ventricular end-diastolic  pressure.  2. Right ventricular systolic function is low normal. The right  ventricular size is normal. Mildly increased right ventricular wall  thickness. There is moderately elevated pulmonary artery systolic  pressure.  3. Left atrial size was mildly dilated.  4. Small circumferential pericardial effusion without hemodynamic  compromise. The pericardial effusion is circumferential. There is no  evidence of cardiac tamponade.  5. The mitral valve is degenerative. Moderate mitral valve regurgitation.  6. Tricuspid valve regurgitation is mild to moderate.  7. The aortic valve is tricuspid. Aortic valve regurgitation is mild.  Moderate aortic valve stenosis. Aortic valve area, by VTI measures 1.25  cm. Aortic valve mean gradient measures 24.2 mmHg. Aortic valve Vmax  measures 3.21 m/s.  8. The  inferior vena cava is normal in size with greater than 50%  respiratory variability, suggesting right atrial pressure of 3 mmHg.   Comparison(s): Echocardiogram done 04/21/18 showed an EF of 65% with  moderate AS adn an AV Peak Grad od 34.6 mmHg. A trivial circumferencial  pericardial effusion was present.     Neuro/Psych  Headaches, PSYCHIATRIC DISORDERS Depression    GI/Hepatic negative GI ROS, Neg liver ROS,   Endo/Other  negative endocrine ROS  Renal/GU negative Renal ROS     Musculoskeletal  (+) Arthritis ,   Abdominal   Peds  Hematology  (+) anemia ,   Anesthesia Other Findings   Reproductive/Obstetrics                             Anesthesia Physical Anesthesia Plan  ASA: III  Anesthesia Plan: General   Post-op Pain Management:    Induction: Intravenous  PONV Risk Score and Plan: Propofol infusion  Airway Management Planned: Nasal Cannula and Natural Airway  Additional Equipment:   Intra-op Plan:   Post-operative Plan:   Informed Consent: I have reviewed the patients History and Physical, chart, labs and discussed the procedure including the risks, benefits and alternatives for the proposed anesthesia with the patient or authorized representative who has indicated his/her understanding and acceptance.     Dental advisory given  Plan Discussed with: CRNA and Surgeon  Anesthesia Plan Comments:         Anesthesia Quick Evaluation

## 2020-05-01 NOTE — Interval H&P Note (Signed)
History and Physical Interval Note:  05/01/2020 10:35 AM  [name]  has presented today for surgery, with the diagnosis of rectal bleeding, anemia.  The various methods of treatment have been discussed with the patient and family. After consideration of risks, benefits and other options for treatment, the patient has consented to  Procedure(s) with comments: COLONOSCOPY WITH PROPOFOL (N/A) - AM as a surgical intervention.  The patient's history has been reviewed, patient examined, no change in status, stable for surgery.  I have reviewed the patient's chart and labs.  Questions were answered to the patient's satisfaction.     Martha Richard

## 2020-05-02 LAB — SURGICAL PATHOLOGY

## 2020-05-04 ENCOUNTER — Encounter (HOSPITAL_COMMUNITY): Payer: Self-pay | Admitting: Internal Medicine

## 2020-05-24 DIAGNOSIS — R011 Cardiac murmur, unspecified: Secondary | ICD-10-CM | POA: Diagnosis not present

## 2020-05-24 DIAGNOSIS — D649 Anemia, unspecified: Secondary | ICD-10-CM | POA: Diagnosis not present

## 2020-05-24 DIAGNOSIS — I10 Essential (primary) hypertension: Secondary | ICD-10-CM | POA: Diagnosis not present

## 2020-06-13 DIAGNOSIS — Z23 Encounter for immunization: Secondary | ICD-10-CM | POA: Diagnosis not present

## 2020-06-28 ENCOUNTER — Encounter: Payer: Self-pay | Admitting: Gastroenterology

## 2020-08-02 ENCOUNTER — Ambulatory Visit: Payer: Medicare Other | Admitting: Gastroenterology

## 2020-08-24 DIAGNOSIS — K2100 Gastro-esophageal reflux disease with esophagitis, without bleeding: Secondary | ICD-10-CM | POA: Diagnosis not present

## 2020-08-24 DIAGNOSIS — R7301 Impaired fasting glucose: Secondary | ICD-10-CM | POA: Diagnosis not present

## 2020-08-24 DIAGNOSIS — D649 Anemia, unspecified: Secondary | ICD-10-CM | POA: Diagnosis not present

## 2020-08-24 DIAGNOSIS — E7849 Other hyperlipidemia: Secondary | ICD-10-CM | POA: Diagnosis not present

## 2020-08-24 DIAGNOSIS — I10 Essential (primary) hypertension: Secondary | ICD-10-CM | POA: Diagnosis not present

## 2020-08-24 DIAGNOSIS — E782 Mixed hyperlipidemia: Secondary | ICD-10-CM | POA: Diagnosis not present

## 2020-08-30 DIAGNOSIS — D649 Anemia, unspecified: Secondary | ICD-10-CM | POA: Diagnosis not present

## 2020-08-30 DIAGNOSIS — I10 Essential (primary) hypertension: Secondary | ICD-10-CM | POA: Diagnosis not present

## 2020-08-30 DIAGNOSIS — R011 Cardiac murmur, unspecified: Secondary | ICD-10-CM | POA: Diagnosis not present

## 2020-10-31 NOTE — Progress Notes (Signed)
Referring Provider: Manon Hilding, MD Primary Care Physician:  Manon Hilding, MD Primary GI Physician: Dr. Abbey Chatters  Chief Complaint  Patient presents with   Rectal Bleeding    Doing ok    HPI:   Martha Richard is a 87 y.o. female presenting today for follow-up of rectal bleeding and anemia s/p colonoscopy.  Patient was initially evaluated at [facility] in February 2022 for rectal bleeding.  She was found to have anemia initially 11.9, but upon repeat visit latter part of February, hemoglobin 10.5.  She had CTA identified, 2.4 cm uterine mass, likely fibroid, recommended correlating clinically for vaginal bleeding and consider pelvic ultrasound, small left adrenal adenoma, cardiomegaly with small pericardial effusion.    At the time of her office visit with [place] on 04/25/2020.  3 days prior to office visit, she reported waking in the middle of the night with painless rectal bleeding without bowel movements.  She had 6 total occurrences over a 2-day span.  1 day prior to her visit, she had a single episode of toilet tissue hematochezia.  Day of her office visit, she reported a bloody bowel movement.  Denied constipation or straining using MiraLAX every couple of days.  Denied abdominal pain, nausea, vomiting, or similar symptoms in the past.  Denied NSAIDs, blood thinners, and PCP just started oral iron.  No significant upper GI symptoms.  Plan to update H&H, proceed with colonoscopy.   Hemoglobin 10.1 on 04/27/20  Colonoscopy 05/01/2020: Nonbleeding internal hemorrhoids, pancolonic diverticulosis, one 7 mm tubular adenoma resected, single nonbleeding colonic angioectasia treated with APC.  No recommendations to repeat colonoscopy due to age.   Today: Stopped iron not long after colonoscopy.  PCP told her iron levels were fine.  She had recent blood work a couple months ago and was also told her levels looked good.  She has not had any recurrent rectal bleeding since her colonoscopy.  She  is feeling very well.  Constipation is currently managed with increasing her fluid intake and eating plenty of fruits and vegetables.  Felt MiraLAX was increasing her blood pressure.  Denies abdominal pain, diarrhea, melena, nausea, vomiting, GERD symptoms, dysphagia.   Past Medical History:  Diagnosis Date   Aortic stenosis    Arrhythmia    palpitations   Arthritis    Cancer (HCC)    squamous cell Left hand   Complication of anesthesia    slow to wake up   Depression    Lost a son to cancer   Headache    hx of migraines   Hypertension    Hypertensive retinopathy    OU   Left bundle branch block     Past Surgical History:  Procedure Laterality Date   APPENDECTOMY     CATARACT EXTRACTION Bilateral    OU about 10 yrs ago   COLONOSCOPY WITH PROPOFOL N/A 05/01/2020   Surgeon: Eloise Harman, DO;Nonbleeding internal hemorrhoids, pancolonic diverticulosis, 1 7 mm tubular adenoma resected, single nonbleeding colonic angioectasia treated with APC.  No recommendations to repeat colonoscopy due to age.   EYE SURGERY     laser   fibroadenoma left breast removal     HOT HEMOSTASIS  05/01/2020   Procedure: HOT HEMOSTASIS (ARGON PLASMA COAGULATION/BICAP);  Surgeon: Eloise Harman, DO;  [facility];  Service: Endoscopy;;   OTHER SURGICAL HISTORY     appendectomy   POLYPECTOMY  05/01/2020   Procedure: POLYPECTOMY;  Surgeon: Eloise Harman, DO;  [facility];  Service: Endoscopy;;   TOTAL HIP ARTHROPLASTY Left 06/18/2016   Procedure: LEFT TOTAL HIP ARTHROPLASTY ANTERIOR APPROACH;  Surgeon: Gaynelle Arabian, MD;  Location: [facility];  Service: Orthopedics;  Laterality: Left;   TOTAL HIP ARTHROPLASTY Right 05/05/2018   Procedure: TOTAL HIP ARTHROPLASTY ANTERIOR APPROACH;  Surgeon: Gaynelle Arabian, MD;  Location: [facility];  Service: Orthopedics;  Laterality: Right;   TOTAL KNEE ARTHROPLASTY Right 01/02/2020   Procedure: TOTAL KNEE ARTHROPLASTY;  Surgeon: Gaynelle Arabian,  MD;  Location: [facility];  Service: Orthopedics;  Laterality: Right;  28mn   UTERINE FIBROID SURGERY      Current Outpatient Medications  Medication Sig Dispense Refill   hydrochlorothiazide (HYDRODIURIL) 12.5 MG tablet Take 12.5 mg by mouth daily as needed (salt intake).     hydroxypropyl methylcellulose / hypromellose (ISOPTO TEARS / GONIOVISC) 2.5 % ophthalmic solution Place 1 drop into both eyes 3 (three) times daily as needed for dry eyes.     Current Facility-Administered Medications  Medication Dose Route Frequency Provider Last Rate Last Admin   Bevacizumab (AVASTIN) SOLN 1.25 mg  1.25 mg Intravitreal  ZBernarda Caffey MD   1.25 mg at 04/20/17 1138   Bevacizumab (AVASTIN) SOLN 1.25 mg  1.25 mg Intravitreal  ZBernarda Caffey MD   1.25 mg at 04/20/17 1455   Bevacizumab (AVASTIN) SOLN 1.25 mg  1.25 mg Intravitreal  ZBernarda Caffey MD   1.25 mg at 05/25/17 1345   Bevacizumab (AVASTIN) SOLN 1.25 mg  1.25 mg Intravitreal  ZBernarda Caffey MD   1.25 mg at 06/30/17 1351   Bevacizumab (AVASTIN) SOLN 1.25 mg  1.25 mg Intravitreal  ZBernarda Caffey MD   1.25 mg at 08/23/17 0034   Bevacizumab (AVASTIN) SOLN 1.25 mg  1.25 mg Intravitreal  ZBernarda Caffey MD   1.25 mg at 04/16/18 1349    Allergies as of 11/01/2020 - Review Complete 11/01/2020  Allergen Reaction Noted   Ace inhibitors Cough 05/09/2016   Caffeine Other (See Comments)    Nsaids Other (See Comments) 05/09/2016   Peanut-containing drug products Other (See Comments) 05/09/2016   Ultram [tramadol hcl] Other (See Comments) 05/09/2016   Penicillins Itching and Rash 05/09/2016    Family History  Problem Relation Age of Onset   Heart disease Mother    Heart attack Mother    Heart failure Mother    Stroke Father    Heart attack Maternal Aunt    Heart disease Maternal Aunt    Heart failure Maternal Aunt    Amblyopia Neg Hx    Blindness Neg Hx    Cataracts Neg Hx    Glaucoma Neg Hx    Macular degeneration Neg Hx    Retinal detachment Neg  Hx    Strabismus Neg Hx    Retinitis pigmentosa Neg Hx    Colon cancer Neg Hx     Social History   Socioeconomic History   Marital status: Married    Spouse name: Not on file   Number of children: Not on file   Years of education: Not on file   Highest education level: Not on file  Occupational History   Not on file  Tobacco Use   Smoking status: Former    Packs/day: 0.25    Years: 3.00    Pack years: 0.75    Types: Cigarettes    Quit date: 04/27/1964    Years since quitting: 56.5   Smokeless tobacco: Never   Tobacco comments:    years ago 571plus years  Vaping Use   Vaping Use: Never used  Substance and Sexual Activity   Alcohol use: No   Drug use: No   Sexual activity: Not Currently    Birth control/protection: Post-menopausal  Other Topics Concern   Not on file  Social History Narrative   Not on file   Social Determinants of Health   Financial Resource Strain: Not on file  Food Insecurity: Not on file  Transportation Needs: Not on file  Physical Activity: Not on file  Stress: Not on file  Social Connections: Not on file    Review of Systems: Gen: Denies fever, chills, cold or flulike symptoms, presyncope, syncope. CV: Denies chest pain or palpitations. Resp: Denies dyspnea or cough GI: See HPI Heme: See HPI  Physical Exam: BP (!) 157/71   Pulse (!) 55   Temp (!) 97.1 F (36.2 C) (Temporal)   Ht '5\' 3"'$  (1.6 m)   Wt 140 lb 3.2 oz (63.6 kg)   BMI 24.84 kg/m  General:   Alert and oriented. No distress noted. Pleasant and cooperative.  Head:  Normocephalic and atraumatic. Eyes:  Conjuctiva clear without scleral icterus. Heart:  S1, S2 present without murmurs appreciated. Lungs:  Clear to auscultation bilaterally. No wheezes, rales, or rhonchi. No distress.  Abdomen:  +BS, soft, non-tender and non-distended. No rebound or guarding. No HSM or masses noted. Msk:  Symmetrical without gross deformities. Normal posture. Extremities:  Without  edema. Neurologic:  Alert and  oriented x4 Psych:  Normal mood and affect.    Assessment: 87 year old female presenting today for follow-up of rectal bleeding and anemia s/p colonoscopy.  Also with chronic history of mild constipation.  Colonoscopy completed 05/01/2020 with nonbleeding internal hemorrhoids, pancolonic diverticulosis, one 7 mm tubular adenoma resected, single nonbleeding colonic angioectasia treated with APC.  No recommendations to repeat colonoscopy due to age.  Clinically, she is doing very well with no recurrent rectal bleeding.  Reports primary care told her to discontinue iron as her blood levels had returned to normal.  Last blood work within the last couple of months.  We will request this.  Constipation is fairly well managed with increased fluids and fruits and vegetables.  She felt MiraLAX was increasing her blood pressure.  Advised she may use Colace as needed if constipation returns.  As she has no other significant GI symptoms, we will follow-up as needed.   Plan: 1.  Request blood work from Dr. Quintin Alto. 2.  Patient is to monitor for recurrent rectal bleeding and let us know if this occurs. 3.  Benefiber daily. 4.  Continue drinking plenty of fluids and eating fruits and vegetables daily. 5.  May use Colace 100-200 mg daily as needed if constipation returns. 6.  Follow-up as needed.    Aliene Altes, PA-C [facility] 11/01/2020

## 2020-11-01 ENCOUNTER — Other Ambulatory Visit: Payer: Self-pay

## 2020-11-01 ENCOUNTER — Ambulatory Visit: Payer: Medicare Other | Admitting: Gastroenterology

## 2020-11-01 ENCOUNTER — Encounter: Payer: Self-pay | Admitting: Gastroenterology

## 2020-11-01 VITALS — BP 157/71 | HR 55 | Temp 97.1°F | Ht 63.0 in | Wt 140.2 lb

## 2020-11-01 DIAGNOSIS — K5900 Constipation, unspecified: Secondary | ICD-10-CM

## 2020-11-01 DIAGNOSIS — K625 Hemorrhage of anus and rectum: Secondary | ICD-10-CM

## 2020-11-01 DIAGNOSIS — D649 Anemia, unspecified: Secondary | ICD-10-CM | POA: Diagnosis not present

## 2020-11-01 NOTE — Patient Instructions (Signed)
I am requesting your recent blood work from Dr. Quintin Alto.  I will let you know if we need anything in addition to this once I have reviewed the results.  Continue to monitor for rectal bleeding and let us know if this returns.  For mild constipation, you may try Colace (docusate sodium) 100-200 mg daily as needed.  Continue drinking plenty of water and eating fruits and vegetables daily to maintain adequate fiber intake.  I also recommend you add Benefiber daily to help prevent constipation.  This is a fiber supplement.  We will plan to see you back as needed.  Do not hesitate to call if you have any GI problems.   It was great to see you today!  I am glad you are doing well!  Aliene Altes, PA-C [facility]

## 2020-11-09 ENCOUNTER — Encounter: Payer: Self-pay | Admitting: Cardiology

## 2020-11-09 ENCOUNTER — Ambulatory Visit: Payer: Medicare Other | Admitting: Cardiology

## 2020-11-09 NOTE — Progress Notes (Deleted)
Clinical Summary Martha Richard is a 87 y.o.female former patient of Dr Bronson Ing, this is our first visit together. Seen for the following medical problems  1.Aortic stenosis 05/2019 echo: LVEF 55-60%, no WMAs, grade I dd, mod MR, mild to mod TR. Mod AS mean grad 24 AVA VTI 1.25   2. Chronic LBBB    3.HTN    Past Medical History:  Diagnosis Date   Aortic stenosis    Arrhythmia    palpitations   Arthritis    Cancer (HCC)    squamous cell Left hand   Complication of anesthesia    slow to wake up   Depression    Lost a son to cancer   Headache    hx of migraines   Hypertension    Hypertensive retinopathy    OU   Left bundle [name] block      Allergies  Allergen Reactions   Ace Inhibitors Cough   Caffeine Other (See Comments)    "shoots my BP up"   Nsaids Other (See Comments)    GI upset     Peanut-Containing Drug Products Other (See Comments)    headaches   Ultram [Tramadol Hcl] Other (See Comments)    Headaches. Patient is unsure of this.   Penicillins Itching and Rash    Has patient had a PCN reaction causing immediate rash, facial/tongue/throat swelling, SOB or lightheadedness with hypotension: No Has patient had a PCN reaction causing severe rash involving mucus membranes or skin necrosis: No Has patient had a PCN reaction that required hospitalization No Has patient had a PCN reaction occurring within the last 10 years: No If all of the above answers are "NO", then may proceed with Cephalosporin use.  Tolerated Cephalosporin Date: 01/03/20.       Current Outpatient Medications  Medication Sig Dispense Refill   hydrochlorothiazide (HYDRODIURIL) 12.5 MG tablet Take 12.5 mg by mouth daily as needed (salt intake).     hydroxypropyl methylcellulose / hypromellose (ISOPTO TEARS / GONIOVISC) 2.5 % ophthalmic solution Place 1 drop into both eyes 3 (three) times daily as needed for dry eyes.     Current Facility-Administered Medications  Medication Dose  Route Frequency Provider Last Rate Last Admin   Bevacizumab (AVASTIN) SOLN 1.25 mg  1.25 mg Intravitreal  Bernarda Caffey, MD   1.25 mg at 04/20/17 1138   Bevacizumab (AVASTIN) SOLN 1.25 mg  1.25 mg Intravitreal  Bernarda Caffey, MD   1.25 mg at 04/20/17 1455   Bevacizumab (AVASTIN) SOLN 1.25 mg  1.25 mg Intravitreal  Bernarda Caffey, MD   1.25 mg at 05/25/17 1345   Bevacizumab (AVASTIN) SOLN 1.25 mg  1.25 mg Intravitreal  Bernarda Caffey, MD   1.25 mg at 06/30/17 1351   Bevacizumab (AVASTIN) SOLN 1.25 mg  1.25 mg Intravitreal  Bernarda Caffey, MD   1.25 mg at 08/23/17 0034   Bevacizumab (AVASTIN) SOLN 1.25 mg  1.25 mg Intravitreal  Bernarda Caffey, MD   1.25 mg at 04/16/18 1349     Past Surgical History:  Procedure Laterality Date   APPENDECTOMY     CATARACT EXTRACTION Bilateral    OU about 10 yrs ago   COLONOSCOPY WITH PROPOFOL N/A 05/01/2020   Surgeon: Eloise Harman, DO;Nonbleeding internal hemorrhoids, pancolonic diverticulosis, 1 7 mm tubular adenoma resected, single nonbleeding colonic angioectasia treated with APC.  No recommendations to repeat colonoscopy due to age.   EYE SURGERY     laser   fibroadenoma left breast removal  HOT HEMOSTASIS  05/01/2020   Procedure: HOT HEMOSTASIS (ARGON PLASMA COAGULATION/BICAP);  Surgeon: Eloise Harman, DO;  [facility];  Service: Endoscopy;;   OTHER SURGICAL HISTORY     appendectomy   POLYPECTOMY  05/01/2020   Procedure: POLYPECTOMY;  Surgeon: Eloise Harman, DO;  [facility];  Service: Endoscopy;;   TOTAL HIP ARTHROPLASTY Left 06/18/2016   Procedure: LEFT TOTAL HIP ARTHROPLASTY ANTERIOR APPROACH;  Surgeon: Gaynelle Arabian, MD;  Location: [facility];  Service: Orthopedics;  Laterality: Left;   TOTAL HIP ARTHROPLASTY Right 05/05/2018   Procedure: TOTAL HIP ARTHROPLASTY ANTERIOR APPROACH;  Surgeon: Gaynelle Arabian, MD;  Location: [facility];  Service: Orthopedics;  Laterality: Right;   TOTAL KNEE ARTHROPLASTY Right 01/02/2020    Procedure: TOTAL KNEE ARTHROPLASTY;  Surgeon: Gaynelle Arabian, MD;  Location: [facility];  Service: Orthopedics;  Laterality: Right;  85mn   UTERINE FIBROID SURGERY       Allergies  Allergen Reactions   Ace Inhibitors Cough   Caffeine Other (See Comments)    "shoots my BP up"   Nsaids Other (See Comments)    GI upset     Peanut-Containing Drug Products Other (See Comments)    headaches   Ultram [Tramadol Hcl] Other (See Comments)    Headaches. Patient is unsure of this.   Penicillins Itching and Rash    Has patient had a PCN reaction causing immediate rash, facial/tongue/throat swelling, SOB or lightheadedness with hypotension: No Has patient had a PCN reaction causing severe rash involving mucus membranes or skin necrosis: No Has patient had a PCN reaction that required hospitalization No Has patient had a PCN reaction occurring within the last 10 years: No If all of the above answers are "NO", then may proceed with Cephalosporin use.  Tolerated Cephalosporin Date: 01/03/20.        Family History  Problem Relation Age of Onset   Heart disease Mother    Heart attack Mother    Heart failure Mother    Stroke Father    Heart attack Maternal Aunt    Heart disease Maternal Aunt    Heart failure Maternal Aunt    Amblyopia Neg Hx    Blindness Neg Hx    Cataracts Neg Hx    Glaucoma Neg Hx    Macular degeneration Neg Hx    Retinal detachment Neg Hx    Strabismus Neg Hx    Retinitis pigmentosa Neg Hx    Colon cancer Neg Hx      Social History Ms. PBurnoreports that she quit smoking about 56 years ago. Her smoking use included cigarettes. She has a 0.75 pack-year smoking history. She has never used smokeless tobacco. Ms. PCowenreports no history of alcohol use.   Review of Systems CONSTITUTIONAL: No weight loss, fever, chills, weakness or fatigue.  HEENT: Eyes: No visual loss, blurred vision, double vision or yellow sclerae.No hearing loss, sneezing, congestion, runny nose  or sore throat.  SKIN: No rash or itching.  CARDIOVASCULAR:  RESPIRATORY: No shortness of breath, cough or sputum.  GASTROINTESTINAL: No anorexia, nausea, vomiting or diarrhea. No abdominal pain or blood.  GENITOURINARY: No burning on urination, no polyuria NEUROLOGICAL: No headache, dizziness, syncope, paralysis, ataxia, numbness or tingling in the extremities. No change in bowel or bladder control.  MUSCULOSKELETAL: No muscle, back pain, joint pain or stiffness.  LYMPHATICS: No enlarged nodes. No history of splenectomy.  PSYCHIATRIC: No history of depression or anxiety.  ENDOCRINOLOGIC: No reports of sweating, cold or  heat intolerance. No polyuria or polydipsia.  Marland Kitchen   Physical Examination There were no vitals filed for this visit. There were no vitals filed for this visit.  Gen: resting comfortably, no acute distress HEENT: no scleral icterus, pupils equal round and reactive, no palptable cervical adenopathy,  CV Resp: Clear to auscultation bilaterally GI: abdomen is soft, non-tender, non-distended, normal bowel sounds, no hepatosplenomegaly MSK: extremities are warm, no edema.  Skin: warm, no rash Neuro:  no focal deficits Psych: appropriate affect   Diagnostic Studies 04/2016 nuclear stress The study is normal. There are no perfusion defects The left ventricular ejection fraction is hyperdynamic (>65%). This is a low risk study. There was no ST segment deviation noted during stress. LBBB at baseline   05/2019 echo IMPRESSIONS     1. Left ventricular ejection fraction, by estimation, is 55 to 60%. The  left ventricle has normal function. The left ventricle has no regional  wall motion abnormalities. Left ventricular diastolic parameters are  consistent with Grade I diastolic  dysfunction (impaired relaxation). Elevated left ventricular end-diastolic  pressure.   2. Right ventricular systolic function is low normal. The right  ventricular size is normal. Mildly  increased right ventricular wall  thickness. There is moderately elevated pulmonary artery systolic  pressure.   3. Left atrial size was mildly dilated.   4. Small circumferential pericardial effusion without hemodynamic  compromise. The pericardial effusion is circumferential. There is no  evidence of cardiac tamponade.   5. The mitral valve is degenerative. Moderate mitral valve regurgitation.   6. Tricuspid valve regurgitation is mild to moderate.   7. The aortic valve is tricuspid. Aortic valve regurgitation is mild.  Moderate aortic valve stenosis. Aortic valve area, by VTI measures 1.25  cm. Aortic valve mean gradient measures 24.2 mmHg. Aortic valve Vmax  measures 3.21 m/s.   8. The inferior vena cava is normal in size with greater than 50%  respiratory variability, suggesting right atrial pressure of 3 mmHg.     Assessment and Plan        Arnoldo Lenis, M.D., F.A.C.C.

## 2021-01-10 NOTE — Progress Notes (Addendum)
01/14/2021    CHIEF COMPLAINT Patient presents for Retina Evaluation   HISTORY OF PRESENT ILLNESS: Martha Richard is a 87 y.o. female who presents to the clinic today for:   HPI     Retina Evaluation   In both eyes.  This started 22 months ago.  Duration of 22 months.  I, the attending physician,  performed the HPI with the patient and updated documentation appropriately.        Comments   Patient here for Retina evaluation. Referred by Dr Hassell Done. Patient states vision not good. Should have been here sooner but husband sick and couldn't leave him. No eye pain. Uses AT's.       Last edited by Bernarda Caffey, MD on 01/14/2021  1:24 PM.    Pt is delayed to follow from 4 weeks to 22 months due to her husband getting sick, she states she was worried about leaving him alone   Referring physician: Okey Regal, [place],  [place] [id number]  HISTORICAL INFORMATION:   Selected notes from the MEDICAL RECORD NUMBER Referred by Dr. Read Drivers for concern of cystic IRF OS    CURRENT MEDICATIONS: Current Outpatient Medications (Ophthalmic Drugs)  Medication Sig   hydroxypropyl methylcellulose / hypromellose (ISOPTO TEARS / GONIOVISC) 2.5 % ophthalmic solution Place 1 drop into both eyes 3 (three) times daily as needed for dry eyes.   No current facility-administered medications for this visit. (Ophthalmic Drugs)   Current Outpatient Medications (Other)  Medication Sig   hydrochlorothiazide (HYDRODIURIL) 12.5 MG tablet Take 12.5 mg by mouth daily as needed (salt intake).   Current Facility-Administered Medications (Other)  Medication Route   Bevacizumab (AVASTIN) SOLN 1.25 mg Intravitreal   Bevacizumab (AVASTIN) SOLN 1.25 mg Intravitreal   Bevacizumab (AVASTIN) SOLN 1.25 mg Intravitreal   Bevacizumab (AVASTIN) SOLN 1.25 mg Intravitreal   Bevacizumab (AVASTIN) SOLN 1.25 mg Intravitreal   Bevacizumab (AVASTIN) SOLN 1.25 mg Intravitreal   REVIEW OF SYSTEMS: ROS    Positive for: Musculoskeletal, Cardiovascular, Eyes Negative for: Constitutional, Gastrointestinal, Neurological, Skin, Genitourinary, HENT, Endocrine, Respiratory, Psychiatric, Allergic/Imm, Heme/Lymph Last edited by Theodore Demark, COA on 01/14/2021  9:37 AM.     ALLERGIES Allergies  Allergen Reactions   Ace Inhibitors Cough   Caffeine Other (See Comments)    "shoots my BP up"   Nsaids Other (See Comments)    GI upset     Peanut-Containing Drug Products Other (See Comments)    headaches   Ultram [Tramadol Hcl] Other (See Comments)    Headaches. Patient is unsure of this.   Penicillins Itching and Rash    Has patient had a PCN reaction causing immediate rash, facial/tongue/throat swelling, SOB or lightheadedness with hypotension: No Has patient had a PCN reaction causing severe rash involving mucus membranes or skin necrosis: No Has patient had a PCN reaction that required hospitalization No Has patient had a PCN reaction occurring within the last 10 years: No If all of the above answers are "NO", then may proceed with Cephalosporin use.  Tolerated Cephalosporin Date: 01/03/20.     PAST MEDICAL HISTORY Past Medical History:  Diagnosis Date   Aortic stenosis    Arrhythmia    palpitations   Arthritis    Cancer (HCC)    squamous cell Left hand   Complication of anesthesia    slow to wake up   Depression    Lost a son to cancer   Headache    hx of migraines  Hypertension    Hypertensive retinopathy    OU   Left bundle branch block    Past Surgical History:  Procedure Laterality Date   APPENDECTOMY     CATARACT EXTRACTION Bilateral    OU about 10 yrs ago   COLONOSCOPY WITH PROPOFOL N/A 05/01/2020   Surgeon: Eloise Harman, DO;Nonbleeding internal hemorrhoids, pancolonic diverticulosis, 1 7 mm tubular adenoma resected, single nonbleeding colonic angioectasia treated with APC.  No recommendations to repeat colonoscopy due to age.   EYE SURGERY     laser    fibroadenoma left breast removal     HOT HEMOSTASIS  05/01/2020   Procedure: HOT HEMOSTASIS (ARGON PLASMA COAGULATION/BICAP);  Surgeon: Eloise Harman, DO;  Location: [facility];  Service: Endoscopy;;   OTHER SURGICAL HISTORY     appendectomy   POLYPECTOMY  05/01/2020   Procedure: POLYPECTOMY;  Surgeon: Eloise Harman, DO;  Location: [facility];  Service: Endoscopy;;   TOTAL HIP ARTHROPLASTY Left 06/18/2016   Procedure: LEFT TOTAL HIP ARTHROPLASTY ANTERIOR APPROACH;  Surgeon: Gaynelle Arabian, MD;  Location: [facility];  Service: Orthopedics;  Laterality: Left;   TOTAL HIP ARTHROPLASTY Right 05/05/2018   Procedure: TOTAL HIP ARTHROPLASTY ANTERIOR APPROACH;  Surgeon: Gaynelle Arabian, MD;  Location: [facility];  Service: Orthopedics;  Laterality: Right;   TOTAL KNEE ARTHROPLASTY Right 01/02/2020   Procedure: TOTAL KNEE ARTHROPLASTY;  Surgeon: Gaynelle Arabian, MD;  Location: [facility];  Service: Orthopedics;  Laterality: Right;  45min   UTERINE FIBROID SURGERY     FAMILY HISTORY Family History  Problem Relation Age of Onset   Heart disease Mother    Heart attack Mother    Heart failure Mother    Stroke Father    Heart attack Maternal Aunt    Heart disease Maternal Aunt    Heart failure Maternal Aunt    Amblyopia Neg Hx    Blindness Neg Hx    Cataracts Neg Hx    Glaucoma Neg Hx    Macular degeneration Neg Hx    Retinal detachment Neg Hx    Strabismus Neg Hx    Retinitis pigmentosa Neg Hx    Colon cancer Neg Hx     SOCIAL HISTORY Social History   Tobacco Use   Smoking status: Former    Packs/day: 0.25    Years: 3.00    Pack years: 0.75    Types: Cigarettes    Quit date: 04/27/1964    Years since quitting: 56.7   Smokeless tobacco: Never   Tobacco comments:    years ago 50 plus years  Vaping Use   Vaping Use: Never used  Substance Use Topics   Alcohol use: No   Drug use: No       OPHTHALMIC EXAM: Base Eye Exam     Visual Acuity (Snellen - Linear)       Right Left    Dist cc 20/20 -1 20/30 -2   Dist ph cc  NI    Correction: Glasses         Tonometry (Tonopen, 9:33 AM)       Right Left   Pressure 18 19         Pupils       Dark Light Shape React APD   Right 3 2 Round Brisk None   Left 3 2 Round Brisk None         Visual Fields (Counting fingers)       Left Right    Full Full  Extraocular Movement       Right Left    Full, Ortho Full, Ortho         Neuro/Psych     Oriented x3: Yes   Mood/Affect: Normal         Dilation     Both eyes: 1.0% Mydriacyl, 2.5% Phenylephrine @ 9:33 AM           Slit Lamp and Fundus Exam     Slit Lamp Exam       Right Left   Lids/Lashes Dermatochalasis - upper lid, Meibomian gland dysfunction, Scurf Dermatochalasis - upper lid, Meibomian gland dysfunction   Conjunctiva/Sclera White and quiet White and quiet   Cornea Arcus, 2+ Punctate epithelial erosions, Debris in tear film, Well healed cataract wounds, nasal and temporal LRI Mild arcus, 2+ Punctate epithelial erosions, nasal and temporal LRI, Debris in tear film   Anterior Chamber Deep and quiet Deep and quiet   Iris Round and moderately dilated Round and moderately dilated    Lens PC IOL in good position with open PC PC IOL in good position, open PC   Anterior Vitreous Vitreous syneresis Vitreous syneresis, Posterior vitreous detachment         Fundus Exam       Right Left   Disc Disc heme and focal edema at 0430, Pink and Sharp Tilted, +collateral vessels temporally, vascular loops, inferior rim thinning, temporal pallor, Sharp rim, +cupping   C/D Ratio 0.5 0.7   Macula Blunted foveal reflex, Retinal pigment epithelial mottling, trace cystic changes - improved, No heme or edema good foveal reflex; central IRF--pesistent and fine exudate superior macula, trace ERM   Vessels Inferior vessels 3+tortuosity, Vascular attenuation, Impending BRVO inferiorly, dilated venuels (inferior>superior), AV crossing changes, Copper  wiring Vascular attenuation, Tortuous, AV crossing changes, dilated venules, Copper wiring   Periphery Attached, scattered MAs Attached, scattered MAs           Refraction     Wearing Rx       Sphere Cylinder Axis Add   Right +1.50 +1.50 014 +2.50   Left +2.00 +1.00 178 +2.50            IMAGING AND PROCEDURES  Imaging and Procedures for 05/25/17  OCT, Retina - OU - Both Eyes       Right Eye Quality was borderline. Central Foveal Thickness: 271. Progression has been stable. Findings include normal foveal contour, no SRF, epiretinal membrane, intraretinal hyper-reflective material, no IRF (stable improvement in IRF ).   Left Eye Quality was good. Central Foveal Thickness: 283. Progression has worsened. Findings include epiretinal membrane, no SRF, intraretinal fluid, intraretinal hyper-reflective material, abnormal foveal contour (Interval increase in IRF temporal fovea and macula).   Notes *Images captured and stored on drive  Diagnosis / Impression:  OD: stable improvement in IRF  OS: BRVO w/ CME OS -- Interval increase in IRF temporal fovea and macula  Clinical management:  See below  Abbreviations: NFP - Normal foveal profile. CME - cystoid macular edema. PED - pigment epithelial detachment. IRF - intraretinal fluid. SRF - subretinal fluid. EZ - ellipsoid zone. ERM - epiretinal membrane. ORA - outer retinal atrophy. ORT - outer retinal tubulation. SRHM - subretinal hyper-reflective material       Intravitreal Injection, Pharmacologic Agent - OS - Left Eye       Time Out 01/14/2021. 10:20 AM. Confirmed correct patient, procedure, site, and patient consented.   Anesthesia Topical anesthesia was used.   Procedure A (32g) needle  was used.   Injection: 1.25 mg Bevacizumab 1.25mg /0.5ml   Route: Intravitreal, Site: Left Eye   NDC: H061816, Lot: [id number], Expiration date: 02/27/2021, Waste: 0.05 mL   Post-op Post injection exam found visual acuity of  at least counting fingers. The patient tolerated the procedure well. There were no complications. The patient received written and verbal post procedure care education.            ASSESSMENT/PLAN:    ICD-10-CM   1. Branch retinal vein occlusion of left eye with macular edema  H34.8320 Intravitreal Injection, Pharmacologic Agent - OS - Left Eye    Bevacizumab (AVASTIN) SOLN 1.25 mg    2. Retinal edema  H35.81 OCT, Retina - OU - Both Eyes    3. Essential hypertension  I10     4. Hypertensive retinopathy of both eyes  H35.033     5. Posterior vitreous detachment of left eye  H43.812     6. Glaucoma suspect of left eye  H40.002     7. Pseudophakia of both eyes  Z96.1     8. PCO (posterior capsular opacification), left  H26.492      1,2. BRVO w/ macular edema OS  - delayed follow up from 4-5 weeks - 22 months (from Jan 2021 to Nov 2022)  - on presenting exam, superior temporal venule was dilated and tortuous but no significant intraretinal hemorrhages suggesting perhaps a remote BRVO event had occurred  - FA at presentation (01.23.19) shows significant leakage from branches from the superior temporal venules corresponding to central cystic changes noted on OCT  - S/P IVA OS #1 (01.23.19), #2 (02.25.19), #3 (04.01.19), #4 (05.07.19), #5 (06.25.19), #6 (02.21.20), #7 (09.16.20), #8 (10.21.20), #9 (12.08.20), #10 (01.22.21)  - lost to follow up from February to Sept 2020 and from January 2021 to November 2022  - OCT today shows Interval increase in IRF temporal fovea and macula  - BCVA OS relatively stable at 20/30  - recommend IVA OS #11 today (11.21.22)  - RBA of procedure discussed, questions answered  - informed consent for IVA OS obtained and signed today, 11.21.22  - see procedure note  - F/U 6 weeks -- DFE/OCT/possible injection  3,4. Hypertensive retinopathy OU  - discussed importance of tight BP control  - monitor  5. PVD OS  - Discussed findings and prognosis  - No  RT or RD on 360 peripheral exam  - Reviewed s/s of RT/RD  - Strict return precautions for any such RT/RD symptoms  6. Glaucoma Suspect OS  - under the expert management of Dr. Kathlen Mody  7,8. Pseudophakia OU  - s/p CE/IOL OU [facility] yrs ago at [facility]  - s/p YAG capsulotomy OU  - OS re-YAG'd by Dr. Kathlen Mody -- PC opening improved  - monitor   Ophthalmic Meds Ordered this visit:  Meds ordered this encounter  Medications   Bevacizumab (AVASTIN) SOLN 1.25 mg       Return in about 6 weeks (around 02/25/2021) for f/u BRVO OS, DFE, OCT.  There are no Patient Instructions on file for this visit.   Explained the diagnoses, plan, and follow up with the patient and they expressed understanding.  Patient expressed understanding of the importance of proper follow up care.   This document serves as a record of services personally performed by Gardiner Sleeper, MD, PhD. It was created on their behalf by Roselee Nova, COMT. The creation of this record is the provider's dictation and/or activities during the visit.  Electronically signed by: Roselee Nova, COMT 01/14/21 1:28 PM  This document serves as a record of services personally performed by Gardiner Sleeper, MD, PhD. It was created on their behalf by San Jetty. Owens Shark, OA an ophthalmic technician. The creation of this record is the provider's dictation and/or activities during the visit.    Electronically signed by: San Jetty. Owens Shark, [place] 11.21.2022 1:28 PM  Gardiner Sleeper, M.D., Ph.D. Diseases & Surgery of the Retina and Vitreous [facility]  I have reviewed the above documentation for accuracy and completeness, and I agree with the above. Gardiner Sleeper, M.D., Ph.D. 01/14/21 1:28 PM     Abbreviations: M myopia (nearsighted); A astigmatism; H hyperopia (farsighted); P presbyopia; Mrx spectacle prescription;  CTL contact lenses; OD right eye; OS left eye; OU both eyes  XT exotropia; ET esotropia; PEK punctate  epithelial keratitis; PEE punctate epithelial erosions; DES dry eye syndrome; MGD meibomian gland dysfunction; ATs artificial tears; PFAT's preservative free artificial tears; [place] nuclear sclerotic cataract; PSC posterior subcapsular cataract; ERM epi-retinal membrane; PVD posterior vitreous detachment; RD retinal detachment; DM diabetes mellitus; DR diabetic retinopathy; NPDR non-proliferative diabetic retinopathy; PDR proliferative diabetic retinopathy; CSME clinically significant macular edema; DME diabetic macular edema; dbh dot blot hemorrhages; CWS cotton wool spot; POAG primary open angle glaucoma; C/D cup-to-disc ratio; HVF humphrey visual field; GVF goldmann visual field; OCT optical coherence tomography; IOP intraocular pressure; BRVO Branch retinal vein occlusion; CRVO central retinal vein occlusion; CRAO central retinal artery occlusion; BRAO branch retinal artery occlusion; RT retinal tear; SB scleral buckle; PPV pars plana vitrectomy; VH Vitreous hemorrhage; PRP panretinal laser photocoagulation; IVK intravitreal kenalog; VMT vitreomacular traction; MH Macular hole;  NVD neovascularization of the disc; NVE neovascularization elsewhere; AREDS age related eye disease study; ARMD age related macular degeneration; POAG primary open angle glaucoma; EBMD epithelial/anterior basement membrane dystrophy; ACIOL anterior chamber intraocular lens; IOL intraocular lens; PCIOL posterior chamber intraocular lens; Phaco/IOL phacoemulsification with intraocular lens placement; [place] photorefractive keratectomy; LASIK laser assisted in situ keratomileusis; HTN hypertension; DM diabetes mellitus; COPD chronic obstructive pulmonary disease

## 2021-01-14 ENCOUNTER — Ambulatory Visit (INDEPENDENT_AMBULATORY_CARE_PROVIDER_SITE_OTHER): Payer: Medicare Other | Admitting: Ophthalmology

## 2021-01-14 ENCOUNTER — Other Ambulatory Visit: Payer: Self-pay

## 2021-01-14 ENCOUNTER — Encounter (INDEPENDENT_AMBULATORY_CARE_PROVIDER_SITE_OTHER): Payer: Self-pay | Admitting: Ophthalmology

## 2021-01-14 DIAGNOSIS — H35033 Hypertensive retinopathy, bilateral: Secondary | ICD-10-CM | POA: Diagnosis not present

## 2021-01-14 DIAGNOSIS — H3581 Retinal edema: Secondary | ICD-10-CM

## 2021-01-14 DIAGNOSIS — H40002 Preglaucoma, unspecified, left eye: Secondary | ICD-10-CM

## 2021-01-14 DIAGNOSIS — H34832 Tributary (branch) retinal vein occlusion, left eye, with macular edema: Secondary | ICD-10-CM | POA: Diagnosis not present

## 2021-01-14 DIAGNOSIS — I10 Essential (primary) hypertension: Secondary | ICD-10-CM

## 2021-01-14 DIAGNOSIS — H43812 Vitreous degeneration, left eye: Secondary | ICD-10-CM | POA: Diagnosis not present

## 2021-01-14 DIAGNOSIS — H26492 Other secondary cataract, left eye: Secondary | ICD-10-CM | POA: Diagnosis not present

## 2021-01-14 DIAGNOSIS — Z961 Presence of intraocular lens: Secondary | ICD-10-CM

## 2021-01-14 MED ORDER — BEVACIZUMAB CHEMO INJECTION 1.25MG/0.05ML SYRINGE FOR KALEIDOSCOPE
1.2500 mg | INTRAVITREAL | Status: AC | PRN
Start: 2021-01-14 — End: 2021-01-14
  Administered 2021-01-14: 1.25 mg via INTRAVITREAL

## 2021-02-21 NOTE — Progress Notes (Shared)
01/14/2021    CHIEF COMPLAINT Patient presents for No chief complaint on file.    HISTORY OF PRESENT ILLNESS: Martha Richard is a 87 y.o. female who presents to the clinic today for:      Referring physician: Manon Hilding, MD [place],  [place] [id number]  HISTORICAL INFORMATION:   Selected notes from the MEDICAL RECORD NUMBER Referred by Dr. Read Drivers for concern of cystic IRF OS    CURRENT MEDICATIONS: Current Outpatient Medications (Ophthalmic Drugs)  Medication Sig   hydroxypropyl methylcellulose / hypromellose (ISOPTO TEARS / GONIOVISC) 2.5 % ophthalmic solution Place 1 drop into both eyes 3 (three) times daily as needed for dry eyes.   No current facility-administered medications for this visit. (Ophthalmic Drugs)   Current Outpatient Medications (Other)  Medication Sig   hydrochlorothiazide (HYDRODIURIL) 12.5 MG tablet Take 12.5 mg by mouth daily as needed (salt intake).   Current Facility-Administered Medications (Other)  Medication Route   Bevacizumab (AVASTIN) SOLN 1.25 mg Intravitreal   Bevacizumab (AVASTIN) SOLN 1.25 mg Intravitreal   Bevacizumab (AVASTIN) SOLN 1.25 mg Intravitreal   Bevacizumab (AVASTIN) SOLN 1.25 mg Intravitreal   Bevacizumab (AVASTIN) SOLN 1.25 mg Intravitreal   Bevacizumab (AVASTIN) SOLN 1.25 mg Intravitreal   REVIEW OF SYSTEMS:   ALLERGIES Allergies  Allergen Reactions   Ace Inhibitors Cough   Caffeine Other (See Comments)    "shoots my BP up"   Nsaids Other (See Comments)    GI upset     Peanut-Containing Drug Products Other (See Comments)    headaches   Ultram [Tramadol Hcl] Other (See Comments)    Headaches. Patient is unsure of this.   Penicillins Itching and Rash    Has patient had a PCN reaction causing immediate rash, facial/tongue/throat swelling, SOB or lightheadedness with hypotension: No Has patient had a PCN reaction causing severe rash involving mucus membranes or skin necrosis: No Has patient had a PCN  reaction that required hospitalization No Has patient had a PCN reaction occurring within the last 10 years: No If all of the above answers are "NO", then may proceed with Cephalosporin use.  Tolerated Cephalosporin Date: 01/03/20.     PAST MEDICAL HISTORY Past Medical History:  Diagnosis Date   Aortic stenosis    Arrhythmia    palpitations   Arthritis    Cancer (HCC)    squamous cell Left hand   Complication of anesthesia    slow to wake up   Depression    Lost a son to cancer   Headache    hx of migraines   Hypertension    Hypertensive retinopathy    OU   Left bundle branch block    Past Surgical History:  Procedure Laterality Date   APPENDECTOMY     CATARACT EXTRACTION Bilateral    OU about 10 yrs ago   COLONOSCOPY WITH PROPOFOL N/A 05/01/2020   Surgeon: Eloise Harman, DO;Nonbleeding internal hemorrhoids, pancolonic diverticulosis, 1 7 mm tubular adenoma resected, single nonbleeding colonic angioectasia treated with APC.  No recommendations to repeat colonoscopy due to age.   EYE SURGERY     laser   fibroadenoma left breast removal     HOT HEMOSTASIS  05/01/2020   Procedure: HOT HEMOSTASIS (ARGON PLASMA COAGULATION/BICAP);  Surgeon: Eloise Harman, DO;  [facility];  Service: Endoscopy;;   OTHER SURGICAL HISTORY     appendectomy   POLYPECTOMY  05/01/2020   Procedure: POLYPECTOMY;  Surgeon: Eloise Harman, DO;  [facility];  Service: Endoscopy;;   TOTAL HIP ARTHROPLASTY Left 06/18/2016   Procedure: LEFT TOTAL HIP ARTHROPLASTY ANTERIOR APPROACH;  Surgeon: Gaynelle Arabian, MD;  Location: [facility];  Service: Orthopedics;  Laterality: Left;   TOTAL HIP ARTHROPLASTY Right 05/05/2018   Procedure: TOTAL HIP ARTHROPLASTY ANTERIOR APPROACH;  Surgeon: Gaynelle Arabian, MD;  Location: [facility];  Service: Orthopedics;  Laterality: Right;   TOTAL KNEE ARTHROPLASTY Right 01/02/2020   Procedure: TOTAL KNEE ARTHROPLASTY;  Surgeon: Gaynelle Arabian, MD;   Location: [facility];  Service: Orthopedics;  Laterality: Right;  58min   UTERINE FIBROID SURGERY     FAMILY HISTORY Family History  Problem Relation Age of Onset   Heart disease Mother    Heart attack Mother    Heart failure Mother    Stroke Father    Heart attack Maternal Aunt    Heart disease Maternal Aunt    Heart failure Maternal Aunt    Amblyopia Neg Hx    Blindness Neg Hx    Cataracts Neg Hx    Glaucoma Neg Hx    Macular degeneration Neg Hx    Retinal detachment Neg Hx    Strabismus Neg Hx    Retinitis pigmentosa Neg Hx    Colon cancer Neg Hx     SOCIAL HISTORY Social History   Tobacco Use   Smoking status: Former    Packs/day: 0.25    Years: 3.00    Pack years: 0.75    Types: Cigarettes    Quit date: 04/27/1964    Years since quitting: 56.8   Smokeless tobacco: Never   Tobacco comments:    years ago 69 plus years  Vaping Use   Vaping Use: Never used  Substance Use Topics   Alcohol use: No   Drug use: No       OPHTHALMIC EXAM: Not recorded     IMAGING AND PROCEDURES  Imaging and Procedures for 05/25/17          ASSESSMENT/PLAN:  No diagnosis found.  1,2. BRVO w/ macular edema OS  - delayed follow up from 4-5 weeks - 22 months (from Jan 2021 to Nov 2022)  - on presenting exam, superior temporal venule was dilated and tortuous but no significant intraretinal hemorrhages suggesting perhaps a remote BRVO event had occurred  - FA at presentation (01.23.19) shows significant leakage from branches from the superior temporal venules corresponding to central cystic changes noted on OCT  - S/P IVA OS #1 (01.23.19), #2 (02.25.19), #3 (04.01.19), #4 (05.07.19), #5 (06.25.19), #6 (02.21.20), #7 (09.16.20), #8 (10.21.20), #9 (12.08.20), #10 (01.22.21), #11 (11.21.22)  - lost to follow up from February to Sept 2020 and from January 2021 to November 2022  - OCT today shows Interval increase in IRF temporal fovea and macula  - BCVA OS relatively stable at  20/30  - recommend IVA OS #12 today (01.04.23)  - RBA of procedure discussed, questions answered  - informed consent for IVA OS obtained and signed today, 11.21.22  - see procedure note  - F/U 6 weeks -- DFE/OCT/possible injection  3,4. Hypertensive retinopathy OU  - discussed importance of tight BP control  - monitor  5. PVD OS  - Discussed findings and prognosis  - No RT or RD on 360 peripheral exam  - Reviewed s/s of RT/RD  - Strict return precautions for any such RT/RD symptoms  6. Glaucoma Suspect OS  - under the expert management of Dr. Kathlen Mody  7,8. Pseudophakia OU  -  s/p CE/IOL OU 11 yrs ago at [facility]  - s/p YAG capsulotomy OU  - OS re-YAG'd by Dr. Kathlen Mody -- PC opening improved  - monitor   Ophthalmic Meds Ordered this visit:  No orders of the defined types were placed in this encounter.      No follow-ups on file.  There are no Patient Instructions on file for this visit.   Explained the diagnoses, plan, and follow up with the patient and they expressed understanding.  Patient expressed understanding of the importance of proper follow up care.   This document serves as a record of services personally performed by Gardiner Sleeper, MD, PhD. It was created on their behalf by Orvan Falconer, an ophthalmic technician. The creation of this record is the provider's dictation and/or activities during the visit.    Electronically signed by: Orvan Falconer, OA, 02/21/21  11:23 AM   Gardiner Sleeper, M.D., Ph.D. Diseases & Surgery of the Retina and Vitreous [facility]  I have reviewed the above documentation for accuracy and completeness, and I agree with the above. Gardiner Sleeper, M.D., Ph.D. 01/14/21 11:23 AM     Abbreviations: M myopia (nearsighted); A astigmatism; H hyperopia (farsighted); P presbyopia; Mrx spectacle prescription;  CTL contact lenses; OD right eye; OS left eye; OU both eyes  XT exotropia; ET esotropia; PEK  punctate epithelial keratitis; PEE punctate epithelial erosions; DES dry eye syndrome; MGD meibomian gland dysfunction; ATs artificial tears; PFAT's preservative free artificial tears; [place] nuclear sclerotic cataract; PSC posterior subcapsular cataract; ERM epi-retinal membrane; PVD posterior vitreous detachment; RD retinal detachment; DM diabetes mellitus; DR diabetic retinopathy; NPDR non-proliferative diabetic retinopathy; PDR proliferative diabetic retinopathy; CSME clinically significant macular edema; DME diabetic macular edema; dbh dot blot hemorrhages; CWS cotton wool spot; POAG primary open angle glaucoma; C/D cup-to-disc ratio; HVF humphrey visual field; GVF goldmann visual field; OCT optical coherence tomography; IOP intraocular pressure; BRVO Branch retinal vein occlusion; CRVO central retinal vein occlusion; CRAO central retinal artery occlusion; BRAO branch retinal artery occlusion; RT retinal tear; SB scleral buckle; PPV pars plana vitrectomy; VH Vitreous hemorrhage; PRP panretinal laser photocoagulation; IVK intravitreal kenalog; VMT vitreomacular traction; MH Macular hole;  NVD neovascularization of the disc; NVE neovascularization elsewhere; AREDS age related eye disease study; ARMD age related macular degeneration; POAG primary open angle glaucoma; EBMD epithelial/anterior basement membrane dystrophy; ACIOL anterior chamber intraocular lens; IOL intraocular lens; PCIOL posterior chamber intraocular lens; Phaco/IOL phacoemulsification with intraocular lens placement; [place] photorefractive keratectomy; LASIK laser assisted in situ keratomileusis; HTN hypertension; DM diabetes mellitus; COPD chronic obstructive pulmonary disease

## 2021-02-27 ENCOUNTER — Encounter (INDEPENDENT_AMBULATORY_CARE_PROVIDER_SITE_OTHER): Payer: Medicare Other | Admitting: Ophthalmology

## 2021-03-14 NOTE — Progress Notes (Signed)
01/14/2021    CHIEF COMPLAINT Patient presents for Retina Follow Up   HISTORY OF PRESENT ILLNESS: Martha Richard is a 88 y.o. female who presents to the clinic today for:   HPI     Retina Follow Up   Patient presents with  CRVO/BRVO.  In left eye.  Severity is moderate.  Since onset it is stable.  I, the attending physician,  performed the HPI with the patient and updated documentation appropriately.        Comments   Pt states she got new glasses 2-3 months ago and does not feel like her eyes are focusing together in them, she sees occasional floaters, she uses AT's PRN      Last edited by Bernarda Caffey, MD on 03/18/2021  1:53 PM.    Pt states she doesn't feel like her new glasses are working for her  Referring physician: Manon Hilding, MD [place],  [place] [address]  HISTORICAL INFORMATION:   Selected notes from the [facility] Referred by Dr. Read Drivers for concern of cystic IRF OS    CURRENT MEDICATIONS: Current Outpatient Medications (Ophthalmic Drugs)  Medication Sig   hydroxypropyl methylcellulose / hypromellose (ISOPTO TEARS / GONIOVISC) 2.5 % ophthalmic solution Place 1 drop into both eyes 3 (three) times daily as needed for dry eyes.   No current facility-administered medications for this visit. (Ophthalmic Drugs)   Current Outpatient Medications (Other)  Medication Sig   hydrochlorothiazide (HYDRODIURIL) 12.5 MG tablet Take 12.5 mg by mouth daily as needed (salt intake).   Current Facility-Administered Medications (Other)  Medication Route   Bevacizumab (AVASTIN) SOLN 1.25 mg Intravitreal   Bevacizumab (AVASTIN) SOLN 1.25 mg Intravitreal   Bevacizumab (AVASTIN) SOLN 1.25 mg Intravitreal   Bevacizumab (AVASTIN) SOLN 1.25 mg Intravitreal   Bevacizumab (AVASTIN) SOLN 1.25 mg Intravitreal   Bevacizumab (AVASTIN) SOLN 1.25 mg Intravitreal   REVIEW OF SYSTEMS: ROS   Positive for: Cardiovascular, Eyes Negative for: Constitutional,  Gastrointestinal, Neurological, Skin, Genitourinary, Musculoskeletal, HENT, Endocrine, Respiratory, Psychiatric, Allergic/Imm, Heme/Lymph Last edited by Debbrah Alar, COT on 03/18/2021  1:12 PM.      ALLERGIES Allergies  Allergen Reactions   Ace Inhibitors Cough   Caffeine Other (See Comments)    "shoots my BP up"   Nsaids Other (See Comments)    GI upset     Peanut-Containing Drug Products Other (See Comments)    headaches   Ultram [Tramadol Hcl] Other (See Comments)    Headaches. Patient is unsure of this.   Penicillins Itching and Rash    Has patient had a PCN reaction causing immediate rash, facial/tongue/throat swelling, SOB or lightheadedness with hypotension: No Has patient had a PCN reaction causing severe rash involving mucus membranes or skin necrosis: No Has patient had a PCN reaction that required hospitalization No Has patient had a PCN reaction occurring within the last 10 years: No If all of the above answers are "NO", then may proceed with Cephalosporin use.  Tolerated Cephalosporin Date: 01/03/20.     PAST MEDICAL HISTORY Past Medical History:  Diagnosis Date   Aortic stenosis    Arrhythmia    palpitations   Arthritis    Cancer (HCC)    squamous cell Left hand   Complication of anesthesia    slow to wake up   Depression    Lost a son to cancer   Headache    hx of migraines   Hypertension    Hypertensive retinopathy  OU   Left bundle branch block    Past Surgical History:  Procedure Laterality Date   APPENDECTOMY     CATARACT EXTRACTION Bilateral    OU about 10 yrs ago   COLONOSCOPY WITH PROPOFOL N/A 05/01/2020   Surgeon: Eloise Harman, DO;Nonbleeding internal hemorrhoids, pancolonic diverticulosis, 1 7 mm tubular adenoma resected, single nonbleeding colonic angioectasia treated with APC.  No recommendations to repeat colonoscopy due to age.   EYE SURGERY     laser   fibroadenoma left breast removal     HOT HEMOSTASIS  05/01/2020    Procedure: HOT HEMOSTASIS (ARGON PLASMA COAGULATION/BICAP);  Surgeon: Eloise Harman, DO;  [facility];  Service: Endoscopy;;   OTHER SURGICAL HISTORY     appendectomy   POLYPECTOMY  05/01/2020   Procedure: POLYPECTOMY;  Surgeon: Eloise Harman, DO;  [facility];  Service: Endoscopy;;   TOTAL HIP ARTHROPLASTY Left 06/18/2016   Procedure: LEFT TOTAL HIP ARTHROPLASTY ANTERIOR APPROACH;  Surgeon: Gaynelle Arabian, MD;  [facility];  Service: Orthopedics;  Laterality: Left;   TOTAL HIP ARTHROPLASTY Right 05/05/2018   Procedure: TOTAL HIP ARTHROPLASTY ANTERIOR APPROACH;  Surgeon: Gaynelle Arabian, MD;  [facility];  Service: Orthopedics;  Laterality: Right;   TOTAL KNEE ARTHROPLASTY Right 01/02/2020   Procedure: TOTAL KNEE ARTHROPLASTY;  Surgeon: Gaynelle Arabian, MD;  [facility];  Service: Orthopedics;  Laterality: Right;  81min   UTERINE FIBROID SURGERY     FAMILY HISTORY Family History  Problem Relation Age of Onset   Heart disease Mother    Heart attack Mother    Heart failure Mother    Stroke Father    Heart attack Maternal Aunt    Heart disease Maternal Aunt    Heart failure Maternal Aunt    Amblyopia Neg Hx    Blindness Neg Hx    Cataracts Neg Hx    Glaucoma Neg Hx    Macular degeneration Neg Hx    Retinal detachment Neg Hx    Strabismus Neg Hx    Retinitis pigmentosa Neg Hx    Colon cancer Neg Hx     SOCIAL HISTORY Social History   Tobacco Use   Smoking status: Former    Packs/day: 0.25    Years: 3.00    Pack years: 0.75    Types: Cigarettes    Quit date: 04/27/1964    Years since quitting: 56.9   Smokeless tobacco: Never   Tobacco comments:    years ago 50 plus years  Vaping Use   Vaping Use: Never used  Substance Use Topics   Alcohol use: No   Drug use: No       OPHTHALMIC EXAM: Base Eye Exam     Visual Acuity (Snellen - Linear)       Right Left   Dist cc 20/20 -1 20/40 -1   Dist ph cc  20/30    Correction:  Glasses         Tonometry (Tonopen, 1:18 PM)       Right Left   Pressure 12 17         Pupils       Dark Light Shape React APD   Right 2 1.5 Round Minimal None   Left 2 1.5 Round Minimal None         Visual Fields (Counting fingers)       Left Right    Full Full         Extraocular  Movement       Right Left    Full, Ortho Full, Ortho         Neuro/Psych     Oriented x3: Yes   Mood/Affect: Normal         Dilation     Both eyes: 1.0% Mydriacyl, 2.5% Phenylephrine @ 1:18 PM           Slit Lamp and Fundus Exam     Slit Lamp Exam       Right Left   Lids/Lashes Dermatochalasis - upper lid, Meibomian gland dysfunction, Scurf Dermatochalasis - upper lid, Meibomian gland dysfunction   Conjunctiva/Sclera White and quiet White and quiet   Cornea Arcus, 2+ Punctate epithelial erosions, Debris in tear film, Well healed cataract wounds, nasal and temporal LRI Mild arcus, 2+ Punctate epithelial erosions, nasal and temporal LRI, Debris in tear film   Anterior Chamber Deep and quiet Deep and quiet   Iris Round and moderately dilated Round and moderately dilated    Lens PC IOL in good position with open PC PC IOL in good position, open PC   Anterior Vitreous Vitreous syneresis Vitreous syneresis, Posterior vitreous detachment         Fundus Exam       Right Left   Disc Disc heme and focal edema at 0430, Pink and Sharp Tilted, +collateral vessels temporally, vascular loops, inferior rim thinning, temporal pallor, Sharp rim, +cupping   C/D Ratio 0.5 0.7   Macula Blunted foveal reflex, Retinal pigment epithelial mottling, trace cystic changes - stably improved, No heme or edema good foveal reflex; central cystic changes - improved, fine exudate superior macula, trace ERM   Vessels Inferior vessels 3+tortuosity, Vascular attenuation, Impending BRVO inferiorly, dilated venuels (inferior>superior), AV crossing changes, Copper wiring Vascular attenuation, Tortuous,  AV crossing changes, dilated venules, Copper wiring   Periphery Attached, scattered MAs Attached, scattered MAs           IMAGING AND PROCEDURES  Imaging and Procedures for 05/25/17  OCT, Retina - OU - Both Eyes       Right Eye Quality was borderline. Central Foveal Thickness: 274. Progression has been stable. Findings include normal foveal contour, no SRF, epiretinal membrane, intraretinal hyper-reflective material, no IRF (stable improvement in IRF ).   Left Eye Quality was good. Central Foveal Thickness: 263. Progression has improved. Findings include epiretinal membrane, no SRF, intraretinal hyper-reflective material, normal foveal contour, no IRF (Interval improvement in IRF/cystic changes temporal fovea ).   Notes *Images captured and stored on drive  Diagnosis / Impression:  OD: stable improvement in IRF  OS: BRVO w/ CME OS -- Interval improvement in IRF/cystic changes temporal fovea   Clinical management:  See below  Abbreviations: NFP - Normal foveal profile. CME - cystoid macular edema. PED - pigment epithelial detachment. IRF - intraretinal fluid. SRF - subretinal fluid. EZ - ellipsoid zone. ERM - epiretinal membrane. ORA - outer retinal atrophy. ORT - outer retinal tubulation. SRHM - subretinal hyper-reflective material       Intravitreal Injection, Pharmacologic Agent - OS - Left Eye       Time Out 03/18/2021. 1:44 PM. Confirmed correct patient, procedure, site, and patient consented.   Anesthesia Topical anesthesia was used. Anesthetic medications included Lidocaine 2%, Proparacaine 0.5%.   Procedure Preparation included 5% betadine to ocular surface, eyelid speculum. A (32g) needle was used.   Injection: 1.25 mg Bevacizumab 1.25mg /0.4ml   Route: Intravitreal, Site: Left Eye   NDC: H061816, Lot: [id number], Expiration date:  04/04/2021, Waste: 0.05 mL   Post-op Post injection exam found visual acuity of at least counting fingers. The patient  tolerated the procedure well. There were no complications. The patient received written and verbal post procedure care education. Post injection medications were not given.            ASSESSMENT/PLAN:    ICD-10-CM   1. Branch retinal vein occlusion of left eye with macular edema  H34.8320 OCT, Retina - OU - Both Eyes    Intravitreal Injection, Pharmacologic Agent - OS - Left Eye    Bevacizumab (AVASTIN) SOLN 1.25 mg    2. Essential hypertension  I10     3. Hypertensive retinopathy of both eyes  H35.033     4. Posterior vitreous detachment of left eye  H43.812     5. 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 Pseudophakia of both eyes  Z96.1     7. PCO (posterior capsular opacification), left  H26.492       1. BRVO w/ macular edema OS  - delayed follow up from 4-5 weeks to 22 months (from Jan 2021 to Nov 2022)  - on presenting exam, superior temporal venule was dilated and tortuous but no significant intraretinal hemorrhages suggesting perhaps a remote BRVO event had occurred  - FA at presentation (01.23.19) shows significant leakage from branches from the superior temporal venules corresponding to central cystic changes noted on OCT  - S/P IVA OS #1 (01.23.19), #2 (02.25.19), #3 (04.01.19), #4 (05.07.19), #5 (06.25.19), #6 (02.21.20), #7 (09.16.20), #8 (10.21.20), #9 (12.08.20), #10 (01.22.21)  - lost to follow up from February to Sept 2020 and from January 2021 to November 2022  - OCT today shows Interval improvement in IRF temporal fovea and macula  - BCVA OS stable at 20/30  - recommend IVA OS #11 today (11.21.22)  - RBA of procedure discussed, questions answered  - informed consent for IVA OS obtained and signed today, 11.21.22  - see procedure note  - F/U 8 weeks -- DFE/OCT/possible injection  2,3. Hypertensive retinopathy OU  - discussed importance of tight BP control  - monitor  4. PVD OS  - Discussed findings and prognosis  - No RT or RD on 360 peripheral  exam  - Reviewed s/s of RT/RD  - Strict return precautions for any such RT/RD symptoms  5. Glaucoma Suspect OS  - under the expert management of Dr. Kathlen Mody  6,7. Pseudophakia OU  - s/p CE/IOL OU 11 yrs ago at [facility]  - s/p YAG capsulotomy OU  - OS re-YAG'd by Dr. Kathlen Mody -- PC opening improved  - monitor  Ophthalmic Meds Ordered this visit:  Meds ordered this encounter  Medications   Bevacizumab (AVASTIN) SOLN 1.25 mg     Return in about 8 weeks (around 05/13/2021) for f/u BRVO OS, DFE, OCT.  There are no Patient Instructions on file for this visit.   Explained the diagnoses, plan, and follow up with the patient and they expressed understanding.  Patient expressed understanding of the importance of proper follow up care.   This document serves as a record of services personally performed by Gardiner Sleeper, MD, PhD. It was created on their behalf by Roselee Nova, COMT. The creation of this record is the provider's dictation and/or activities during the visit.  Electronically signed by: Roselee Nova, COMT 03/18/21 2:06 PM  Gardiner Sleeper, M.D., Ph.D. Diseases & Surgery of the Retina and Vitreous [facility]  I have reviewed the above documentation for accuracy and completeness, and I agree with the above. Gardiner Sleeper, M.D., Ph.D. 03/18/21 2:06 PM   Abbreviations: M myopia (nearsighted); A astigmatism; H hyperopia (farsighted); P presbyopia; Mrx spectacle prescription;  CTL contact lenses; OD right eye; OS left eye; OU both eyes  XT exotropia; ET esotropia; PEK punctate epithelial keratitis; PEE punctate epithelial erosions; DES dry eye syndrome; MGD meibomian gland dysfunction; ATs artificial tears; PFAT's preservative free artificial tears; [place] nuclear sclerotic cataract; PSC posterior subcapsular cataract; ERM epi-retinal membrane; PVD posterior vitreous detachment; RD retinal detachment; DM diabetes mellitus; DR diabetic retinopathy; NPDR  non-proliferative diabetic retinopathy; PDR proliferative diabetic retinopathy; CSME clinically significant macular edema; DME diabetic macular edema; dbh dot blot hemorrhages; CWS cotton wool spot; POAG primary open angle glaucoma; C/D cup-to-disc ratio; HVF humphrey visual field; GVF goldmann visual field; OCT optical coherence tomography; IOP intraocular pressure; BRVO Branch retinal vein occlusion; CRVO central retinal vein occlusion; CRAO central retinal artery occlusion; BRAO branch retinal artery occlusion; RT retinal tear; SB scleral buckle; PPV pars plana vitrectomy; VH Vitreous hemorrhage; PRP panretinal laser photocoagulation; IVK intravitreal kenalog; VMT vitreomacular traction; MH Macular hole;  NVD neovascularization of the disc; NVE neovascularization elsewhere; AREDS age related eye disease study; ARMD age related macular degeneration; POAG primary open angle glaucoma; EBMD epithelial/anterior basement membrane dystrophy; ACIOL anterior chamber intraocular lens; IOL intraocular lens; PCIOL posterior chamber intraocular lens; Phaco/IOL phacoemulsification with intraocular lens placement; [place] photorefractive keratectomy; LASIK laser assisted in situ keratomileusis; HTN hypertension; DM diabetes mellitus; COPD chronic obstructive pulmonary disease

## 2021-03-18 ENCOUNTER — Encounter (INDEPENDENT_AMBULATORY_CARE_PROVIDER_SITE_OTHER): Payer: Self-pay | Admitting: Ophthalmology

## 2021-03-18 ENCOUNTER — Ambulatory Visit (INDEPENDENT_AMBULATORY_CARE_PROVIDER_SITE_OTHER): Payer: Medicare Other | Admitting: Ophthalmology

## 2021-03-18 ENCOUNTER — Other Ambulatory Visit: Payer: Self-pay

## 2021-03-18 DIAGNOSIS — H40002 Preglaucoma, unspecified, left eye: Secondary | ICD-10-CM

## 2021-03-18 DIAGNOSIS — H43812 Vitreous degeneration, left eye: Secondary | ICD-10-CM

## 2021-03-18 DIAGNOSIS — H34832 Tributary (branch) retinal vein occlusion, left eye, with macular edema: Secondary | ICD-10-CM | POA: Diagnosis not present

## 2021-03-18 DIAGNOSIS — H35033 Hypertensive retinopathy, bilateral: Secondary | ICD-10-CM | POA: Diagnosis not present

## 2021-03-18 DIAGNOSIS — Z961 Presence of intraocular lens: Secondary | ICD-10-CM | POA: Diagnosis not present

## 2021-03-18 DIAGNOSIS — I10 Essential (primary) hypertension: Secondary | ICD-10-CM | POA: Diagnosis not present

## 2021-03-18 DIAGNOSIS — H26492 Other secondary cataract, left eye: Secondary | ICD-10-CM

## 2021-03-18 MED ORDER — BEVACIZUMAB CHEMO INJECTION 1.25MG/0.05ML SYRINGE FOR KALEIDOSCOPE
1.2500 mg | INTRAVITREAL | Status: AC | PRN
  Administered 2021-03-18: 1.25 mg via INTRAVITREAL

## 2021-04-02 DIAGNOSIS — K2100 Gastro-esophageal reflux disease with esophagitis, without bleeding: Secondary | ICD-10-CM | POA: Diagnosis not present

## 2021-04-02 DIAGNOSIS — R7301 Impaired fasting glucose: Secondary | ICD-10-CM | POA: Diagnosis not present

## 2021-04-02 DIAGNOSIS — I10 Essential (primary) hypertension: Secondary | ICD-10-CM | POA: Diagnosis not present

## 2021-04-02 DIAGNOSIS — E7849 Other hyperlipidemia: Secondary | ICD-10-CM | POA: Diagnosis not present

## 2021-04-02 DIAGNOSIS — E7800 Pure hypercholesterolemia, unspecified: Secondary | ICD-10-CM | POA: Diagnosis not present

## 2021-04-02 DIAGNOSIS — E782 Mixed hyperlipidemia: Secondary | ICD-10-CM | POA: Diagnosis not present

## 2021-04-04 ENCOUNTER — Encounter: Payer: Self-pay | Admitting: Cardiology

## 2021-04-04 ENCOUNTER — Ambulatory Visit: Payer: Medicare Other | Admitting: Cardiology

## 2021-04-04 VITALS — BP 124/75 | HR 56 | Ht 63.0 in | Wt 143.6 lb

## 2021-04-04 DIAGNOSIS — I350 Nonrheumatic aortic (valve) stenosis: Secondary | ICD-10-CM

## 2021-04-04 DIAGNOSIS — I340 Nonrheumatic mitral (valve) insufficiency: Secondary | ICD-10-CM

## 2021-04-04 DIAGNOSIS — R0989 Other specified symptoms and signs involving the circulatory and respiratory systems: Secondary | ICD-10-CM

## 2021-04-04 NOTE — Progress Notes (Signed)
Clinical Summary Ms. Soohoo is a 88 y.o.female former patient of Dr Bronson Ing, this is our first visit together. Seen for the following medical problems  1.Aortic stenosis 05/2019 echo: LVEF 55-60%, grade I dd, mod MR, mod AS mean grad 24 AVA VTI 1.25  - no SOB, DOE. Mild LE edema at times.   2. Chronic LBBB   3. Mitral regurgitation - mod by 05/2019 echo  Past Medical History:  Diagnosis Date   Aortic stenosis    Arrhythmia    palpitations   Arthritis    Cancer (HCC)    squamous cell Left hand   Complication of anesthesia    slow to wake up   Depression    Lost a son to cancer   Headache    hx of migraines   Hypertension    Hypertensive retinopathy    OU   Left bundle Tearia Gibbs block      Allergies  Allergen Reactions   Ace Inhibitors Cough   Caffeine Other (See Comments)    "shoots my BP up"   Nsaids Other (See Comments)    GI upset     Peanut-Containing Drug Products Other (See Comments)    headaches   Ultram [Tramadol Hcl] Other (See Comments)    Headaches. Patient is unsure of this.   Penicillins Itching and Rash    Has patient had a PCN reaction causing immediate rash, facial/tongue/throat swelling, SOB or lightheadedness with hypotension: No Has patient had a PCN reaction causing severe rash involving mucus membranes or skin necrosis: No Has patient had a PCN reaction that required hospitalization No Has patient had a PCN reaction occurring within the last 10 years: No If all of the above answers are "NO", then may proceed with Cephalosporin use.  Tolerated Cephalosporin Date: 01/03/20.       Current Outpatient Medications  Medication Sig Dispense Refill   hydrochlorothiazide (HYDRODIURIL) 12.5 MG tablet Take 12.5 mg by mouth daily as needed (salt intake).     hydroxypropyl methylcellulose / hypromellose (ISOPTO TEARS / GONIOVISC) 2.5 % ophthalmic solution Place 1 drop into both eyes 3 (three) times daily as needed for dry eyes.     Current  Facility-Administered Medications  Medication Dose Route Frequency Provider Last Rate Last Admin   Bevacizumab (AVASTIN) SOLN 1.25 mg  1.25 mg Intravitreal  Bernarda Caffey, MD   1.25 mg at 04/20/17 1138   Bevacizumab (AVASTIN) SOLN 1.25 mg  1.25 mg Intravitreal  Bernarda Caffey, MD   1.25 mg at 04/20/17 1455   Bevacizumab (AVASTIN) SOLN 1.25 mg  1.25 mg Intravitreal  Bernarda Caffey, MD   1.25 mg at 05/25/17 1345   Bevacizumab (AVASTIN) SOLN 1.25 mg  1.25 mg Intravitreal  Bernarda Caffey, MD   1.25 mg at 06/30/17 1351   Bevacizumab (AVASTIN) SOLN 1.25 mg  1.25 mg Intravitreal  Bernarda Caffey, MD   1.25 mg at 08/23/17 0034   Bevacizumab (AVASTIN) SOLN 1.25 mg  1.25 mg Intravitreal  Bernarda Caffey, MD   1.25 mg at 04/16/18 1349     Past Surgical History:  Procedure Laterality Date   APPENDECTOMY     CATARACT EXTRACTION Bilateral    OU about 10 yrs ago   COLONOSCOPY WITH PROPOFOL N/A 05/01/2020   Surgeon: Eloise Harman, DO;Nonbleeding internal hemorrhoids, pancolonic diverticulosis, 1 7 mm tubular adenoma resected, single nonbleeding colonic angioectasia treated with APC.  No recommendations to repeat colonoscopy due to age.   EYE SURGERY     laser  fibroadenoma left breast removal     HOT HEMOSTASIS  05/01/2020   Procedure: HOT HEMOSTASIS (ARGON PLASMA COAGULATION/BICAP);  Surgeon: Eloise Harman, DO;  [facility];  Service: Endoscopy;;   OTHER SURGICAL HISTORY     appendectomy   POLYPECTOMY  05/01/2020   Procedure: POLYPECTOMY;  Surgeon: Eloise Harman, DO;  [facility];  Service: Endoscopy;;   TOTAL HIP ARTHROPLASTY Left 06/18/2016   Procedure: LEFT TOTAL HIP ARTHROPLASTY ANTERIOR APPROACH;  Surgeon: Gaynelle Arabian, MD;  Location: [facility];  Service: Orthopedics;  Laterality: Left;   TOTAL HIP ARTHROPLASTY Right 05/05/2018   Procedure: TOTAL HIP ARTHROPLASTY ANTERIOR APPROACH;  Surgeon: Gaynelle Arabian, MD;  Location: [facility];  Service: Orthopedics;  Laterality:  Right;   TOTAL KNEE ARTHROPLASTY Right 01/02/2020   Procedure: TOTAL KNEE ARTHROPLASTY;  Surgeon: Gaynelle Arabian, MD;  Location: [facility];  Service: Orthopedics;  Laterality: Right;  73min   UTERINE FIBROID SURGERY       Allergies  Allergen Reactions   Ace Inhibitors Cough   Caffeine Other (See Comments)    "shoots my BP up"   Nsaids Other (See Comments)    GI upset     Peanut-Containing Drug Products Other (See Comments)    headaches   Ultram [Tramadol Hcl] Other (See Comments)    Headaches. Patient is unsure of this.   Penicillins Itching and Rash    Has patient had a PCN reaction causing immediate rash, facial/tongue/throat swelling, SOB or lightheadedness with hypotension: No Has patient had a PCN reaction causing severe rash involving mucus membranes or skin necrosis: No Has patient had a PCN reaction that required hospitalization No Has patient had a PCN reaction occurring within the last 10 years: No If all of the above answers are "NO", then may proceed with Cephalosporin use.  Tolerated Cephalosporin Date: 01/03/20.        Family History  Problem Relation Age of Onset   Heart disease Mother    Heart attack Mother    Heart failure Mother    Stroke Father    Heart attack Maternal Aunt    Heart disease Maternal Aunt    Heart failure Maternal Aunt    Amblyopia Neg Hx    Blindness Neg Hx    Cataracts Neg Hx    Glaucoma Neg Hx    Macular degeneration Neg Hx    Retinal detachment Neg Hx    Strabismus Neg Hx    Retinitis pigmentosa Neg Hx    Colon cancer Neg Hx      Social History Ms. Helman reports that she quit smoking about 56 years ago. Her smoking use included cigarettes. She has a 0.75 pack-year smoking history. She has never used smokeless tobacco. Ms. Mcclarty reports no history of alcohol use.   Review of Systems CONSTITUTIONAL: No weight loss, fever, chills, weakness or fatigue.  HEENT: Eyes: No visual loss, blurred vision, double vision or yellow  sclerae.No hearing loss, sneezing, congestion, runny nose or sore throat.  SKIN: No rash or itching.  CARDIOVASCULAR: per hpi RESPIRATORY: No shortness of breath, cough or sputum.  GASTROINTESTINAL: No anorexia, nausea, vomiting or diarrhea. No abdominal pain or blood.  GENITOURINARY: No burning on urination, no polyuria NEUROLOGICAL: No headache, dizziness, syncope, paralysis, ataxia, numbness or tingling in the extremities. No change in bowel or bladder control.  MUSCULOSKELETAL: No muscle, back pain, joint pain or stiffness.  LYMPHATICS: No enlarged nodes. No history of splenectomy.  PSYCHIATRIC: No history of depression or  anxiety.  ENDOCRINOLOGIC: No reports of sweating, cold or heat intolerance. No polyuria or polydipsia.  .   Today's Vitals   04/04/21 1526 04/04/21 1604  BP: 98/68 124/75  Pulse: (!) 56   SpO2: 98%   Weight: 143 lb 9.6 oz (65.1 kg)   Height: 5\' 3"  (1.6 m)    Body mass index is 25.44 kg/m.   Body mass index is 25.44 kg/m.  Body mass index is 25.44 kg/m.  Gen: resting comfortably, no acute distress HEENT: no scleral icterus, pupils equal round and reactive, no palptable cervical adenopathy,  CV: JXB,1/4 systolic murmur rusb, 2/6 systolic murmur apex. Bilaterl carotid bruits Resp: Clear to auscultation bilaterally GI: abdomen is soft, non-tender, non-distended, normal bowel sounds, no hepatosplenomegaly MSK: extremities are warm, no edema.  Skin: warm, no rash Neuro:  no focal deficits Psych: appropriate affect   Diagnostic Studies Echocardiogram 04/21/2018:   1. The left ventricle has normal systolic function with an ejection  fraction of 60-65%. The cavity size was normal. There is mildly increased  left ventricular wall thickness. Left ventricular diastolic Doppler  parameters are consistent with impaired  relaxation Elevated mean left atrial pressure.   2. The right ventricle has normal systolic function. The cavity was  normal. There is no  increase in right ventricular wall thickness.   3. Left atrial size was mildly dilated.   4. The pericardial effusion is circumferential.   5. Trivial pericardial effusion is present.   6. The aortic valve has an indeterminant number of cusps Moderate  thickening of the aortic valve Moderate calcification of the aortic valve.  Aortic valve regurgitation is mild by color flow Doppler. moderate  stenosis of the aortic valve. Moderate aortic   annular calcification noted.   7. The mitral valve is normal in structure. Mild thickening of the mitral  valve leaflet. Mild calcification of the mitral valve leaflet. There is  mild mitral annular calcification present. No evidence of mitral valve  stenosis.   8. The tricuspid valve is normal in structure.   9. The aortic root is normal in size and structure.  10. Pulmonary hypertension is normal, PASP is 24.      She underwent a normal nuclear stress test on 05/23/16.    05/2019 echo IMPRESSIONS     1. Left ventricular ejection fraction, by estimation, is 55 to 60%. The  left ventricle has normal function. The left ventricle has no regional  wall motion abnormalities. Left ventricular diastolic parameters are  consistent with Grade I diastolic  dysfunction (impaired relaxation). Elevated left ventricular end-diastolic  pressure.   2. Right ventricular systolic function is low normal. The right  ventricular size is normal. Mildly increased right ventricular wall  thickness. There is moderately elevated pulmonary artery systolic  pressure.   3. Left atrial size was mildly dilated.   4. Small circumferential pericardial effusion without hemodynamic  compromise. The pericardial effusion is circumferential. There is no  evidence of cardiac tamponade.   5. The mitral valve is degenerative. Moderate mitral valve regurgitation.   6. Tricuspid valve regurgitation is mild to moderate.   7. The aortic valve is tricuspid. Aortic valve regurgitation  is mild.  Moderate aortic valve stenosis. Aortic valve area, by VTI measures 1.25  cm. Aortic valve mean gradient measures 24.2 mmHg. Aortic valve Vmax  measures 3.21 m/s.   8. The inferior vena cava is normal in size with greater than 50%  respiratory variability, suggesting right atrial pressure of 3 mmHg.  Assessment and Plan  1.Valvular heart disease - prior echo with mod AS and mod MR - will repeat echo for ongoing sureveillance  2. Carotid bruits - obtain carotid US   EKG today shows mild sinus brady 55, chronic LBBB     Arnoldo Lenis, M.D.

## 2021-04-04 NOTE — Addendum Note (Signed)
Addended by: Laurine Blazer on: 04/04/2021 04:15 PM   Modules accepted: Orders

## 2021-04-04 NOTE — Patient Instructions (Signed)
Medication Instructions:  Continue all current medications.  Labwork: none  Testing/Procedures: Your physician has requested that you have an echocardiogram. Echocardiography is a painless test that uses sound waves to create images of your heart. It provides your doctor with information about the size and shape of your heart and how well your hearts chambers and valves are working. This procedure takes approximately one hour. There are no restrictions for this procedure. Your physician has requested that you have a carotid duplex. This test is an ultrasound of the carotid arteries in your neck. It looks at blood flow through these arteries that supply the brain with blood. Allow one hour for this exam. There are no restrictions or special instructions. Office will contact with results via phone or letter.     Follow-Up: 6 months   Any Other Special Instructions Will Be Listed Below (If Applicable).   If you need a refill on your cardiac medications before your next appointment, please call your pharmacy.

## 2021-04-04 NOTE — Addendum Note (Signed)
Addended by: Laurine Blazer on: 04/04/2021 04:19 PM   Modules accepted: Orders

## 2021-04-05 ENCOUNTER — Encounter: Payer: Self-pay | Admitting: *Deleted

## 2021-04-05 DIAGNOSIS — I10 Essential (primary) hypertension: Secondary | ICD-10-CM | POA: Diagnosis not present

## 2021-04-05 DIAGNOSIS — E7849 Other hyperlipidemia: Secondary | ICD-10-CM | POA: Diagnosis not present

## 2021-04-05 DIAGNOSIS — D649 Anemia, unspecified: Secondary | ICD-10-CM | POA: Diagnosis not present

## 2021-04-05 DIAGNOSIS — K2100 Gastro-esophageal reflux disease with esophagitis, without bleeding: Secondary | ICD-10-CM | POA: Diagnosis not present

## 2021-04-05 DIAGNOSIS — R011 Cardiac murmur, unspecified: Secondary | ICD-10-CM | POA: Diagnosis not present

## 2021-04-05 DIAGNOSIS — Z23 Encounter for immunization: Secondary | ICD-10-CM | POA: Diagnosis not present

## 2021-04-05 DIAGNOSIS — R7301 Impaired fasting glucose: Secondary | ICD-10-CM | POA: Diagnosis not present

## 2021-05-13 ENCOUNTER — Encounter (INDEPENDENT_AMBULATORY_CARE_PROVIDER_SITE_OTHER): Payer: Medicare Other | Admitting: Ophthalmology

## 2021-05-16 ENCOUNTER — Ambulatory Visit (INDEPENDENT_AMBULATORY_CARE_PROVIDER_SITE_OTHER): Payer: Medicare Other

## 2021-05-16 DIAGNOSIS — I350 Nonrheumatic aortic (valve) stenosis: Secondary | ICD-10-CM | POA: Diagnosis not present

## 2021-05-16 DIAGNOSIS — R0989 Other specified symptoms and signs involving the circulatory and respiratory systems: Secondary | ICD-10-CM | POA: Diagnosis not present

## 2021-05-16 LAB — ECHOCARDIOGRAM COMPLETE
AR max vel: 1.15 cm2
AV Area VTI: 1.02 cm2
AV Area mean vel: 1.02 cm2
AV Mean grad: 28.2 mmHg
AV Peak grad: 42.6 mmHg
AV Vena cont: 0.33 cm
Ao pk vel: 3.26 m/s
Area-P 1/2: 2.19 cm2
Calc EF: 62.7 %
MV VTI: 2.51 cm2
P 1/2 time: 764 msec
S' Lateral: 2.97 cm
Single Plane A2C EF: 65.6 %
Single Plane A4C EF: 60.8 %

## 2021-05-17 NOTE — Progress Notes (Signed)
?01/14/2021 ?  ? ?CHIEF COMPLAINT ?Patient presents for Retina Follow Up ? ? ?HISTORY OF PRESENT ILLNESS: ?Martha Richard is a 88 y.o. female who presents to the clinic today for:  ? ?HPI   ? ? 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  Duration of 9 weeks.  Since onset it is stable.  I, the attending physician,  performed the HPI with the patient and updated documentation appropriately. ? ?  ?  ? ? Comments   ?9 week follow up BRVO OS- In the mornings it seems like vision is better without her glasses than with them.   ? ?  ?  ?Last edited by Bernarda Caffey, MD on 05/20/2021 11:18 PM.  ?  ?Pt states vision is no better, she is delayed to 9 weeks from 8 weeks due to illness ? ?Referring physician: ?Sasser, Silvestre Moment, MD ?[address] ?[place],  [place] [address] ? ?HISTORICAL INFORMATION:  ? ?Selected notes from the [facility] ?Referred by Dr. Read Drivers for concern of cystic IRF OS ?  ? ?CURRENT MEDICATIONS: ?Current Outpatient Medications (Ophthalmic Drugs)  ?Medication Sig  ? hydroxypropyl methylcellulose / hypromellose (ISOPTO TEARS / GONIOVISC) 2.5 % ophthalmic solution Place 1 drop into both eyes 3 (three) times daily as needed for dry eyes.  ? ?No current facility-administered medications for this visit. (Ophthalmic Drugs)  ? ?Current Outpatient Medications (Other)  ?Medication Sig  ? hydrochlorothiazide (HYDRODIURIL) 12.5 MG tablet Take 12.5 mg by mouth daily as needed (salt intake).  ? ?Current Facility-Administered Medications (Other)  ?Medication Route  ? Bevacizumab (AVASTIN) SOLN 1.25 mg Intravitreal  ? Bevacizumab (AVASTIN) SOLN 1.25 mg Intravitreal  ? Bevacizumab (AVASTIN) SOLN 1.25 mg Intravitreal  ? Bevacizumab (AVASTIN) SOLN 1.25 mg Intravitreal  ? Bevacizumab (AVASTIN) SOLN 1.25 mg Intravitreal  ? Bevacizumab (AVASTIN) SOLN 1.25 mg Intravitreal  ? ?REVIEW OF SYSTEMS: ?ROS   ?Positive for: Musculoskeletal, Cardiovascular, Eyes ?Negative for: Constitutional, Gastrointestinal,  Neurological, Skin, Genitourinary, HENT, Endocrine, Respiratory, Psychiatric, Allergic/Imm, Heme/Lymph ?Last edited by Leonie Douglas, COA on 05/20/2021  1:47 PM.  ?  ? ? ? ?ALLERGIES ?Allergies  ?Allergen Reactions  ? Ace Inhibitors Cough  ? Caffeine Other (See Comments)  ?  "shoots my BP up"  ? Nsaids Other (See Comments)  ?  GI upset  ?  ? Peanut-Containing Drug Products Other (See Comments)  ?  headaches  ? Ultram [Tramadol Hcl] Other (See Comments)  ?  Headaches. Patient is unsure of this.  ? Penicillins Itching and Rash  ?  Has patient had a PCN reaction causing immediate rash, facial/tongue/throat swelling, SOB or lightheadedness with hypotension: No ?Has patient had a PCN reaction causing severe rash involving mucus membranes or skin necrosis: No ?Has patient had a PCN reaction that required hospitalization No ?Has patient had a PCN reaction occurring within the last 10 years: No ?If all of the above answers are "NO", then may proceed with Cephalosporin use. ? ?Tolerated Cephalosporin Date: 01/03/20. ?   ? ?PAST MEDICAL HISTORY ?Past Medical History:  ?Diagnosis Date  ? Aortic stenosis   ? Arrhythmia   ? palpitations  ? Arthritis   ? Cancer [facility])   ? squamous cell Left hand  ? Complication of anesthesia   ? slow to wake up  ? Depression   ? Lost a son to cancer  ? Headache   ? hx of migraines  ? Hypertension   ? Hypertensive retinopathy   ?  OU  ? Left bundle branch block   ? ?Past Surgical History:  ?Procedure Laterality Date  ? APPENDECTOMY    ? CATARACT EXTRACTION Bilateral   ? OU about 10 yrs ago  ? COLONOSCOPY WITH PROPOFOL N/A 05/01/2020  ? Surgeon: Eloise Harman, DO;Nonbleeding internal hemorrhoids, pancolonic diverticulosis, 1 7 mm tubular adenoma resected, single nonbleeding colonic angioectasia treated with APC.  No recommendations to repeat colonoscopy due to age.  ? EYE SURGERY    ? laser  ? fibroadenoma left breast removal    ? HOT HEMOSTASIS  05/01/2020  ? Procedure: HOT HEMOSTASIS (ARGON  PLASMA COAGULATION/BICAP);  Surgeon: Eloise Harman, DO;  [facility];  Service: Endoscopy;;  ? OTHER SURGICAL HISTORY    ? appendectomy  ? POLYPECTOMY  05/01/2020  ? Procedure: POLYPECTOMY;  Surgeon: Eloise Harman, DO;  [facility];  Service: Endoscopy;;  ? TOTAL HIP ARTHROPLASTY Left 06/18/2016  ? Procedure: LEFT TOTAL HIP ARTHROPLASTY ANTERIOR APPROACH;  Surgeon: Gaynelle Arabian, MD;  [facility];  Service: Orthopedics;  Laterality: Left;  ? TOTAL HIP ARTHROPLASTY Right 05/05/2018  ? Procedure: TOTAL HIP ARTHROPLASTY ANTERIOR APPROACH;  Surgeon: Gaynelle Arabian, MD;  Location[facility];  Service: Orthopedics;  Laterality: Right;  ? TOTAL KNEE ARTHROPLASTY Right 01/02/2020  ? Procedure: TOTAL KNEE ARTHROPLASTY;  Surgeon: Gaynelle Arabian, MD;  Location: [facility];  Service: Orthopedics;  Laterality: Right;  42mn  ? UTERINE FIBROID SURGERY    ? ?FAMILY HISTORY ?Family History  ?Problem Relation Age of Onset  ? Heart disease Mother   ? Heart attack Mother   ? Heart failure Mother   ? Stroke Father   ? Heart attack Maternal Aunt   ? Heart disease Maternal Aunt   ? Heart failure Maternal Aunt   ? Amblyopia Neg Hx   ? Blindness Neg Hx   ? Cataracts Neg Hx   ? Glaucoma Neg Hx   ? Macular degeneration Neg Hx   ? Retinal detachment Neg Hx   ? Strabismus Neg Hx   ? Retinitis pigmentosa Neg Hx   ? Colon cancer Neg Hx   ? ? ?SOCIAL HISTORY ?Social History  ? ?Tobacco Use  ? Smoking status: Former  ?  Packs/day: 0.25  ?  Years: 3.00  ?  Pack years: 0.75  ?  Types: Cigarettes  ?  Quit date: 04/27/1964  ?  Years since quitting: 585.1 ? Smokeless tobacco: Never  ? Tobacco comments:  ?  years ago 50 plus years  ?Vaping Use  ? Vaping Use: Never used  ?Substance Use Topics  ? Alcohol use: No  ? Drug use: No  ?  ? ?  ?OPHTHALMIC EXAM: ?Base Eye Exam   ? ? Visual Acuity (Snellen - Linear)   ? ?   Right Left  ? Dist cc 20/20 20/50  ? Dist ph cc  20/40 -2  ? ? Correction: Glasses  ? ?  ?  ? ? Tonometry  (Tonopen, 1:56 PM)   ? ?   Right Left  ? Pressure 15 19  ? ?  ?  ? ? Pupils   ? ?   Dark Light Shape React APD  ? Right 2 1 Round Brisk None  ? Left 2 1 Round Brisk None  ? ?  ?  ? ? Visual Fields (Counting fingers)   ? ?   Left Right  ?  Full Full  ? ?  ?  ? ? Extraocular Movement   ? ?  Right Left  ?  Full Full  ? ?  ?  ? ? Neuro/Psych   ? ? Oriented x3: Yes  ? Mood/Affect: Normal  ? ?  ?  ? ? Dilation   ? ? Both eyes: 1.0% Mydriacyl, 2.5% Phenylephrine @ 1:56 PM  ? ?  ?  ? ?  ? ?Slit Lamp and Fundus Exam   ? ? Slit Lamp Exam   ? ?   Right Left  ? Lids/Lashes Dermatochalasis - upper lid, Meibomian gland dysfunction, Scurf Dermatochalasis - upper lid, Meibomian gland dysfunction  ? Conjunctiva/Sclera White and quiet White and quiet  ? Cornea Arcus, 2+ Punctate epithelial erosions, Debris in tear film, Well healed cataract wounds, nasal and temporal LRI Mild arcus, 2+ Punctate epithelial erosions, nasal and temporal LRI, Debris in tear film  ? Anterior Chamber Deep and quiet Deep and quiet  ? Iris Round and moderately dilated Round and moderately dilated   ? Lens PC IOL in good position with open PC PC IOL in good position, open PC  ? Anterior Vitreous Vitreous syneresis Vitreous syneresis, Posterior vitreous detachment  ? ?  ?  ? ? Fundus Exam   ? ?   Right Left  ? Disc Disc heme and focal edema at 0430 - improved, Pink and Sharp Tilted, +collateral vessels temporally, vascular loops, inferior rim thinning, temporal pallor, Sharp rim, +cupping  ? C/D Ratio 0.5 0.7  ? Macula Blunted foveal reflex, Retinal pigment epithelial mottling, trace cystic changes - stably improved, No heme or edema good foveal reflex; central cystic changes - stably improved, fine exudate superior macula, trace ERM  ? Vessels Inferior vessels 3+tortuosity, Vascular attenuation, Impending BRVO inferiorly, dilated venuels (inferior>superior), AV crossing changes, Copper wiring Vascular attenuation, Tortuous, AV crossing changes, dilated  venules, Copper wiring  ? Periphery Attached, scattered MAs Attached, scattered MAs  ? ?  ?  ? ?  ? ?Refraction   ? ? Wearing Rx   ? ?   Sphere Cylinder Axis Add  ? Right +1.50 +1.50 014 +2.50  ? Left +2.00 +1.00 178 +2.

## 2021-05-20 ENCOUNTER — Ambulatory Visit (INDEPENDENT_AMBULATORY_CARE_PROVIDER_SITE_OTHER): Payer: Medicare Other | Admitting: Ophthalmology

## 2021-05-20 ENCOUNTER — Other Ambulatory Visit: Payer: Self-pay

## 2021-05-20 ENCOUNTER — Encounter (INDEPENDENT_AMBULATORY_CARE_PROVIDER_SITE_OTHER): Payer: Self-pay | Admitting: Ophthalmology

## 2021-05-20 DIAGNOSIS — I10 Essential (primary) hypertension: Secondary | ICD-10-CM | POA: Diagnosis not present

## 2021-05-20 DIAGNOSIS — H34832 Tributary (branch) retinal vein occlusion, left eye, with macular edema: Secondary | ICD-10-CM

## 2021-05-20 DIAGNOSIS — H35033 Hypertensive retinopathy, bilateral: Secondary | ICD-10-CM

## 2021-05-20 DIAGNOSIS — Z961 Presence of intraocular lens: Secondary | ICD-10-CM | POA: Diagnosis not present

## 2021-05-20 DIAGNOSIS — H40002 Preglaucoma, unspecified, left eye: Secondary | ICD-10-CM

## 2021-05-20 DIAGNOSIS — H43812 Vitreous degeneration, left eye: Secondary | ICD-10-CM | POA: Diagnosis not present

## 2021-05-20 DIAGNOSIS — H26492 Other secondary cataract, left eye: Secondary | ICD-10-CM

## 2021-05-20 MED ORDER — BEVACIZUMAB CHEMO INJECTION 1.25MG/0.05ML SYRINGE FOR KALEIDOSCOPE
1.2500 mg | INTRAVITREAL | Status: AC | PRN
  Administered 2021-05-20: 1.25 mg via INTRAVITREAL

## 2021-05-23 ENCOUNTER — Telehealth: Payer: Self-pay | Admitting: *Deleted

## 2021-05-23 NOTE — Telephone Encounter (Signed)
Laurine Blazer, LPN  ?9/84/2103  1:28 PM EDT Back to Top  ?  ?Notified, copy to pcp.   ? ?

## 2021-05-23 NOTE — Telephone Encounter (Signed)
-----   Message from Arnoldo Lenis, MD sent at 05/22/2021 11:30 AM EDT ----- ?Carotid US shows moderate blockages on both sides, just something to monitor at this time. We will repeat US 1 year ? ? ?Zandra Abts MD ?

## 2021-05-27 ENCOUNTER — Telehealth: Payer: Self-pay

## 2021-05-27 NOTE — Telephone Encounter (Signed)
Patient notified and verbalized understanding. Patient had no questions or concerns at this time. PCP copied 

## 2021-05-27 NOTE — Telephone Encounter (Signed)
-----   Message from Arnoldo Lenis, MD sent at 05/27/2021  9:47 AM EDT ----- ?Echo shows aortic valve remains just moderately stiffened, just something to continue to monitor at this time. Small amount of fluid around the heart which is similar to prior echos and nothing of concern. ? ?Zandra Abts MD ?

## 2021-07-16 NOTE — Progress Notes (Shared)
01/14/2021    CHIEF COMPLAINT Patient presents for No chief complaint on file.   HISTORY OF PRESENT ILLNESS: Martha Richard is a 88 y.o. female who presents to the clinic today for:    Pt states vision is no better, she is delayed to 9 weeks from 8 weeks due to illness  Referring physician: Manon Hilding, MD [place],  [place] [address]  HISTORICAL INFORMATION:   Selected notes from the [facility] Referred by Dr. Read Drivers for concern of cystic IRF OS    CURRENT MEDICATIONS: Current Outpatient Medications (Ophthalmic Drugs)  Medication Sig   hydroxypropyl methylcellulose / hypromellose (ISOPTO TEARS / GONIOVISC) 2.5 % ophthalmic solution Place 1 drop into both eyes 3 (three) times daily as needed for dry eyes.   No current facility-administered medications for this visit. (Ophthalmic Drugs)   Current Outpatient Medications (Other)  Medication Sig   hydrochlorothiazide (HYDRODIURIL) 12.5 MG tablet Take 12.5 mg by mouth daily as needed (salt intake).   Current Facility-Administered Medications (Other)  Medication Route   Bevacizumab (AVASTIN) SOLN 1.25 mg Intravitreal   Bevacizumab (AVASTIN) SOLN 1.25 mg Intravitreal   Bevacizumab (AVASTIN) SOLN 1.25 mg Intravitreal   Bevacizumab (AVASTIN) SOLN 1.25 mg Intravitreal   Bevacizumab (AVASTIN) SOLN 1.25 mg Intravitreal   Bevacizumab (AVASTIN) SOLN 1.25 mg Intravitreal   REVIEW OF SYSTEMS:     ALLERGIES Allergies  Allergen Reactions   Ace Inhibitors Cough   Caffeine Other (See Comments)    "shoots my BP up"   Nsaids Other (See Comments)    GI upset     Peanut-Containing Drug Products Other (See Comments)    headaches   Ultram [Tramadol Hcl] Other (See Comments)    Headaches. Patient is unsure of this.   Penicillins Itching and Rash    Has patient had a PCN reaction causing immediate rash, facial/tongue/throat swelling, SOB or lightheadedness with hypotension: No Has patient had a PCN reaction  causing severe rash involving mucus membranes or skin necrosis: No Has patient had a PCN reaction that required hospitalization No Has patient had a PCN reaction occurring within the last 10 years: No If all of the above answers are "NO", then may proceed with Cephalosporin use.  Tolerated Cephalosporin Date: 01/03/20.     PAST MEDICAL HISTORY Past Medical History:  Diagnosis Date   Aortic stenosis    Arrhythmia    palpitations   Arthritis    Cancer (HCC)    squamous cell Left hand   Complication of anesthesia    slow to wake up   Depression    Lost a son to cancer   Headache    hx of migraines   Hypertension    Hypertensive retinopathy    OU   Left bundle branch block    Past Surgical History:  Procedure Laterality Date   APPENDECTOMY     CATARACT EXTRACTION Bilateral    OU about 10 yrs ago   COLONOSCOPY WITH PROPOFOL N/A 05/01/2020   Surgeon: Eloise Harman, DO;Nonbleeding internal hemorrhoids, pancolonic diverticulosis, 1 7 mm tubular adenoma resected, single nonbleeding colonic angioectasia treated with APC.  No recommendations to repeat colonoscopy due to age.   EYE SURGERY     laser   fibroadenoma left breast removal     HOT HEMOSTASIS  05/01/2020   Procedure: HOT HEMOSTASIS (ARGON PLASMA COAGULATION/BICAP);  Surgeon: Eloise Harman, DO;  Location: [facility];  Service: Endoscopy;;   OTHER SURGICAL HISTORY  appendectomy   POLYPECTOMY  05/01/2020   Procedure: POLYPECTOMY;  Surgeon: Eloise Harman, DO;  Location: [facility];  Service: Endoscopy;;   TOTAL HIP ARTHROPLASTY Left 06/18/2016   Procedure: LEFT TOTAL HIP ARTHROPLASTY ANTERIOR APPROACH;  Surgeon: Gaynelle Arabian, MD;  Location: [facility];  Service: Orthopedics;  Laterality: Left;   TOTAL HIP ARTHROPLASTY Right 05/05/2018   Procedure: TOTAL HIP ARTHROPLASTY ANTERIOR APPROACH;  Surgeon: Gaynelle Arabian, MD;  Location: [facility];  Service: Orthopedics;  Laterality: Right;   TOTAL KNEE ARTHROPLASTY  Right 01/02/2020   Procedure: TOTAL KNEE ARTHROPLASTY;  Surgeon: Gaynelle Arabian, MD;  Location: [facility];  Service: Orthopedics;  Laterality: Right;  80mn   UTERINE FIBROID SURGERY     FAMILY HISTORY Family History  Problem Relation Age of Onset   Heart disease Mother    Heart attack Mother    Heart failure Mother    Stroke Father    Heart attack Maternal Aunt    Heart disease Maternal Aunt    Heart failure Maternal Aunt    Amblyopia Neg Hx    Blindness Neg Hx    Cataracts Neg Hx    Glaucoma Neg Hx    Macular degeneration Neg Hx    Retinal detachment Neg Hx    Strabismus Neg Hx    Retinitis pigmentosa Neg Hx    Colon cancer Neg Hx     SOCIAL HISTORY Social History   Tobacco Use   Smoking status: Former    Packs/day: 0.25    Years: 3.00    Pack years: 0.75    Types: Cigarettes    Quit date: 04/27/1964    Years since quitting: 57.2   Smokeless tobacco: Never   Tobacco comments:    years ago 587plus years  Vaping Use   Vaping Use: Never used  Substance Use Topics   Alcohol use: No   Drug use: No       OPHTHALMIC EXAM: Not recorded    IMAGING AND PROCEDURES  Imaging and Procedures for 05/25/17          ASSESSMENT/PLAN:    ICD-10-CM   1. Branch retinal vein occlusion of left eye with macular edema  H34.8320     2. Essential hypertension  I10     3. Hypertensive retinopathy of both eyes  H35.033     4. Posterior vitreous detachment of left eye  H43.812     5. 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 Pseudophakia of both eyes  Z96.1     7. PCO (posterior capsular opacification), left  H26.492       1. BRVO w/ macular edema OS  - delayed f/u from 8 wks ot 9 due to illness  - delayed follow up from 4-5 weeks to 22 months (from Jan 2021 to Nov 2022)  - on presenting exam, superior temporal venule was dilated and tortuous but no significant intraretinal hemorrhages suggesting perhaps a remote BRVO event had occurred  - FA at presentation  (01.23.19) shows significant leakage from branches from the superior temporal venules corresponding to central cystic changes noted on OCT  - S/P IVA OS #1 (01.23.19), #2 (02.25.19), #3 (04.01.19), #4 (05.07.19), #5 (06.25.19), #6 (02.21.20), #7 (09.16.20), #8 (10.21.20), #9 (12.08.20), #10 (01.22.21), #11 (11.21.22), #12 (01.23.23), #13 (03.27.23)  - lost to follow up from February to Sept 2020 and from January 2021 to November 2022  - OCT today shows stable improvement in IRF temporal fovea and macula  at 9 weeks  - BCVA OS stable at 20/30  - recommend IVA OS #14 today (05.30.23) with follow up extended to 10 weeks  - RBA of procedure discussed, questions answered  - informed consent for IVA OS obtained and signed today, 11.21.22  - see procedure note  - F/U 10 weeks -- DFE/OCT/possible injection  2,3. Hypertensive retinopathy OU  - discussed importance of tight BP control  - monitor  4. PVD OS  - Discussed findings and prognosis  - No RT or RD on 360 peripheral exam  - Reviewed s/s of RT/RD  - Strict return precautions for any such RT/RD symptoms  5. Glaucoma Suspect OS  - under the expert management of Dr. Kathlen Mody  6,7. Pseudophakia OU  - s/p CE/IOL OU 11 yrs ago at [facility]  - s/p YAG capsulotomy OU  - OS re-YAG'd by Dr. Kathlen Mody -- PC opening improved  - monitor  Ophthalmic Meds Ordered this visit:  No orders of the defined types were placed in this encounter.    No follow-ups on file.  There are no Patient Instructions on file for this visit.   This document serves as a record of services personally performed by Gardiner Sleeper, MD, PhD. It was created on their behalf by Annie Paras, COT, an ophthalmic technician. The creation of this record is the provider's dictation and/or activities during the visit.    Electronically signed by: Annie Paras, COT 07/16/21 10:26 AM   Gardiner Sleeper, M.D., Ph.D. Diseases & Surgery of the Retina and  Vitreous [facility]: M myopia (nearsighted); A astigmatism; H hyperopia (farsighted); P presbyopia; Mrx spectacle prescription;  CTL contact lenses; OD right eye; OS left eye; OU both eyes  XT exotropia; ET esotropia; PEK punctate epithelial keratitis; PEE punctate epithelial erosions; DES dry eye syndrome; MGD meibomian gland dysfunction; ATs artificial tears; PFAT's preservative free artificial tears; [place] nuclear sclerotic cataract; PSC posterior subcapsular cataract; ERM epi-retinal membrane; PVD posterior vitreous detachment; RD retinal detachment; DM diabetes mellitus; DR diabetic retinopathy; NPDR non-proliferative diabetic retinopathy; PDR proliferative diabetic retinopathy; CSME clinically significant macular edema; DME diabetic macular edema; dbh dot blot hemorrhages; CWS cotton wool spot; POAG primary open angle glaucoma; C/D cup-to-disc ratio; HVF humphrey visual field; GVF goldmann visual field; OCT optical coherence tomography; IOP intraocular pressure; BRVO Branch retinal vein occlusion; CRVO central retinal vein occlusion; CRAO central retinal artery occlusion; BRAO branch retinal artery occlusion; RT retinal tear; SB scleral buckle; PPV pars plana vitrectomy; VH Vitreous hemorrhage; PRP panretinal laser photocoagulation; IVK intravitreal kenalog; VMT vitreomacular traction; MH Macular hole;  NVD neovascularization of the disc; NVE neovascularization elsewhere; AREDS age related eye disease study; ARMD age related macular degeneration; POAG primary open angle glaucoma; EBMD epithelial/anterior basement membrane dystrophy; ACIOL anterior chamber intraocular lens; IOL intraocular lens; PCIOL posterior chamber intraocular lens; Phaco/IOL phacoemulsification with intraocular lens placement; [place] photorefractive keratectomy; LASIK laser assisted in situ keratomileusis; HTN hypertension; DM diabetes mellitus; COPD chronic obstructive pulmonary disease

## 2021-07-23 ENCOUNTER — Encounter (INDEPENDENT_AMBULATORY_CARE_PROVIDER_SITE_OTHER): Payer: Medicare Other | Admitting: Ophthalmology

## 2021-07-23 DIAGNOSIS — H35033 Hypertensive retinopathy, bilateral: Secondary | ICD-10-CM

## 2021-07-23 DIAGNOSIS — I10 Essential (primary) hypertension: Secondary | ICD-10-CM

## 2021-07-23 DIAGNOSIS — H26492 Other secondary cataract, left eye: Secondary | ICD-10-CM

## 2021-07-23 DIAGNOSIS — Z961 Presence of intraocular lens: Secondary | ICD-10-CM

## 2021-07-23 DIAGNOSIS — H40002 Preglaucoma, unspecified, left eye: Secondary | ICD-10-CM

## 2021-07-23 DIAGNOSIS — H43812 Vitreous degeneration, left eye: Secondary | ICD-10-CM

## 2021-07-23 DIAGNOSIS — H34832 Tributary (branch) retinal vein occlusion, left eye, with macular edema: Secondary | ICD-10-CM

## 2021-08-02 NOTE — Progress Notes (Signed)
[facility] Note  08/05/2021     CHIEF COMPLAINT Patient presents for Retina Follow Up  HISTORY OF PRESENT ILLNESS: Martha Richard is a 88 y.o. female who presents to the clinic today for:   HPI     Retina Follow Up   Patient presents with  CRVO/BRVO (IVA OS #13 (03.27.23)).  In left eye.  This started years ago.  Duration of 10 weeks.  Since onset it is stable.  I, the attending physician,  performed the HPI with the patient and updated documentation appropriately.        Comments   Patient feels that the bifocal line in her glasses is to high. She has not noticed any changes in her vision.       Last edited by Bernarda Caffey, MD on 08/06/2021  4:09 PM.     Pt states vision is better, she is delayed to 11 weeks from 10 weeks due to not having a sitter for her husband.   Referring physician: Manon Hilding, MD [place],  [place] [address]  HISTORICAL INFORMATION:   Selected notes from the MEDICAL RECORD NUMBER Referred by Dr. Read Drivers for concern of cystic IRF OS    CURRENT MEDICATIONS: Current Outpatient Medications (Ophthalmic Drugs)  Medication Sig   hydroxypropyl methylcellulose / hypromellose (ISOPTO TEARS / GONIOVISC) 2.5 % ophthalmic solution Place 1 drop into both eyes 3 (three) times daily as needed for dry eyes.   No current facility-administered medications for this visit. (Ophthalmic Drugs)   Current Outpatient Medications (Other)  Medication Sig   hydrochlorothiazide (HYDRODIURIL) 12.5 MG tablet Take 12.5 mg by mouth daily as needed (salt intake).   Current Facility-Administered Medications (Other)  Medication Route   Bevacizumab (AVASTIN) SOLN 1.25 mg Intravitreal   Bevacizumab (AVASTIN) SOLN 1.25 mg Intravitreal   Bevacizumab (AVASTIN) SOLN 1.25 mg Intravitreal   Bevacizumab (AVASTIN) SOLN 1.25 mg Intravitreal   Bevacizumab (AVASTIN) SOLN 1.25 mg Intravitreal   Bevacizumab (AVASTIN) SOLN 1.25 mg Intravitreal    REVIEW OF SYSTEMS: ROS   Positive for: Musculoskeletal, Cardiovascular, Eyes Negative for: Constitutional, Gastrointestinal, Neurological, Skin, Genitourinary, HENT, Endocrine, Respiratory, Psychiatric, Allergic/Imm, Heme/Lymph Last edited by Annie Paras, COT on 08/05/2021  1:32 PM.     ALLERGIES Allergies  Allergen Reactions   Ace Inhibitors Cough   Caffeine Other (See Comments)    "shoots my BP up"   Nsaids Other (See Comments)    GI upset     Peanut-Containing Drug Products Other (See Comments)    headaches   Ultram [Tramadol Hcl] Other (See Comments)    Headaches. Patient is unsure of this.   Penicillins Itching and Rash    Has patient had a PCN reaction causing immediate rash, facial/tongue/throat swelling, SOB or lightheadedness with hypotension: No Has patient had a PCN reaction causing severe rash involving mucus membranes or skin necrosis: No Has patient had a PCN reaction that required hospitalization No Has patient had a PCN reaction occurring within the last 10 years: No If all of the above answers are "NO", then may proceed with Cephalosporin use.  Tolerated Cephalosporin Date: 01/03/20.     PAST MEDICAL HISTORY Past Medical History:  Diagnosis Date   Aortic stenosis    Arrhythmia    palpitations   Arthritis    Cancer (HCC)    squamous cell Left hand   Complication of anesthesia    slow to wake up   Depression  Lost a son to cancer   Headache    hx of migraines   Hypertension    Hypertensive retinopathy    OU   Left bundle branch block    Past Surgical History:  Procedure Laterality Date   APPENDECTOMY     CATARACT EXTRACTION Bilateral    OU about 10 yrs ago   COLONOSCOPY WITH PROPOFOL N/A 05/01/2020   Surgeon: Eloise Harman, DO;Nonbleeding internal hemorrhoids, pancolonic diverticulosis, 1 7 mm tubular adenoma resected, single nonbleeding colonic angioectasia treated with APC.  No recommendations to repeat colonoscopy due to  age.   EYE SURGERY     laser   fibroadenoma left breast removal     HOT HEMOSTASIS  05/01/2020   Procedure: HOT HEMOSTASIS (ARGON PLASMA COAGULATION/BICAP);  Surgeon: Eloise Harman, DO;  Location: [facility];  Service: Endoscopy;;   OTHER SURGICAL HISTORY     appendectomy   POLYPECTOMY  05/01/2020   Procedure: POLYPECTOMY;  Surgeon: Eloise Harman, DO;  Location: [facility];  Service: Endoscopy;;   TOTAL HIP ARTHROPLASTY Left 06/18/2016   Procedure: LEFT TOTAL HIP ARTHROPLASTY ANTERIOR APPROACH;  Surgeon: Gaynelle Arabian, MD;  Location: [facility];  Service: Orthopedics;  Laterality: Left;   TOTAL HIP ARTHROPLASTY Right 05/05/2018   Procedure: TOTAL HIP ARTHROPLASTY ANTERIOR APPROACH;  Surgeon: Gaynelle Arabian, MD;  Location: [facility];  Service: Orthopedics;  Laterality: Right;   TOTAL KNEE ARTHROPLASTY Right 01/02/2020   Procedure: TOTAL KNEE ARTHROPLASTY;  Surgeon: Gaynelle Arabian, MD;  Location: [facility];  Service: Orthopedics;  Laterality: Right;  7mn   UTERINE FIBROID SURGERY     FAMILY HISTORY Family History  Problem Relation Age of Onset   Heart disease Mother    Heart attack Mother    Heart failure Mother    Stroke Father    Heart attack Maternal Aunt    Heart disease Maternal Aunt    Heart failure Maternal Aunt    Amblyopia Neg Hx    Blindness Neg Hx    Cataracts Neg Hx    Glaucoma Neg Hx    Macular degeneration Neg Hx    Retinal detachment Neg Hx    Strabismus Neg Hx    Retinitis pigmentosa Neg Hx    Colon cancer Neg Hx    SOCIAL HISTORY Social History   Tobacco Use   Smoking status: Former    Packs/day: 0.25    Years: 3.00    Total pack years: 0.75    Types: Cigarettes    Quit date: 04/27/1964    Years since quitting: 57.3   Smokeless tobacco: Never   Tobacco comments:    years ago 50 plus years  Vaping Use   Vaping Use: Never used  Substance Use Topics   Alcohol use: No   Drug use: No       OPHTHALMIC EXAM: Base Eye Exam     Visual Acuity  (Snellen - Linear)       Right Left   Dist cc 20/25 20/50   Dist ph cc  20/40    Correction: Glasses         Tonometry (Tonopen, 2:23 PM)       Right Left   Pressure 18 19         Pupils       Dark Light Shape React APD   Right 2 1 Round Brisk None   Left 2 1 Round Brisk None         Visual Fields  Left Right    Full Full         Extraocular Movement       Right Left    Full, Ortho Full, Ortho         Neuro/Psych     Oriented x3: Yes   Mood/Affect: Normal         Dilation     Both eyes: 2.5% Phenylephrine, 1.0% Mydriacyl @ 1:33 PM           Slit Lamp and Fundus Exam     Slit Lamp Exam       Right Left   Lids/Lashes Dermatochalasis - upper lid, Meibomian gland dysfunction, Scurf Dermatochalasis - upper lid, Meibomian gland dysfunction   Conjunctiva/Sclera White and quiet White and quiet   Cornea Arcus, 2+ Punctate epithelial erosions, Debris in tear film, Well healed cataract wounds, nasal and temporal LRI Mild arcus, 2+ Punctate epithelial erosions, nasal and temporal LRI, Debris in tear film   Anterior Chamber Deep and quiet Deep and quiet   Iris Round and moderately dilated Round and moderately dilated    Lens PC IOL in good position with open PC PC IOL in good position, open PC   Anterior Vitreous Vitreous syneresis Vitreous syneresis, Posterior vitreous detachment         Fundus Exam       Right Left   Disc Disc heme and focal edema at 0430 - persistent, Pink and Sharp Tilted, +collateral vessels temporally, vascular loops, inferior rim thinning, temporal pallor, Sharp rim, +cupping   C/D Ratio 0.5 0.7   Macula Good foveal reflex, Retinal pigment epithelial mottling, trace cystic changes - stably improved, No heme or edema good foveal reflex; central cystic changes - stably improved, fine exudate superior macula -- improved, trace ERM, no heme   Vessels Inferior vessels 3+tortuosity, Vascular attenuation, Impending BRVO  inferiorly, dilated venuels (inferior>superior), AV crossing changes, Copper wiring Vascular attenuation, Tortuous, AV crossing changes, dilated venules, Copper wiring   Periphery Attached, scattered MAs Attached, scattered MAs           Refraction     Wearing Rx       Sphere Cylinder Axis Add   Right +1.50 +1.50 014 +2.50   Left +2.00 +1.00 178 +2.50           IMAGING AND PROCEDURES  Imaging and Procedures for 05/25/17  OCT, Retina - OU - Both Eyes       Right Eye Quality was borderline. Central Foveal Thickness: 267. Progression has been stable. Findings include normal foveal contour, no IRF, no SRF, epiretinal membrane (stable improvement in IRF ).   Left Eye Quality was good. Central Foveal Thickness: 251. Progression has been stable. Findings include normal foveal contour, no IRF, no SRF, intraretinal hyper-reflective material, epiretinal membrane (Stable improvement in IRF/cystic changes temporal fovea ).   Notes *Images captured and stored on drive  Diagnosis / Impression:  OD: stable improvement in IRF  OS: BRVO w/ CME OS -- stable improvement in IRF/cystic changes temporal fovea   Clinical management:  See below  Abbreviations: NFP - Normal foveal profile. CME - cystoid macular edema. PED - pigment epithelial detachment. IRF - intraretinal fluid. SRF - subretinal fluid. EZ - ellipsoid zone. ERM - epiretinal membrane. ORA - outer retinal atrophy. ORT - outer retinal tubulation. SRHM - subretinal hyper-reflective material       Intravitreal Injection, Pharmacologic Agent - OS - Left Eye       Time Out 08/05/2021. 2:25  PM. Confirmed correct patient, procedure, site, and patient consented.   Anesthesia Topical anesthesia was used. Anesthetic medications included Lidocaine 2%, Proparacaine 0.5%.   Procedure Preparation included 5% betadine to ocular surface, eyelid speculum. A (32g) needle was used.   Injection: 1.25 mg Bevacizumab 1.'25mg'$ /0.68m    Route: Intravitreal, Site: Left Eye   NDC: [id number] Lot:: [id number] Expiration date: 10/03/2021   Post-op Post injection exam found visual acuity of at least counting fingers. The patient tolerated the procedure well. There were no complications. The patient received written and verbal post procedure care education. Post injection medications were not given.            ASSESSMENT/PLAN:    ICD-10-CM   1. Branch retinal vein occlusion of left eye with macular edema  H34.8320 OCT, Retina - OU - Both Eyes    Intravitreal Injection, Pharmacologic Agent - OS - Left Eye    Bevacizumab (AVASTIN) SOLN 1.25 mg    2. Essential hypertension  I10     3. Hypertensive retinopathy of both eyes  H35.033     4. Posterior vitreous detachment of left eye  H43.812     5. 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 Pseudophakia of both eyes  Z96.1     7. PCO (posterior capsular opacification), left  H26.492      1. BRVO w/ macular edema OS - delayed to 11 weeks from 10 weeks due to not having a sitter for her husband  - delayed follow up from 4-5 weeks to 244 months(from Jan 2021 to Nov 2022)  - on presenting exam, superior temporal venule was dilated and tortuous but no significant intraretinal hemorrhages suggesting perhaps a remote BRVO event had occurred  - FA at presentation (01.23.19) shows significant leakage from branches from the superior temporal venules corresponding to central cystic changes noted on OCT  - S/P IVA OS #1 (01.23.19), #2 (02.25.19), #3 (04.01.19), #4 (05.07.19), #5 (06.25.19), #6 (02.21.20), #7 (09.16.20), #8 (10.21.20), #9 (12.08.20), #10 (01.22.21), #11 (11.21.22), #12 (01.23.23), #13 (03.27.23)  - lost to follow up from February to Sept 2020 and from January 2021 to November 2022  - OCT today shows stable improvement in IRF temporal fovea and macula at 11 weeks  - BCVA OS stable at 20/40 from 20/30  - recommend IVA OS #14 today (06.12.23) with follow up extended to 13  weeks  - RBA of procedure discussed, questions answered  - informed consent for IVA OS obtained and signed today, 11.21.22  - see procedure note  - F/U 13 weeks -- DFE/OCT/possible injection  2,3. Hypertensive retinopathy OU  - discussed importance of tight BP control  - monitor  4. PVD OS  - Discussed findings and prognosis  - No RT or RD on 360 peripheral exam  - Reviewed s/s of RT/RD  - strict return precautions for any such RT/RD symptoms  5. Glaucoma Suspect OS  - under the expert management of Dr. WKathlen Mody 6,7. Pseudophakia OU  - [facility] s/p YAG capsulotomy OU  - OS re-YAG'd by Dr. WKathlen Mody-- PC opening improved  - monitor  Ophthalmic Meds Ordered this visit:  Meds ordered this encounter  Medications   Bevacizumab (AVASTIN) SOLN 1.25 mg     Return in about 13 weeks (around 11/04/2021) for DFE, OCT, possible injection.  There are no Patient Instructions on file for this visit.   Explained the diagnoses,  plan, and follow up with the patient and they expressed understanding.  Patient expressed understanding of the importance of proper follow up care.   This document serves as a record of services personally performed by Gardiner Sleeper, MD, PhD. It was created on their behalf by Roselee Nova, COMT. The creation of this record is the provider's dictation and/or activities during the visit.  Electronically signed by: Roselee Nova, COMT 08/06/21 4:43 PM  This document serves as a record of services personally performed by Gardiner Sleeper, MD, PhD. It was created on their behalf by Leonie Douglas, an ophthalmic technician. The creation of this record is the provider's dictation and/or activities during the visit.    Electronically signed by: Leonie Douglas COA, 08/06/21  4:43 PM  Gardiner Sleeper, M.D., Ph.D. Diseases & Surgery of the Retina and Vitreous [facility]  I have reviewed the above documentation for  accuracy and completeness, and I agree with the above. Gardiner Sleeper, M.D., Ph.D. 08/06/21 4:48 PM    Abbreviations: M myopia (nearsighted); A astigmatism; H hyperopia (farsighted); P presbyopia; Mrx spectacle prescription;  CTL contact lenses; OD right eye; OS left eye; OU both eyes  XT exotropia; ET esotropia; PEK punctate epithelial keratitis; PEE punctate epithelial erosions; DES dry eye syndrome; MGD meibomian gland dysfunction; ATs artificial tears; PFAT's preservative free artificial tears; [place] nuclear sclerotic cataract; PSC posterior subcapsular cataract; ERM epi-retinal membrane; PVD posterior vitreous detachment; RD retinal detachment; DM diabetes mellitus; DR diabetic retinopathy; NPDR non-proliferative diabetic retinopathy; PDR proliferative diabetic retinopathy; CSME clinically significant macular edema; DME diabetic macular edema; dbh dot blot hemorrhages; CWS cotton wool spot; POAG primary open angle glaucoma; C/D cup-to-disc ratio; HVF humphrey visual field; GVF goldmann visual field; OCT optical coherence tomography; IOP intraocular pressure; BRVO Branch retinal vein occlusion; CRVO central retinal vein occlusion; CRAO central retinal artery occlusion; BRAO branch retinal artery occlusion; RT retinal tear; SB scleral buckle; PPV pars plana vitrectomy; VH Vitreous hemorrhage; PRP panretinal laser photocoagulation; IVK intravitreal kenalog; VMT vitreomacular traction; MH Macular hole;  NVD neovascularization of the disc; NVE neovascularization elsewhere; AREDS age related eye disease study; ARMD age related macular degeneration; POAG primary open angle glaucoma; EBMD epithelial/anterior basement membrane dystrophy; ACIOL anterior chamber intraocular lens; IOL intraocular lens; PCIOL posterior chamber intraocular lens; Phaco/IOL phacoemulsification with intraocular lens placement; [place] photorefractive keratectomy; LASIK laser assisted in situ keratomileusis; HTN hypertension; DM diabetes  mellitus; COPD chronic obstructive pulmonary disease

## 2021-08-05 ENCOUNTER — Encounter (INDEPENDENT_AMBULATORY_CARE_PROVIDER_SITE_OTHER): Payer: Self-pay | Admitting: Ophthalmology

## 2021-08-05 ENCOUNTER — Ambulatory Visit (INDEPENDENT_AMBULATORY_CARE_PROVIDER_SITE_OTHER): Payer: Medicare Other | Admitting: Ophthalmology

## 2021-08-05 DIAGNOSIS — H35033 Hypertensive retinopathy, bilateral: Secondary | ICD-10-CM

## 2021-08-05 DIAGNOSIS — H34832 Tributary (branch) retinal vein occlusion, left eye, with macular edema: Secondary | ICD-10-CM

## 2021-08-05 DIAGNOSIS — I10 Essential (primary) hypertension: Secondary | ICD-10-CM

## 2021-08-05 DIAGNOSIS — Z961 Presence of intraocular lens: Secondary | ICD-10-CM | POA: Diagnosis not present

## 2021-08-05 DIAGNOSIS — H40002 Preglaucoma, unspecified, left eye: Secondary | ICD-10-CM

## 2021-08-05 DIAGNOSIS — H43812 Vitreous degeneration, left eye: Secondary | ICD-10-CM | POA: Diagnosis not present

## 2021-08-05 DIAGNOSIS — H26492 Other secondary cataract, left eye: Secondary | ICD-10-CM

## 2021-08-06 ENCOUNTER — Encounter (INDEPENDENT_AMBULATORY_CARE_PROVIDER_SITE_OTHER): Payer: Self-pay | Admitting: Ophthalmology

## 2021-08-06 MED ORDER — BEVACIZUMAB CHEMO INJECTION 1.25MG/0.05ML SYRINGE FOR KALEIDOSCOPE
1.2500 mg | INTRAVITREAL | Status: AC | PRN
  Administered 2021-08-06: 1.25 mg via INTRAVITREAL

## 2021-08-20 DIAGNOSIS — S80869A Insect bite (nonvenomous), unspecified lower leg, initial encounter: Secondary | ICD-10-CM | POA: Diagnosis not present

## 2021-09-26 DIAGNOSIS — R7301 Impaired fasting glucose: Secondary | ICD-10-CM | POA: Diagnosis not present

## 2021-09-26 DIAGNOSIS — I10 Essential (primary) hypertension: Secondary | ICD-10-CM | POA: Diagnosis not present

## 2021-09-26 DIAGNOSIS — K2100 Gastro-esophageal reflux disease with esophagitis, without bleeding: Secondary | ICD-10-CM | POA: Diagnosis not present

## 2021-09-26 DIAGNOSIS — E7849 Other hyperlipidemia: Secondary | ICD-10-CM | POA: Diagnosis not present

## 2021-10-03 DIAGNOSIS — R011 Cardiac murmur, unspecified: Secondary | ICD-10-CM | POA: Diagnosis not present

## 2021-10-03 DIAGNOSIS — E7849 Other hyperlipidemia: Secondary | ICD-10-CM | POA: Diagnosis not present

## 2021-10-03 DIAGNOSIS — K2100 Gastro-esophageal reflux disease with esophagitis, without bleeding: Secondary | ICD-10-CM | POA: Diagnosis not present

## 2021-10-03 DIAGNOSIS — Z6824 Body mass index (BMI) 24.0-24.9, adult: Secondary | ICD-10-CM | POA: Diagnosis not present

## 2021-10-03 DIAGNOSIS — I10 Essential (primary) hypertension: Secondary | ICD-10-CM | POA: Diagnosis not present

## 2021-10-03 DIAGNOSIS — D696 Thrombocytopenia, unspecified: Secondary | ICD-10-CM | POA: Diagnosis not present

## 2021-10-03 DIAGNOSIS — D649 Anemia, unspecified: Secondary | ICD-10-CM | POA: Diagnosis not present

## 2021-10-03 DIAGNOSIS — R7301 Impaired fasting glucose: Secondary | ICD-10-CM | POA: Diagnosis not present

## 2021-10-03 DIAGNOSIS — Z0001 Encounter for general adult medical examination with abnormal findings: Secondary | ICD-10-CM | POA: Diagnosis not present

## 2021-10-08 ENCOUNTER — Ambulatory Visit: Payer: Medicare Other | Admitting: Cardiology

## 2021-10-08 NOTE — Progress Notes (Deleted)
Clinical Summary Ms. Tinner is a 88 y.o.female  1.Aortic stenosis 05/2019 echo: LVEF 55-60%, grade I dd, mod MR, mod AS mean grad 24 AVA VTI 1.25  - no SOB, DOE. Mild LE edema at times.   04/2021 echo: LVEF 55-60%, no WMAs, grade I dd. Mod AS mean grad 28, AVA VTI 1.02    2. Chronic LBBB     3. Mitral regurgitation - mod by 05/2019 echo Past Medical History:  Diagnosis Date   Aortic stenosis    Arrhythmia    palpitations   Arthritis    Cancer (HCC)    squamous cell Left hand   Complication of anesthesia    slow to wake up   Depression    Lost a son to cancer   Headache    hx of migraines   Hypertension    Hypertensive retinopathy    OU   Left bundle Daissy Yerian block      Allergies  Allergen Reactions   Ace Inhibitors Cough   Caffeine Other (See Comments)    "shoots my BP up"   Nsaids Other (See Comments)    GI upset     Peanut-Containing Drug Products Other (See Comments)    headaches   Ultram [Tramadol Hcl] Other (See Comments)    Headaches. Patient is unsure of this.   Penicillins Itching and Rash    Has patient had a PCN reaction causing immediate rash, facial/tongue/throat swelling, SOB or lightheadedness with hypotension: No Has patient had a PCN reaction causing severe rash involving mucus membranes or skin necrosis: No Has patient had a PCN reaction that required hospitalization No Has patient had a PCN reaction occurring within the last 10 years: No If all of the above answers are "NO", then may proceed with Cephalosporin use.  Tolerated Cephalosporin Date: 01/03/20.       Current Outpatient Medications  Medication Sig Dispense Refill   hydrochlorothiazide (HYDRODIURIL) 12.5 MG tablet Take 12.5 mg by mouth daily as needed (salt intake).     hydroxypropyl methylcellulose / hypromellose (ISOPTO TEARS / GONIOVISC) 2.5 % ophthalmic solution Place 1 drop into both eyes 3 (three) times daily as needed for dry eyes.     Current Facility-Administered  Medications  Medication Dose Route Frequency Provider Last Rate Last Admin   Bevacizumab (AVASTIN) SOLN 1.25 mg  1.25 mg Intravitreal  Bernarda Caffey, MD   1.25 mg at 04/20/17 1138   Bevacizumab (AVASTIN) SOLN 1.25 mg  1.25 mg Intravitreal  Bernarda Caffey, MD   1.25 mg at 04/20/17 1455   Bevacizumab (AVASTIN) SOLN 1.25 mg  1.25 mg Intravitreal  Bernarda Caffey, MD   1.25 mg at 05/25/17 1345   Bevacizumab (AVASTIN) SOLN 1.25 mg  1.25 mg Intravitreal  Bernarda Caffey, MD   1.25 mg at 06/30/17 1351   Bevacizumab (AVASTIN) SOLN 1.25 mg  1.25 mg Intravitreal  Bernarda Caffey, MD   1.25 mg at 08/23/17 0034   Bevacizumab (AVASTIN) SOLN 1.25 mg  1.25 mg Intravitreal  Bernarda Caffey, MD   1.25 mg at 04/16/18 1349     Past Surgical History:  Procedure Laterality Date   APPENDECTOMY     CATARACT EXTRACTION Bilateral    OU about 10 yrs ago   COLONOSCOPY WITH PROPOFOL N/A 05/01/2020   Surgeon: Eloise Harman, DO;Nonbleeding internal hemorrhoids, pancolonic diverticulosis, 1 7 mm tubular adenoma resected, single nonbleeding colonic angioectasia treated with APC.  No recommendations to repeat colonoscopy due to age.   EYE SURGERY  laser   fibroadenoma left breast removal     HOT HEMOSTASIS  05/01/2020   Procedure: HOT HEMOSTASIS (ARGON PLASMA COAGULATION/BICAP);  Surgeon: Eloise Harman, DO;  Location: [facility];  Service: Endoscopy;;   OTHER SURGICAL HISTORY     appendectomy   POLYPECTOMY  05/01/2020   Procedure: POLYPECTOMY;  Surgeon: Eloise Harman, DO;  Location: [facility];  Service: Endoscopy;;   TOTAL HIP ARTHROPLASTY Left 06/18/2016   Procedure: LEFT TOTAL HIP ARTHROPLASTY ANTERIOR APPROACH;  Surgeon: Gaynelle Arabian, MD;  Location: [facility];  Service: Orthopedics;  Laterality: Left;   TOTAL HIP ARTHROPLASTY Right 05/05/2018   Procedure: TOTAL HIP ARTHROPLASTY ANTERIOR APPROACH;  Surgeon: Gaynelle Arabian, MD;  Location: [facility];  Service: Orthopedics;  Laterality: Right;   TOTAL KNEE  ARTHROPLASTY Right 01/02/2020   Procedure: TOTAL KNEE ARTHROPLASTY;  Surgeon: Gaynelle Arabian, MD;  Location: [facility];  Service: Orthopedics;  Laterality: Right;  65mn   UTERINE FIBROID SURGERY       Allergies  Allergen Reactions   Ace Inhibitors Cough   Caffeine Other (See Comments)    "shoots my BP up"   Nsaids Other (See Comments)    GI upset     Peanut-Containing Drug Products Other (See Comments)    headaches   Ultram [Tramadol Hcl] Other (See Comments)    Headaches. Patient is unsure of this.   Penicillins Itching and Rash    Has patient had a PCN reaction causing immediate rash, facial/tongue/throat swelling, SOB or lightheadedness with hypotension: No Has patient had a PCN reaction causing severe rash involving mucus membranes or skin necrosis: No Has patient had a PCN reaction that required hospitalization No Has patient had a PCN reaction occurring within the last 10 years: No If all of the above answers are "NO", then may proceed with Cephalosporin use.  Tolerated Cephalosporin Date: 01/03/20.        Family History  Problem Relation Age of Onset   Heart disease Mother    Heart attack Mother    Heart failure Mother    Stroke Father    Heart attack Maternal Aunt    Heart disease Maternal Aunt    Heart failure Maternal Aunt    Amblyopia Neg Hx    Blindness Neg Hx    Cataracts Neg Hx    Glaucoma Neg Hx    Macular degeneration Neg Hx    Retinal detachment Neg Hx    Strabismus Neg Hx    Retinitis pigmentosa Neg Hx    Colon cancer Neg Hx      Social History Ms. PDoroughreports that she quit smoking about 57 years ago. Her smoking use included cigarettes. She has a 0.75 pack-year smoking history. She has never used smokeless tobacco. Ms. PPenmanreports no history of alcohol use.   Review of Systems CONSTITUTIONAL: No weight loss, fever, chills, weakness or fatigue.  HEENT: Eyes: No visual loss, blurred vision, double vision or yellow sclerae.No hearing loss,  sneezing, congestion, runny nose or sore throat.  SKIN: No rash or itching.  CARDIOVASCULAR:  RESPIRATORY: No shortness of breath, cough or sputum.  GASTROINTESTINAL: No anorexia, nausea, vomiting or diarrhea. No abdominal pain or blood.  GENITOURINARY: No burning on urination, no polyuria NEUROLOGICAL: No headache, dizziness, syncope, paralysis, ataxia, numbness or tingling in the extremities. No change in bowel or bladder control.  MUSCULOSKELETAL: No muscle, back pain, joint pain or stiffness.  LYMPHATICS: No enlarged nodes. No history of splenectomy.  PSYCHIATRIC: No history of  depression or anxiety.  ENDOCRINOLOGIC: No reports of sweating, cold or heat intolerance. No polyuria or polydipsia.  Marland Kitchen   Physical Examination There were no vitals filed for this visit. There were no vitals filed for this visit.  Gen: resting comfortably, no acute distress HEENT: no scleral icterus, pupils equal round and reactive, no palptable cervical adenopathy,  CV Resp: Clear to auscultation bilaterally GI: abdomen is soft, non-tender, non-distended, normal bowel sounds, no hepatosplenomegaly MSK: extremities are warm, no edema.  Skin: warm, no rash Neuro:  no focal deficits Psych: appropriate affect   Diagnostic Studies   Echocardiogram 04/21/2018:   1. The left ventricle has normal systolic function with an ejection  fraction of 60-65%. The cavity size was normal. There is mildly increased  left ventricular wall thickness. Left ventricular diastolic Doppler  parameters are consistent with impaired  relaxation Elevated mean left atrial pressure.   2. The right ventricle has normal systolic function. The cavity was  normal. There is no increase in right ventricular wall thickness.   3. Left atrial size was mildly dilated.   4. The pericardial effusion is circumferential.   5. Trivial pericardial effusion is present.   6. The aortic valve has an indeterminant number of cusps Moderate   thickening of the aortic valve Moderate calcification of the aortic valve.  Aortic valve regurgitation is mild by color flow Doppler. moderate  stenosis of the aortic valve. Moderate aortic   annular calcification noted.   7. The mitral valve is normal in structure. Mild thickening of the mitral  valve leaflet. Mild calcification of the mitral valve leaflet. There is  mild mitral annular calcification present. No evidence of mitral valve  stenosis.   8. The tricuspid valve is normal in structure.   9. The aortic root is normal in size and structure.  10. Pulmonary hypertension is normal, PASP is 24.      She underwent a normal nuclear stress test on 05/23/16.     05/2019 echo IMPRESSIONS     1. Left ventricular ejection fraction, by estimation, is 55 to 60%. The  left ventricle has normal function. The left ventricle has no regional  wall motion abnormalities. Left ventricular diastolic parameters are  consistent with Grade I diastolic  dysfunction (impaired relaxation). Elevated left ventricular end-diastolic  pressure.   2. Right ventricular systolic function is low normal. The right  ventricular size is normal. Mildly increased right ventricular wall  thickness. There is moderately elevated pulmonary artery systolic  pressure.   3. Left atrial size was mildly dilated.   4. Small circumferential pericardial effusion without hemodynamic  compromise. The pericardial effusion is circumferential. There is no  evidence of cardiac tamponade.   5. The mitral valve is degenerative. Moderate mitral valve regurgitation.   6. Tricuspid valve regurgitation is mild to moderate.   7. The aortic valve is tricuspid. Aortic valve regurgitation is mild.  Moderate aortic valve stenosis. Aortic valve area, by VTI measures 1.25  cm. Aortic valve mean gradient measures 24.2 mmHg. Aortic valve Vmax  measures 3.21 m/s.   8. The inferior vena cava is normal in size with greater than 50%   respiratory variability, suggesting right atrial pressure of 3 mmHg.     04/2021 echo 1. Left ventricular ejection fraction, by estimation, is 55 to 60%. The  left ventricle has normal function. The left ventricle has no regional  wall motion abnormalities. There is moderate left ventricular hypertrophy.  Left ventricular diastolic  parameters are consistent with  Grade I diastolic dysfunction (impaired  relaxation). Elevated left atrial pressure.   2. Right ventricular systolic function is normal. The right ventricular  size is normal.   3. Left atrial size was mildly dilated.   4. There is evidence of partial RA collapse however no evidence of RV  collapse and the IVC is normal which would overall exclude tamponade  physiology. Marland Kitchen a small pericardial effusion is present. The pericardial  effusion is circumferential.   5. The mitral valve is abnormal. Mild mitral valve regurgitation. No  evidence of mitral stenosis.   6. The aortic valve is tricuspid. There is severe calcifcation of the  aortic valve. There is severe thickening of the aortic valve. Aortic valve  regurgitation is mild. Moderate aortic valve stenosis. Aortic valve mean  gradient measures 28.2 mmHg. Aortic   valve peak gradient measures 42.6 mmHg. Aortic valve area, by VTI  measures 1.02 cm.   7. The inferior vena cava is normal in size with greater than 50%  respiratory variability, suggesting right atrial pressure of 3 mmHg.   Assessment and Plan  1.Valvular heart disease - prior echo with mod AS and mod MR - will repeat echo for ongoing sureveillance   2. Carotid bruits - obtain carotid US      Arnoldo Lenis, M.D.

## 2021-10-31 NOTE — Progress Notes (Signed)
[facility] Note  11/04/2021     CHIEF COMPLAINT Patient presents for Retina Follow Up  HISTORY OF PRESENT ILLNESS: Martha Richard is a 88 y.o. female who presents to the clinic today for:   HPI     Retina Follow Up   Patient presents with  CRVO/BRVO.  In left eye.  This started years ago.  Duration of 10 weeks.  Since onset it is stable.  I, the attending physician,  performed the HPI with the patient and updated documentation appropriately.        Comments   Patient feels that the line in the glasses is to high.       Last edited by Bernarda Caffey, MD on 11/04/2021 12:53 PM.    Pt states    Referring physician: Manon Hilding, MD [place],  [place] [address]  HISTORICAL INFORMATION:   Selected notes from the MEDICAL RECORD NUMBER Referred by Dr. Read Drivers for concern of cystic IRF OS    CURRENT MEDICATIONS: Current Outpatient Medications (Ophthalmic Drugs)  Medication Sig   hydroxypropyl methylcellulose / hypromellose (ISOPTO TEARS / GONIOVISC) 2.5 % ophthalmic solution Place 1 drop into both eyes 3 (three) times daily as needed for dry eyes.   No current facility-administered medications for this visit. (Ophthalmic Drugs)   Current Outpatient Medications (Other)  Medication Sig   hydrochlorothiazide (HYDRODIURIL) 12.5 MG tablet Take 12.5 mg by mouth daily as needed (salt intake).   Current Facility-Administered Medications (Other)  Medication Route   Bevacizumab (AVASTIN) SOLN 1.25 mg Intravitreal   Bevacizumab (AVASTIN) SOLN 1.25 mg Intravitreal   Bevacizumab (AVASTIN) SOLN 1.25 mg Intravitreal   Bevacizumab (AVASTIN) SOLN 1.25 mg Intravitreal   Bevacizumab (AVASTIN) SOLN 1.25 mg Intravitreal   Bevacizumab (AVASTIN) SOLN 1.25 mg Intravitreal   REVIEW OF SYSTEMS: ROS   Positive for: Musculoskeletal, Cardiovascular, Eyes Negative for: Constitutional, Gastrointestinal, Neurological, Skin, Genitourinary, HENT, Endocrine,  Respiratory, Psychiatric, Allergic/Imm, Heme/Lymph Last edited by Annie Paras, COT on 11/04/2021 10:08 AM.     ALLERGIES Allergies  Allergen Reactions   Ace Inhibitors Cough   Caffeine Other (See Comments)    "shoots my BP up"   Nsaids Other (See Comments)    GI upset     Peanut-Containing Drug Products Other (See Comments)    headaches   Ultram [Tramadol Hcl] Other (See Comments)    Headaches. Patient is unsure of this.   Penicillins Itching and Rash    Has patient had a PCN reaction causing immediate rash, facial/tongue/throat swelling, SOB or lightheadedness with hypotension: No Has patient had a PCN reaction causing severe rash involving mucus membranes or skin necrosis: No Has patient had a PCN reaction that required hospitalization No Has patient had a PCN reaction occurring within the last 10 years: No If all of the above answers are "NO", then may proceed with Cephalosporin use.  Tolerated Cephalosporin Date: 01/03/20.     PAST MEDICAL HISTORY Past Medical History:  Diagnosis Date   Aortic stenosis    Arrhythmia    palpitations   Arthritis    Cancer (HCC)    squamous cell Left hand   Complication of anesthesia    slow to wake up   Depression    Lost a son to cancer   Headache    hx of migraines   Hypertension    Hypertensive retinopathy    OU   Left bundle branch block    Past Surgical  History:  Procedure Laterality Date   APPENDECTOMY     CATARACT EXTRACTION Bilateral    OU about 10 yrs ago   COLONOSCOPY WITH PROPOFOL N/A 05/01/2020   Surgeon: Eloise Harman, DO;Nonbleeding internal hemorrhoids, pancolonic diverticulosis, 1 7 mm tubular adenoma resected, single nonbleeding colonic angioectasia treated with APC.  No recommendations to repeat colonoscopy due to age.   EYE SURGERY     laser   fibroadenoma left breast removal     HOT HEMOSTASIS  05/01/2020   Procedure: HOT HEMOSTASIS (ARGON PLASMA COAGULATION/BICAP);  Surgeon: Eloise Harman, DO;  Location: [facility];  Service: Endoscopy;;   OTHER SURGICAL HISTORY     appendectomy   POLYPECTOMY  05/01/2020   Procedure: POLYPECTOMY;  Surgeon: Eloise Harman, DO;  Location: [facility];  Service: Endoscopy;;   TOTAL HIP ARTHROPLASTY Left 06/18/2016   Procedure: LEFT TOTAL HIP ARTHROPLASTY ANTERIOR APPROACH;  Surgeon: Gaynelle Arabian, MD;  Location: [facility];  Service: Orthopedics;  Laterality: Left;   TOTAL HIP ARTHROPLASTY Right 05/05/2018   Procedure: TOTAL HIP ARTHROPLASTY ANTERIOR APPROACH;  Surgeon: Gaynelle Arabian, MD;  Location: [facility];  Service: Orthopedics;  Laterality: Right;   TOTAL KNEE ARTHROPLASTY Right 01/02/2020   Procedure: TOTAL KNEE ARTHROPLASTY;  Surgeon: Gaynelle Arabian, MD;  Location: [facility];  Service: Orthopedics;  Laterality: Right;  39mn   UTERINE FIBROID SURGERY     FAMILY HISTORY Family History  Problem Relation Age of Onset   Heart disease Mother    Heart attack Mother    Heart failure Mother    Stroke Father    Heart attack Maternal Aunt    Heart disease Maternal Aunt    Heart failure Maternal Aunt    Amblyopia Neg Hx    Blindness Neg Hx    Cataracts Neg Hx    Glaucoma Neg Hx    Macular degeneration Neg Hx    Retinal detachment Neg Hx    Strabismus Neg Hx    Retinitis pigmentosa Neg Hx    Colon cancer Neg Hx    SOCIAL HISTORY Social History   Tobacco Use   Smoking status: Former    Packs/day: 0.25    Years: 3.00    Total pack years: 0.75    Types: Cigarettes    Quit date: 04/27/1964    Years since quitting: 57.5   Smokeless tobacco: Never   Tobacco comments:    years ago 50 plus years  Vaping Use   Vaping Use: Never used  Substance Use Topics   Alcohol use: No   Drug use: No       OPHTHALMIC EXAM: Base Eye Exam     Visual Acuity (Snellen - Linear)       Right Left   Dist cc 20/20 -2 20/50   Dist ph cc  20/40    Correction: Glasses         Tonometry (Tonopen, 10:11 AM)       Right Left   Pressure 18 19          Pupils       Dark Light Shape React APD   Right 2 1 Round Brisk None   Left 2 1 Round Brisk None         Visual Fields       Left Right    Full Full         Extraocular Movement       Right Left    Full, Ortho Full,  Ortho         Neuro/Psych     Oriented x3: Yes   Mood/Affect: Normal         Dilation     Both eyes: 1.0% Mydriacyl, 2.5% Phenylephrine @ 10:09 AM           Slit Lamp and Fundus Exam     Slit Lamp Exam       Right Left   Lids/Lashes Dermatochalasis - upper lid, Meibomian gland dysfunction, Scurf Dermatochalasis - upper lid, Meibomian gland dysfunction   Conjunctiva/Sclera White and quiet White and quiet   Cornea Arcus, 2+ Punctate epithelial erosions, Debris in tear film, Well healed cataract wounds, nasal and temporal LRI Mild arcus, 2+ Punctate epithelial erosions, nasal and temporal LRI, Debris in tear film   Anterior Chamber Deep and quiet Deep and quiet   Iris Round and moderately dilated Round and moderately dilated    Lens PC IOL in good position with open PC PC IOL in good position, open PC   Anterior Vitreous Vitreous syneresis Vitreous syneresis, Posterior vitreous detachment         Fundus Exam       Right Left   Disc Disc heme and focal edema at 0430 - persistent, Pink and Sharp Tilted, +collateral vessels temporally, vascular loops, inferior rim thinning, temporal pallor, Sharp rim, +cupping   C/D Ratio 0.5 0.7   Macula Good foveal reflex, Retinal pigment epithelial mottling, trace cystic changes - stably improved, No heme or edema good foveal reflex; central cystic changes - slightly increased, fine exudate superior macula -- improved, trace ERM, no heme   Vessels attenuated, Tortuous attenuated, Tortuous   Periphery Attached, scattered MAs Attached, scattered MAs           Refraction     Wearing Rx       Sphere Cylinder Axis Add   Right +1.50 +1.50 014 +2.50   Left +2.00 +1.00 178 +2.50            IMAGING AND PROCEDURES  Imaging and Procedures for 05/25/17  OCT, Retina - OU - Both Eyes       Right Eye Quality was borderline. Central Foveal Thickness: 261. Progression has been stable. Findings include normal foveal contour, no IRF, no SRF, epiretinal membrane (stable improvement in IRF ).   Left Eye Quality was good. Central Foveal Thickness: 259. Progression has worsened. Findings include normal foveal contour, no IRF, no SRF, intraretinal hyper-reflective material, epiretinal membrane (Mild interval increase in IRF/cystic changes temporal fovea ).   Notes *Images captured and stored on drive  Diagnosis / Impression:  OD: stable improvement in IRF  OS: BRVO w/ CME OS -- Mild interval increase in IRF/cystic changes temporal fovea   Clinical management:  See below  Abbreviations: NFP - Normal foveal profile. CME - cystoid macular edema. PED - pigment epithelial detachment. IRF - intraretinal fluid. SRF - subretinal fluid. EZ - ellipsoid zone. ERM - epiretinal membrane. ORA - outer retinal atrophy. ORT - outer retinal tubulation. SRHM - subretinal hyper-reflective material       Intravitreal Injection, Pharmacologic Agent - OS - Left Eye       Time Out 11/04/2021. 10:35 AM. Confirmed correct patient, procedure, site, and patient consented.   Anesthesia Topical anesthesia was used. Anesthetic medications included Lidocaine 2%, Proparacaine 0.5%.   Procedure Preparation included 5% betadine to ocular surface, eyelid speculum. A (32g) needle was used.   Injection: 1.25 mg Bevacizumab 1.'25mg'$ /0.23m   Route: Intravitreal, Site:  Left Eye   Frazee: [id number], Lot: [id number], Expiration date: 12/17/2021   Post-op Post injection exam found visual acuity of at least counting fingers. The patient tolerated the procedure well. There were no complications. The patient received written and verbal post procedure care education. Post injection medications were not given.              ASSESSMENT/PLAN:    ICD-10-CM   1. Branch retinal vein occlusion of left eye with macular edema  H34.8320 OCT, Retina - OU - Both Eyes    Intravitreal Injection, Pharmacologic Agent - OS - Left Eye    Bevacizumab (AVASTIN) SOLN 1.25 mg    2. Essential hypertension  I10     3. Hypertensive retinopathy of both eyes  H35.033     4. Posterior vitreous detachment of left eye  H43.812     5. 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 Pseudophakia of both eyes  Z96.1     7. PCO (posterior capsular opacification), left  H26.492       1. BRVO w/ macular edema OS  - delayed follow up from 4-5 weeks to 22 months (from Jan 2021 to Nov 2022)  - on presenting exam, superior temporal venule was dilated and tortuous but no significant intraretinal hemorrhages suggesting perhaps a remote BRVO event had occurred  - FA at presentation (01.23.19) shows significant leakage from branches from the superior temporal venules corresponding to central cystic changes noted on OCT  - S/P IVA OS #1 (01.23.19), #2 (02.25.19), #3 (04.01.19), #4 (05.07.19), #5 (06.25.19), #6 (02.21.20), #7 (09.16.20), #8 (10.21.20), #9 (12.08.20), #10 (01.22.21), #11 (11.21.22), #12 (01.23.23), #13 (03.27.23), #14 (06.12.23)  - lost to follow up from February to Sept 2020 and from January 2021 to November 2022  **history of increased IRF/cystic changes at 13 wks, noted on 09.11.23 visit**  - OCT today shows mild interval increase in IRF/cystic changes temporal fovea at 13 weeks  - BCVA OS stable at 20/40 from 20/30  - recommend IVA OS #15 today (09.11.23) with follow up back to 11-12 weeks  - RBA of procedure discussed, questions answered  - informed consent for IVA OS obtained and signed 11.21.22  - see procedure note  - F/U 11-12 weeks -- DFE/OCT/possible injection  2,3. Hypertensive retinopathy OU  - discussed importance of tight BP control  - monitor  4. PVD OS  - Discussed findings and prognosis  - No RT or  RD on 360 peripheral exam  - Reviewed s/s of RT/RD  - strict return precautions for any such RT/RD symptoms  5. Glaucoma Suspect OS  - under the expert management of Dr. Kathlen Mody  6,7. Pseudophakia OU  - s/p CE/IOL OU 11 yrs ago at [facility]  - s/p YAG capsulotomy OU  - OS re-YAG'd by Dr. Kathlen Mody -- PC opening improved  - monitor  Ophthalmic Meds Ordered this visit:  Meds ordered this encounter  Medications   Bevacizumab (AVASTIN) SOLN 1.25 mg     Return for f/u 11-12 weeks, BRVO OS, DFE, OCT.  There are no Patient Instructions on file for this visit.   Explained the diagnoses, plan, and follow up with the patient and they expressed understanding.  Patient expressed understanding of the importance of proper follow up care.   This document serves as a record of services personally performed by Gardiner Sleeper, MD, PhD. It was created on their behalf by Roselee Nova, COMT. The creation of this record  is the provider's dictation and/or activities during the visit.  Electronically signed by: Roselee Nova, COMT 11/04/21 1:07 PM  Gardiner Sleeper, M.D., Ph.D. Diseases & Surgery of the Retina and Vitreous [facility]  I have reviewed the above documentation for accuracy and completeness, and I agree with the above. Gardiner Sleeper, M.D., Ph.D. 11/04/21 1:07 PM   Abbreviations: M myopia (nearsighted); A astigmatism; H hyperopia (farsighted); P presbyopia; Mrx spectacle prescription;  CTL contact lenses; OD right eye; OS left eye; OU both eyes  XT exotropia; ET esotropia; PEK punctate epithelial keratitis; PEE punctate epithelial erosions; DES dry eye syndrome; MGD meibomian gland dysfunction; ATs artificial tears; PFAT's preservative free artificial tears; [place] nuclear sclerotic cataract; PSC posterior subcapsular cataract; ERM epi-retinal membrane; PVD posterior vitreous detachment; RD retinal detachment; DM diabetes mellitus; DR diabetic retinopathy; NPDR  non-proliferative diabetic retinopathy; PDR proliferative diabetic retinopathy; CSME clinically significant macular edema; DME diabetic macular edema; dbh dot blot hemorrhages; CWS cotton wool spot; POAG primary open angle glaucoma; C/D cup-to-disc ratio; HVF humphrey visual field; GVF goldmann visual field; OCT optical coherence tomography; IOP intraocular pressure; BRVO Branch retinal vein occlusion; CRVO central retinal vein occlusion; CRAO central retinal artery occlusion; BRAO branch retinal artery occlusion; RT retinal tear; SB scleral buckle; PPV pars plana vitrectomy; VH Vitreous hemorrhage; PRP panretinal laser photocoagulation; IVK intravitreal kenalog; VMT vitreomacular traction; MH Macular hole;  NVD neovascularization of the disc; NVE neovascularization elsewhere; AREDS age related eye disease study; ARMD age related macular degeneration; POAG primary open angle glaucoma; EBMD epithelial/anterior basement membrane dystrophy; ACIOL anterior chamber intraocular lens; IOL intraocular lens; PCIOL posterior chamber intraocular lens; Phaco/IOL phacoemulsification with intraocular lens placement; [place] photorefractive keratectomy; LASIK laser assisted in situ keratomileusis; HTN hypertension; DM diabetes mellitus; COPD chronic obstructive pulmonary disease

## 2021-11-04 ENCOUNTER — Ambulatory Visit (INDEPENDENT_AMBULATORY_CARE_PROVIDER_SITE_OTHER): Payer: Medicare Other | Admitting: Ophthalmology

## 2021-11-04 ENCOUNTER — Encounter (INDEPENDENT_AMBULATORY_CARE_PROVIDER_SITE_OTHER): Payer: Self-pay | Admitting: Ophthalmology

## 2021-11-04 DIAGNOSIS — I10 Essential (primary) hypertension: Secondary | ICD-10-CM | POA: Diagnosis not present

## 2021-11-04 DIAGNOSIS — H43812 Vitreous degeneration, left eye: Secondary | ICD-10-CM

## 2021-11-04 DIAGNOSIS — H34832 Tributary (branch) retinal vein occlusion, left eye, with macular edema: Secondary | ICD-10-CM

## 2021-11-04 DIAGNOSIS — Z961 Presence of intraocular lens: Secondary | ICD-10-CM

## 2021-11-04 DIAGNOSIS — H40002 Preglaucoma, unspecified, left eye: Secondary | ICD-10-CM | POA: Diagnosis not present

## 2021-11-04 DIAGNOSIS — H35033 Hypertensive retinopathy, bilateral: Secondary | ICD-10-CM | POA: Diagnosis not present

## 2021-11-04 DIAGNOSIS — H26492 Other secondary cataract, left eye: Secondary | ICD-10-CM

## 2021-11-04 MED ORDER — BEVACIZUMAB CHEMO INJECTION 1.25MG/0.05ML SYRINGE FOR KALEIDOSCOPE
1.2500 mg | INTRAVITREAL | Status: AC | PRN
  Administered 2021-11-04: 1.25 mg via INTRAVITREAL

## 2021-11-05 DIAGNOSIS — I10 Essential (primary) hypertension: Secondary | ICD-10-CM | POA: Diagnosis not present

## 2021-11-05 DIAGNOSIS — K2100 Gastro-esophageal reflux disease with esophagitis, without bleeding: Secondary | ICD-10-CM | POA: Diagnosis not present

## 2021-12-10 DIAGNOSIS — Z23 Encounter for immunization: Secondary | ICD-10-CM | POA: Diagnosis not present

## 2021-12-16 DIAGNOSIS — Z961 Presence of intraocular lens: Secondary | ICD-10-CM | POA: Diagnosis not present

## 2021-12-16 DIAGNOSIS — H02834 Dermatochalasis of left upper eyelid: Secondary | ICD-10-CM | POA: Diagnosis not present

## 2021-12-16 DIAGNOSIS — H353111 Nonexudative age-related macular degeneration, right eye, early dry stage: Secondary | ICD-10-CM | POA: Diagnosis not present

## 2021-12-16 DIAGNOSIS — H02831 Dermatochalasis of right upper eyelid: Secondary | ICD-10-CM | POA: Diagnosis not present

## 2021-12-16 DIAGNOSIS — H353222 Exudative age-related macular degeneration, left eye, with inactive choroidal neovascularization: Secondary | ICD-10-CM | POA: Diagnosis not present

## 2022-01-07 NOTE — Progress Notes (Signed)
[facility] Note  01/21/2022     CHIEF COMPLAINT Patient presents for Retina Follow Up  HISTORY OF PRESENT ILLNESS: Martha Richard is a 88 y.o. female who presents to the clinic today for:   HPI     Retina Follow Up   Patient presents with  CRVO/BRVO.  In left eye.  This started -1 weeks ago.  Duration of -1 weeks.  Since onset it is stable.  I, the attending physician,  performed the HPI with the patient and updated documentation appropriately.        Comments   11-12 week retina follow up BRVO OS and IVA OS pt feels like her vision has improved a little since her last visit       Last edited by Bernarda Caffey, MD on 01/21/2022 10:19 PM.     Pt states    Referring physician: Manon Hilding, MD [place],  [place] [address]  HISTORICAL INFORMATION:   Selected notes from the MEDICAL RECORD NUMBER Referred by Dr. Read Drivers for concern of cystic IRF OS    CURRENT MEDICATIONS: Current Outpatient Medications (Ophthalmic Drugs)  Medication Sig   hydroxypropyl methylcellulose / hypromellose (ISOPTO TEARS / GONIOVISC) 2.5 % ophthalmic solution Place 1 drop into both eyes 3 (three) times daily as needed for dry eyes.   No current facility-administered medications for this visit. (Ophthalmic Drugs)   Current Outpatient Medications (Other)  Medication Sig   hydrochlorothiazide (HYDRODIURIL) 12.5 MG tablet Take 12.5 mg by mouth daily as needed (salt intake).   Current Facility-Administered Medications (Other)  Medication Route   Bevacizumab (AVASTIN) SOLN 1.25 mg Intravitreal   Bevacizumab (AVASTIN) SOLN 1.25 mg Intravitreal   Bevacizumab (AVASTIN) SOLN 1.25 mg Intravitreal   Bevacizumab (AVASTIN) SOLN 1.25 mg Intravitreal   Bevacizumab (AVASTIN) SOLN 1.25 mg Intravitreal   Bevacizumab (AVASTIN) SOLN 1.25 mg Intravitreal   REVIEW OF SYSTEMS:   ALLERGIES Allergies  Allergen Reactions   Ace Inhibitors Cough   Caffeine Other (See  Comments)    "shoots my BP up"   Nsaids Other (See Comments)    GI upset     Peanut-Containing Drug Products Other (See Comments)    headaches   Ultram [Tramadol Hcl] Other (See Comments)    Headaches. Patient is unsure of this.   Penicillins Itching and Rash    Has patient had a PCN reaction causing immediate rash, facial/tongue/throat swelling, SOB or lightheadedness with hypotension: No Has patient had a PCN reaction causing severe rash involving mucus membranes or skin necrosis: No Has patient had a PCN reaction that required hospitalization No Has patient had a PCN reaction occurring within the last 10 years: No If all of the above answers are "NO", then may proceed with Cephalosporin use.  Tolerated Cephalosporin Date: 01/03/20.     PAST MEDICAL HISTORY Past Medical History:  Diagnosis Date   Aortic stenosis    Arrhythmia    palpitations   Arthritis    Cancer (HCC)    squamous cell Left hand   Complication of anesthesia    slow to wake up   Depression    Lost a son to cancer   Headache    hx of migraines   Hypertension    Hypertensive retinopathy    OU   Left bundle branch block    Past Surgical History:  Procedure Laterality Date   APPENDECTOMY     CATARACT EXTRACTION Bilateral    OU about  10 yrs ago   COLONOSCOPY WITH PROPOFOL N/A 05/01/2020   Surgeon: Eloise Harman, DO;Nonbleeding internal hemorrhoids, pancolonic diverticulosis, 1 7 mm tubular adenoma resected, single nonbleeding colonic angioectasia treated with APC.  No recommendations to repeat colonoscopy due to age.   EYE SURGERY     laser   fibroadenoma left breast removal     HOT HEMOSTASIS  05/01/2020   Procedure: HOT HEMOSTASIS (ARGON PLASMA COAGULATION/BICAP);  Surgeon: Eloise Harman, DO;  Location: [facility];  Service: Endoscopy;;   OTHER SURGICAL HISTORY     appendectomy   POLYPECTOMY  05/01/2020   Procedure: POLYPECTOMY;  Surgeon: Eloise Harman, DO;  Location: [facility];   Service: Endoscopy;;   TOTAL HIP ARTHROPLASTY Left 06/18/2016   Procedure: LEFT TOTAL HIP ARTHROPLASTY ANTERIOR APPROACH;  Surgeon: Gaynelle Arabian, MD;  Location: [facility];  Service: Orthopedics;  Laterality: Left;   TOTAL HIP ARTHROPLASTY Right 05/05/2018   Procedure: TOTAL HIP ARTHROPLASTY ANTERIOR APPROACH;  Surgeon: Gaynelle Arabian, MD;  Location: [facility];  Service: Orthopedics;  Laterality: Right;   TOTAL KNEE ARTHROPLASTY Right 01/02/2020   Procedure: TOTAL KNEE ARTHROPLASTY;  Surgeon: Gaynelle Arabian, MD;  Location: [facility];  Service: Orthopedics;  Laterality: Right;  61mn   UTERINE FIBROID SURGERY     FAMILY HISTORY Family History  Problem Relation Age of Onset   Heart disease Mother    Heart attack Mother    Heart failure Mother    Stroke Father    Heart attack Maternal Aunt    Heart disease Maternal Aunt    Heart failure Maternal Aunt    Amblyopia Neg Hx    Blindness Neg Hx    Cataracts Neg Hx    Glaucoma Neg Hx    Macular degeneration Neg Hx    Retinal detachment Neg Hx    Strabismus Neg Hx    Retinitis pigmentosa Neg Hx    Colon cancer Neg Hx    SOCIAL HISTORY Social History   Tobacco Use   Smoking status: Former    Packs/day: 0.25    Years: 3.00    Total pack years: 0.75    Types: Cigarettes    Quit date: 04/27/1964    Years since quitting: 57.7   Smokeless tobacco: Never   Tobacco comments:    years ago 50 plus years  Vaping Use   Vaping Use: Never used  Substance Use Topics   Alcohol use: No   Drug use: No       OPHTHALMIC EXAM: Base Eye Exam     Visual Acuity (Snellen - Linear)       Right Left   Dist cc 20/20 -1 20/40 -1   Dist ph cc  NI    Correction: Glasses         Tonometry (Tonopen, 1:26 PM)       Right Left   Pressure 19 19         Pupils       Pupils Dark Light Shape React APD   Right PERRL 2 1 Round Brisk None   Left PERRL 2 1 Round Brisk None         Visual Fields       Left Right    Full Full          Extraocular Movement       Right Left    Full, Ortho Full, Ortho         Neuro/Psych     Oriented x3:  Yes   Mood/Affect: Normal         Dilation     Both eyes: 2.5% Phenylephrine @ 1:26 PM           Slit Lamp and Fundus Exam     Slit Lamp Exam       Right Left   Lids/Lashes Dermatochalasis - upper lid, Meibomian gland dysfunction, Scurf Dermatochalasis - upper lid, Meibomian gland dysfunction   Conjunctiva/Sclera White and quiet White and quiet   Cornea Arcus, 2+ Punctate epithelial erosions, Debris in tear film, Well healed cataract wounds, nasal and temporal LRI Mild arcus, 2+ Punctate epithelial erosions, nasal and temporal LRI, Debris in tear film   Anterior Chamber Deep and quiet Deep and quiet   Iris Round and moderately dilated Round and moderately dilated    Lens PC IOL in good position with open PC PC IOL in good position, open PC   Anterior Vitreous Vitreous syneresis Vitreous syneresis, Posterior vitreous detachment         Fundus Exam       Right Left   Disc Disc heme and focal edema at 0430 - persistent, Pink and Sharp Tilted, +collateral vessels temporally, vascular loops, inferior rim thinning, temporal pallor, Sharp rim, +cupping   C/D Ratio 0.5 0.7   Macula Good foveal reflex, Retinal pigment epithelial mottling, trace cystic changes - stably improved, No heme or edema good foveal reflex; central cystic changes - improved, fine exudate superior macula -- improved, trace ERM, rare punctate IRH   Vessels attenuated, Tortuous attenuated, Tortuous   Periphery Attached, scattered MAs Attached, scattered MAs           Refraction     Wearing Rx       Sphere Cylinder Axis Add   Right +1.50 +1.50 014 +2.50   Left +2.00 +1.00 178 +2.50           IMAGING AND PROCEDURES  Imaging and Procedures for 05/25/17  OCT, Retina - OU - Both Eyes       Right Eye Quality was borderline. Central Foveal Thickness: 265. Progression has been stable.  Findings include normal foveal contour, no IRF, no SRF, epiretinal membrane (stable improvement in IRF ).   Left Eye Quality was good. Central Foveal Thickness: 248. Progression has improved. Findings include normal foveal contour, no IRF, no SRF, intraretinal hyper-reflective material, epiretinal membrane (interval improvement in IRF/cystic changes temporal fovea ).   Notes *Images captured and stored on drive  Diagnosis / Impression:  OD: stable improvement in IRF  OS: BRVO w/ CME OS -- interval improvement in IRF/cystic changes temporal fovea    Clinical management:  See below  Abbreviations: NFP - Normal foveal profile. CME - cystoid macular edema. PED - pigment epithelial detachment. IRF - intraretinal fluid. SRF - subretinal fluid. EZ - ellipsoid zone. ERM - epiretinal membrane. ORA - outer retinal atrophy. ORT - outer retinal tubulation. SRHM - subretinal hyper-reflective material       Intravitreal Injection, Pharmacologic Agent - OS - Left Eye       Time Out 01/21/2022. 2:30 PM. Confirmed correct patient, procedure, site, and patient consented.   Anesthesia Topical anesthesia was used. Anesthetic medications included Lidocaine 2%, Proparacaine 0.5%.   Procedure Preparation included 5% betadine to ocular surface, eyelid speculum. A (32g) needle was used.   Injection: 1.25 mg Bevacizumab 1.'25mg'$ /0.21m   Route: Intravitreal, Site: Left Eye   NDC:: 01027-253-66 Lot: [id number]'@4'$ , Expiration date: 03/12/2022   Post-op Post injection exam found visual  acuity of at least counting fingers. The patient tolerated the procedure well. There were no complications. The patient received written and verbal post procedure care education. Post injection medications were not given.            ASSESSMENT/PLAN:    ICD-10-CM   1. Branch retinal vein occlusion of left eye with macular edema  H34.8320 OCT, Retina - OU - Both Eyes    Intravitreal Injection, Pharmacologic Agent - OS -  Left Eye    Bevacizumab (AVASTIN) SOLN 1.25 mg    2. Essential hypertension  I10     3. Hypertensive retinopathy of both eyes  H35.033     4. Posterior vitreous detachment of left eye  H43.812     5. 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 Pseudophakia of both eyes  Z96.1      1. BRVO w/ macular edema OS  - delayed follow up from 4-5 weeks to 22 months (from Jan 2021 to Nov 2022)  - on presenting exam, superior temporal venule was dilated and tortuous but no significant intraretinal hemorrhages suggesting perhaps a remote BRVO event had occurred  - FA at presentation (01.23.19) shows significant leakage from branches from the superior temporal venules corresponding to central cystic changes noted on OCT  - S/P IVA OS #1 (01.23.19), #2 (02.25.19), #3 (04.01.19), #4 (05.07.19), #5 (06.25.19), #6 (02.21.20), #7 (09.16.20), #8 (10.21.20), #9 (12.08.20), #10 (01.22.21), #11 (11.21.22), #12 (01.23.23), #13 (03.27.23), #14 (06.12.23), #15 (09.11.23)  - lost to follow up from February to Sept 2020 and from January 2021 to November 2022  **history of increased IRF/cystic changes at 13 wks, noted on 09.11.23 visit**  - OCT today shows interval improvement in IRF/cystic changes temporal fovea at 11 weeks  - BCVA OS stable at 20/40   - recommend IVA OS #16 today (11.28.23) with follow up in 11-12 weeks  - RBA of procedure discussed, questions answered  - informed consent for IVA OS obtained and signed 11.21.22  - see procedure note  - F/U 11-12 weeks -- DFE/OCT/possible injection  2,3. Hypertensive retinopathy OU  - discussed importance of tight BP control  - monitor  4. PVD OS  - Discussed findings and prognosis  - No RT or RD on 360 peripheral exam  - Reviewed s/s of RT/RD  - strict return precautions for any such RT/RD symptoms  5. Glaucoma Suspect OS  - under the expert management of Dr. Kathlen Mody  6,7. Pseudophakia OU  - s/p CE/IOL OU 11 yrs ago at [facility]  - s/p YAG  capsulotomy OU  - OS re-YAG'd by Dr. Kathlen Mody -- PC opening improved  - monitor  Ophthalmic Meds Ordered this visit:  Meds ordered this encounter  Medications   Bevacizumab (AVASTIN) SOLN 1.25 mg     Return for f/u 11-12 weeks, BRVO OS, DFE, OCT.  There are no Patient Instructions on file for this visit.   Explained the diagnoses, plan, and follow up with the patient and they expressed understanding.  Patient expressed understanding of the importance of proper follow up care.   This document serves as a record of services personally performed by Gardiner Sleeper, MD, PhD. It was created on their behalf by Renaldo Reel, [place] an ophthalmic technician. The creation of this record is the provider's dictation and/or activities during the visit.    Electronically signed by:  Renaldo Reel, COT  11.14.23 5:18 PM  This document serves as a record of  services personally performed by Gardiner Sleeper, MD, PhD. It was created on their behalf by San Jetty. Owens Shark, OA an ophthalmic technician. The creation of this record is the provider's dictation and/or activities during the visit.    Electronically signed by: San Jetty. Owens Shark, [place] 11.28.2023 5:18 PM  Gardiner Sleeper, M.D., Ph.D. Diseases & Surgery of the Retina and Vitreous [facility]  I have reviewed the above documentation for accuracy and completeness, and I agree with the above. Gardiner Sleeper, M.D., Ph.D. 01/22/22 5:18 PM  Abbreviations: M myopia (nearsighted); A astigmatism; H hyperopia (farsighted); P presbyopia; Mrx spectacle prescription;  CTL contact lenses; OD right eye; OS left eye; OU both eyes  XT exotropia; ET esotropia; PEK punctate epithelial keratitis; PEE punctate epithelial erosions; DES dry eye syndrome; MGD meibomian gland dysfunction; ATs artificial tears; PFAT's preservative free artificial tears; [place] nuclear sclerotic cataract; PSC posterior subcapsular cataract; ERM epi-retinal membrane; PVD  posterior vitreous detachment; RD retinal detachment; DM diabetes mellitus; DR diabetic retinopathy; NPDR non-proliferative diabetic retinopathy; PDR proliferative diabetic retinopathy; CSME clinically significant macular edema; DME diabetic macular edema; dbh dot blot hemorrhages; CWS cotton wool spot; POAG primary open angle glaucoma; C/D cup-to-disc ratio; HVF humphrey visual field; GVF goldmann visual field; OCT optical coherence tomography; IOP intraocular pressure; BRVO Branch retinal vein occlusion; CRVO central retinal vein occlusion; CRAO central retinal artery occlusion; BRAO branch retinal artery occlusion; RT retinal tear; SB scleral buckle; PPV pars plana vitrectomy; VH Vitreous hemorrhage; PRP panretinal laser photocoagulation; IVK intravitreal kenalog; VMT vitreomacular traction; MH Macular hole;  NVD neovascularization of the disc; NVE neovascularization elsewhere; AREDS age related eye disease study; ARMD age related macular degeneration; POAG primary open angle glaucoma; EBMD epithelial/anterior basement membrane dystrophy; ACIOL anterior chamber intraocular lens; IOL intraocular lens; PCIOL posterior chamber intraocular lens; Phaco/IOL phacoemulsification with intraocular lens placement; [place] photorefractive keratectomy; LASIK laser assisted in situ keratomileusis; HTN hypertension; DM diabetes mellitus; COPD chronic obstructive pulmonary disease

## 2022-01-21 ENCOUNTER — Ambulatory Visit (INDEPENDENT_AMBULATORY_CARE_PROVIDER_SITE_OTHER): Payer: Medicare Other | Admitting: Ophthalmology

## 2022-01-21 ENCOUNTER — Encounter (INDEPENDENT_AMBULATORY_CARE_PROVIDER_SITE_OTHER): Payer: Self-pay | Admitting: Ophthalmology

## 2022-01-21 DIAGNOSIS — H43812 Vitreous degeneration, left eye: Secondary | ICD-10-CM

## 2022-01-21 DIAGNOSIS — H34832 Tributary (branch) retinal vein occlusion, left eye, with macular edema: Secondary | ICD-10-CM

## 2022-01-21 DIAGNOSIS — I10 Essential (primary) hypertension: Secondary | ICD-10-CM | POA: Diagnosis not present

## 2022-01-21 DIAGNOSIS — H35033 Hypertensive retinopathy, bilateral: Secondary | ICD-10-CM | POA: Diagnosis not present

## 2022-01-21 DIAGNOSIS — Z961 Presence of intraocular lens: Secondary | ICD-10-CM | POA: Diagnosis not present

## 2022-01-21 DIAGNOSIS — H40002 Preglaucoma, unspecified, left eye: Secondary | ICD-10-CM | POA: Diagnosis not present

## 2022-01-22 MED ORDER — BEVACIZUMAB CHEMO INJECTION 1.25MG/0.05ML SYRINGE FOR KALEIDOSCOPE
1.2500 mg | INTRAVITREAL | Status: AC | PRN
  Administered 2022-01-22: 1.25 mg via INTRAVITREAL

## 2022-03-25 NOTE — Progress Notes (Shared)
[facility] Note  04/08/2022     CHIEF COMPLAINT Patient presents for No chief complaint on file.  HISTORY OF PRESENT ILLNESS: Martha Richard is a 89 y.o. female who presents to the clinic today for:     Pt states    Referring physician: Manon Hilding, MD [place],  [place] [id number]  HISTORICAL INFORMATION:   Selected notes from the MEDICAL RECORD NUMBER Referred by Dr. Read Drivers for concern of cystic IRF OS    CURRENT MEDICATIONS: Current Outpatient Medications (Ophthalmic Drugs)  Medication Sig   hydroxypropyl methylcellulose / hypromellose (ISOPTO TEARS / GONIOVISC) 2.5 % ophthalmic solution Place 1 drop into both eyes 3 (three) times daily as needed for dry eyes.   No current facility-administered medications for this visit. (Ophthalmic Drugs)   Current Outpatient Medications (Other)  Medication Sig   hydrochlorothiazide (HYDRODIURIL) 12.5 MG tablet Take 12.5 mg by mouth daily as needed (salt intake).   Current Facility-Administered Medications (Other)  Medication Route   Bevacizumab (AVASTIN) SOLN 1.25 mg Intravitreal   Bevacizumab (AVASTIN) SOLN 1.25 mg Intravitreal   Bevacizumab (AVASTIN) SOLN 1.25 mg Intravitreal   Bevacizumab (AVASTIN) SOLN 1.25 mg Intravitreal   Bevacizumab (AVASTIN) SOLN 1.25 mg Intravitreal   Bevacizumab (AVASTIN) SOLN 1.25 mg Intravitreal   REVIEW OF SYSTEMS:   ALLERGIES Allergies  Allergen Reactions   Ace Inhibitors Cough   Caffeine Other (See Comments)    "shoots my BP up"   Nsaids Other (See Comments)    GI upset     Peanut-Containing Drug Products Other (See Comments)    headaches   Ultram [Tramadol Hcl] Other (See Comments)    Headaches. Patient is unsure of this.   Penicillins Itching and Rash    Has patient had a PCN reaction causing immediate rash, facial/tongue/throat swelling, SOB or lightheadedness with hypotension: No Has patient had a PCN reaction causing severe rash involving  mucus membranes or skin necrosis: No Has patient had a PCN reaction that required hospitalization No Has patient had a PCN reaction occurring within the last 10 years: No If all of the above answers are "NO", then may proceed with Cephalosporin use.  Tolerated Cephalosporin Date: 01/03/20.     PAST MEDICAL HISTORY Past Medical History:  Diagnosis Date   Aortic stenosis    Arrhythmia    palpitations   Arthritis    Cancer (HCC)    squamous cell Left hand   Complication of anesthesia    slow to wake up   Depression    Lost a son to cancer   Headache    hx of migraines   Hypertension    Hypertensive retinopathy    OU   Left bundle branch block    Past Surgical History:  Procedure Laterality Date   APPENDECTOMY     CATARACT EXTRACTION Bilateral    OU about 10 yrs ago   COLONOSCOPY WITH PROPOFOL N/A 05/01/2020   Surgeon: Eloise Harman, DO;Nonbleeding internal hemorrhoids, pancolonic diverticulosis, 1 7 mm tubular adenoma resected, single nonbleeding colonic angioectasia treated with APC.  No recommendations to repeat colonoscopy due to age.   EYE SURGERY     laser   fibroadenoma left breast removal     HOT HEMOSTASIS  05/01/2020   Procedure: HOT HEMOSTASIS (ARGON PLASMA COAGULATION/BICAP);  Surgeon: Eloise Harman, DO;  [facility];  Service: Endoscopy;;   OTHER SURGICAL HISTORY     appendectomy   POLYPECTOMY  05/01/2020   Procedure: POLYPECTOMY;  Surgeon: Eloise Harman, DO;  Location: [facility];  Service: Endoscopy;;   TOTAL HIP ARTHROPLASTY Left 06/18/2016   Procedure: LEFT TOTAL HIP ARTHROPLASTY ANTERIOR APPROACH;  Surgeon: Gaynelle Arabian, MD;  Location: [facility];  Service: Orthopedics;  Laterality: Left;   TOTAL HIP ARTHROPLASTY Right 05/05/2018   Procedure: TOTAL HIP ARTHROPLASTY ANTERIOR APPROACH;  Surgeon: Gaynelle Arabian, MD;  Location: [facility];  Service: Orthopedics;  Laterality: Right;   TOTAL KNEE ARTHROPLASTY Right 01/02/2020   Procedure:  TOTAL KNEE ARTHROPLASTY;  Surgeon: Gaynelle Arabian, MD;  Location: [facility];  Service: Orthopedics;  Laterality: Right;  63mn   UTERINE FIBROID SURGERY     FAMILY HISTORY Family History  Problem Relation Age of Onset   Heart disease Mother    Heart attack Mother    Heart failure Mother    Stroke Father    Heart attack Maternal Aunt    Heart disease Maternal Aunt    Heart failure Maternal Aunt    Amblyopia Neg Hx    Blindness Neg Hx    Cataracts Neg Hx    Glaucoma Neg Hx    Macular degeneration Neg Hx    Retinal detachment Neg Hx    Strabismus Neg Hx    Retinitis pigmentosa Neg Hx    Colon cancer Neg Hx    SOCIAL HISTORY Social History   Tobacco Use   Smoking status: Former    Packs/day: 0.25    Years: 3.00    Total pack years: 0.75    Types: Cigarettes    Quit date: 04/27/1964    Years since quitting: 57.9   Smokeless tobacco: Never   Tobacco comments:    years ago 531plus years  Vaping Use   Vaping Use: Never used  Substance Use Topics   Alcohol use: No   Drug use: No       OPHTHALMIC EXAM: Not recorded    IMAGING AND PROCEDURES  Imaging and Procedures for 05/25/17          ASSESSMENT/PLAN:    ICD-10-CM   1. Branch retinal vein occlusion of left eye with macular edema  H34.8320     2. Essential hypertension  I10     3. Hypertensive retinopathy of both eyes  H35.033     4. Posterior vitreous detachment of left eye  H43.812     5. 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 Pseudophakia of both eyes  Z96.1     7. PCO (posterior capsular opacification), left  H26.492       1. BRVO w/ macular edema OS  - delayed follow up from 4-5 weeks to 22 months (from Jan 2021 to Nov 2022)  - on presenting exam, superior temporal venule was dilated and tortuous but no significant intraretinal hemorrhages suggesting perhaps a remote BRVO event had occurred  - FA at presentation (01.23.19) shows significant leakage from branches from the superior temporal  venules corresponding to central cystic changes noted on OCT  - S/P IVA OS #1 (01.23.19), #2 (02.25.19), #3 (04.01.19), #4 (05.07.19), #5 (06.25.19), #6 (02.21.20), #7 (09.16.20), #8 (10.21.20), #9 (12.08.20), #10 (01.22.21), #11 (11.21.22), #12 (01.23.23), #13 (03.27.23), #14 (06.12.23), #15 (09.11.23), 316 (11.28.23)  - lost to follow up from February to Sept 2020 and from January 2021 to November 2022  **history of increased IRF/cystic changes at 13 wks, noted on 09.11.23 visit**  - OCT today shows interval improvement in IRF/cystic changes temporal  fovea at 11 weeks  - BCVA OS stable at 20/40   - recommend IVA OS #17 today (02.13.24) with follow up in 11-12 weeks  - RBA of procedure discussed, questions answered  - informed consent for IVA OS obtained and signed 11.21.22  - see procedure note  - F/U 11-12 weeks -- DFE/OCT/possible injection  2,3. Hypertensive retinopathy OU  - discussed importance of tight BP control  - continue to monitor  4. PVD OS  - Discussed findings and prognosis  - No RT or RD on 360 peripheral exam  - Reviewed s/s of RT/RD  - strict return precautions for any such RT/RD symptoms  5. Glaucoma Suspect OS  - under the expert management of Dr. Kathlen Mody  6,7. Pseudophakia OU  - s/p CE/IOL OU 11 yrs ago at [facility]  - s/p YAG capsulotomy OU  - OS re-YAG'd by Dr. Kathlen Mody -- PC opening improved  - continue to monitor  Ophthalmic Meds Ordered this visit:  No orders of the defined types were placed in this encounter.    No follow-ups on file.  There are no Patient Instructions on file for this visit.   Explained the diagnoses, plan, and follow up with the patient and they expressed understanding.  Patient expressed understanding of the importance of proper follow up care.   This document serves as a record of services personally performed by Gardiner Sleeper, MD, PhD. It was created on their behalf by Renaldo Reel, [place] an ophthalmic technician.  The creation of this record is the provider's dictation and/or activities during the visit.    Electronically signed by:  Renaldo Reel, COT  01.30.24  7:56 AM   Gardiner Sleeper, M.D., Ph.D. Diseases & Surgery of the Retina and Vitreous [facility]: M myopia (nearsighted); A astigmatism; H hyperopia (farsighted); P presbyopia; Mrx spectacle prescription;  CTL contact lenses; OD right eye; OS left eye; OU both eyes  XT exotropia; ET esotropia; PEK punctate epithelial keratitis; PEE punctate epithelial erosions; DES dry eye syndrome; MGD meibomian gland dysfunction; ATs artificial tears; PFAT's preservative free artificial tears; [place] nuclear sclerotic cataract; PSC posterior subcapsular cataract; ERM epi-retinal membrane; PVD posterior vitreous detachment; RD retinal detachment; DM diabetes mellitus; DR diabetic retinopathy; NPDR non-proliferative diabetic retinopathy; PDR proliferative diabetic retinopathy; CSME clinically significant macular edema; DME diabetic macular edema; dbh dot blot hemorrhages; CWS cotton wool spot; POAG primary open angle glaucoma; C/D cup-to-disc ratio; HVF humphrey visual field; GVF goldmann visual field; OCT optical coherence tomography; IOP intraocular pressure; BRVO Branch retinal vein occlusion; CRVO central retinal vein occlusion; CRAO central retinal artery occlusion; BRAO branch retinal artery occlusion; RT retinal tear; SB scleral buckle; PPV pars plana vitrectomy; VH Vitreous hemorrhage; PRP panretinal laser photocoagulation; IVK intravitreal kenalog; VMT vitreomacular traction; MH Macular hole;  NVD neovascularization of the disc; NVE neovascularization elsewhere; AREDS age related eye disease study; ARMD age related macular degeneration; POAG primary open angle glaucoma; EBMD epithelial/anterior basement membrane dystrophy; ACIOL anterior chamber intraocular lens; IOL intraocular lens; PCIOL posterior chamber intraocular  lens; Phaco/IOL phacoemulsification with intraocular lens placement; [place] photorefractive keratectomy; LASIK laser assisted in situ keratomileusis; HTN hypertension; DM diabetes mellitus; COPD chronic obstructive pulmonary disease

## 2022-04-02 DIAGNOSIS — I10 Essential (primary) hypertension: Secondary | ICD-10-CM | POA: Diagnosis not present

## 2022-04-02 DIAGNOSIS — K2100 Gastro-esophageal reflux disease with esophagitis, without bleeding: Secondary | ICD-10-CM | POA: Diagnosis not present

## 2022-04-02 DIAGNOSIS — E7849 Other hyperlipidemia: Secondary | ICD-10-CM | POA: Diagnosis not present

## 2022-04-02 DIAGNOSIS — R7301 Impaired fasting glucose: Secondary | ICD-10-CM | POA: Diagnosis not present

## 2022-04-08 ENCOUNTER — Encounter (INDEPENDENT_AMBULATORY_CARE_PROVIDER_SITE_OTHER): Payer: Medicare Other | Admitting: Ophthalmology

## 2022-04-08 DIAGNOSIS — Z961 Presence of intraocular lens: Secondary | ICD-10-CM

## 2022-04-08 DIAGNOSIS — H35033 Hypertensive retinopathy, bilateral: Secondary | ICD-10-CM

## 2022-04-08 DIAGNOSIS — H43812 Vitreous degeneration, left eye: Secondary | ICD-10-CM

## 2022-04-08 DIAGNOSIS — H34832 Tributary (branch) retinal vein occlusion, left eye, with macular edema: Secondary | ICD-10-CM

## 2022-04-08 DIAGNOSIS — H26492 Other secondary cataract, left eye: Secondary | ICD-10-CM

## 2022-04-08 DIAGNOSIS — I10 Essential (primary) hypertension: Secondary | ICD-10-CM

## 2022-04-08 DIAGNOSIS — H40002 Preglaucoma, unspecified, left eye: Secondary | ICD-10-CM

## 2022-04-16 DIAGNOSIS — D696 Thrombocytopenia, unspecified: Secondary | ICD-10-CM | POA: Diagnosis not present

## 2022-04-16 DIAGNOSIS — E7849 Other hyperlipidemia: Secondary | ICD-10-CM | POA: Diagnosis not present

## 2022-04-16 DIAGNOSIS — I10 Essential (primary) hypertension: Secondary | ICD-10-CM | POA: Diagnosis not present

## 2022-04-16 DIAGNOSIS — K2100 Gastro-esophageal reflux disease with esophagitis, without bleeding: Secondary | ICD-10-CM | POA: Diagnosis not present

## 2022-04-16 DIAGNOSIS — D649 Anemia, unspecified: Secondary | ICD-10-CM | POA: Diagnosis not present

## 2022-04-16 DIAGNOSIS — R011 Cardiac murmur, unspecified: Secondary | ICD-10-CM | POA: Diagnosis not present

## 2022-04-16 DIAGNOSIS — R7301 Impaired fasting glucose: Secondary | ICD-10-CM | POA: Diagnosis not present

## 2022-04-16 DIAGNOSIS — Z23 Encounter for immunization: Secondary | ICD-10-CM | POA: Diagnosis not present

## 2022-04-21 ENCOUNTER — Encounter (INDEPENDENT_AMBULATORY_CARE_PROVIDER_SITE_OTHER): Payer: Medicare Other | Admitting: Ophthalmology

## 2022-07-24 NOTE — Progress Notes (Signed)
[facility] - Clinic Note  07/28/2022     CHIEF COMPLAINT Patient presents for Retina Follow Up  HISTORY OF PRESENT ILLNESS: Martha Richard is a 89 y.o. female who presents to the clinic today for:   HPI     Retina Follow Up   Patient presents with  CRVO/BRVO.  In left eye.  This started 7 months ago.  Duration of 7 months.  Since onset it is gradually worsening.  I, the attending physician,  performed the HPI with the patient and updated documentation appropriately.        Comments   7 month BRVO OS and IVA OS pt is delayed for 12 weeks due to having issues with getting here and getting care for her husband who has dementia she is reporting vision is not as sharp she is having some floaters at times but denies any flashes of light       Last edited by Rennis Chris, MD on 07/28/2022  4:49 PM.    Pt is delayed to follow up from January 21, 2022 -- 6+ months, she states she has been taking care of her husband who has dementia, she states her eyelids are blocking her vision, she uses AT's as needed  Referring physician: Estanislado Pandy, MD [address] [place],  [place] [address]  HISTORICAL INFORMATION:   Selected notes from the MEDICAL RECORD NUMBER Referred by Dr. Marcha Solders for concern of cystic IRF OS    CURRENT MEDICATIONS: Current Outpatient Medications (Ophthalmic Drugs)  Medication Sig   hydroxypropyl methylcellulose / hypromellose (ISOPTO TEARS / GONIOVISC) 2.5 % ophthalmic solution Place 1 drop into both eyes 3 (three) times daily as needed for dry eyes.   No current facility-administered medications for this visit. (Ophthalmic Drugs)   Current Outpatient Medications (Other)  Medication Sig   hydrochlorothiazide (HYDRODIURIL) 12.5 MG tablet Take 12.5 mg by mouth daily as needed (salt intake).   Current Facility-Administered Medications (Other)  Medication Route   Bevacizumab (AVASTIN) SOLN 1.25 mg Intravitreal   Bevacizumab (AVASTIN)  SOLN 1.25 mg Intravitreal   Bevacizumab (AVASTIN) SOLN 1.25 mg Intravitreal   Bevacizumab (AVASTIN) SOLN 1.25 mg Intravitreal   Bevacizumab (AVASTIN) SOLN 1.25 mg Intravitreal   Bevacizumab (AVASTIN) SOLN 1.25 mg Intravitreal   REVIEW OF SYSTEMS: ROS   Positive for: Musculoskeletal, Cardiovascular, Eyes Negative for: Constitutional, Gastrointestinal, Neurological, Skin, Genitourinary, HENT, Endocrine, Respiratory, Psychiatric, Allergic/Imm, Heme/Lymph Last edited by Etheleen Mayhew, COT on 07/28/2022  2:24 PM.      ALLERGIES Allergies  Allergen Reactions   Ace Inhibitors Cough   Caffeine Other (See Comments)    "shoots my BP up"   Nsaids Other (See Comments)    GI upset     Peanut-Containing Drug Products Other (See Comments)    headaches   Ultram [Tramadol Hcl] Other (See Comments)    Headaches. Patient is unsure of this.   Penicillins Itching and Rash    Has patient had a PCN reaction causing immediate rash, facial/tongue/throat swelling, SOB or lightheadedness with hypotension: No Has patient had a PCN reaction causing severe rash involving mucus membranes or skin necrosis: No Has patient had a PCN reaction that required hospitalization No Has patient had a PCN reaction occurring within the last 10 years: No If all of the above answers are "NO", then may proceed with Cephalosporin use.  Tolerated Cephalosporin Date: 01/03/20.     PAST MEDICAL HISTORY Past Medical History:  Diagnosis Date  Aortic stenosis    Arrhythmia    palpitations   Arthritis    Cancer (HCC)    squamous cell Left hand   Complication of anesthesia    slow to wake up   Depression    Lost a son to cancer   Headache    hx of migraines   Hypertension    Hypertensive retinopathy    OU   Left bundle branch block    Past Surgical History:  Procedure Laterality Date   APPENDECTOMY     CATARACT EXTRACTION Bilateral    OU about 10 yrs ago   COLONOSCOPY WITH PROPOFOL N/A 05/01/2020    Surgeon: Lanelle Bal, DO;Nonbleeding internal hemorrhoids, pancolonic diverticulosis, 1 7 mm tubular adenoma resected, single nonbleeding colonic angioectasia treated with APC.  No recommendations to repeat colonoscopy due to age.   EYE SURGERY     laser   fibroadenoma left breast removal     HOT HEMOSTASIS  05/01/2020   Procedure: HOT HEMOSTASIS (ARGON PLASMA COAGULATION/BICAP);  Surgeon: Lanelle Bal, DO;  Location: [facility];  Service: Endoscopy;;   OTHER SURGICAL HISTORY     appendectomy   POLYPECTOMY  05/01/2020   Procedure: POLYPECTOMY;  Surgeon: Lanelle Bal, DO;  Location: [facility];  Service: Endoscopy;;   TOTAL HIP ARTHROPLASTY Left 06/18/2016   Procedure: LEFT TOTAL HIP ARTHROPLASTY ANTERIOR APPROACH;  Surgeon: Ollen Gross, MD;  Location: [facility];  Service: Orthopedics;  Laterality: Left;   TOTAL HIP ARTHROPLASTY Right 05/05/2018   Procedure: TOTAL HIP ARTHROPLASTY ANTERIOR APPROACH;  Surgeon: Ollen Gross, MD;  Location: [facility];  Service: Orthopedics;  Laterality: Right;   TOTAL KNEE ARTHROPLASTY Right 01/02/2020   Procedure: TOTAL KNEE ARTHROPLASTY;  Surgeon: Ollen Gross, MD;  Location: [facility];  Service: Orthopedics;  Laterality: Right;    UTERINE FIBROID SURGERY     FAMILY HISTORY Family History  Problem Relation Age of Onset   Heart disease Mother    Heart attack Mother    Heart failure Mother    Stroke Father    Heart attack Maternal Aunt    Heart disease Maternal Aunt    Heart failure Maternal Aunt    Amblyopia Neg Hx    Blindness Neg Hx    Cataracts Neg Hx    Glaucoma Neg Hx    Macular degeneration Neg Hx    Retinal detachment Neg Hx    Strabismus Neg Hx    Retinitis pigmentosa Neg Hx    Colon cancer Neg Hx    SOCIAL HISTORY Social History   Tobacco Use   Smoking status: Former    Packs/day: 0.25    Years: 3.00    Additional pack years: 0.00    Total pack years: 0.75    Types: Cigarettes    Quit date: 04/27/1964     Years since quitting: 58.2   Smokeless tobacco: Never   Tobacco comments:    years ago 50 plus years  Vaping Use   Vaping Use: Never used  Substance Use Topics   Alcohol use: No   Drug use: No       OPHTHALMIC EXAM: Base Eye Exam     Visual Acuity (Snellen - Linear)       Right Left   Dist cc 20/20 -1 20/30 -1    Correction: Glasses         Tonometry (Tonopen, 2:27 PM)       Right Left   Pressure 18 18  Pupils       Pupils Dark Light Shape React APD   Right PERRL 2 1 Round Brisk None   Left PERRL 2 1 Round Brisk None         Visual Fields       Left Right    Full Full         Extraocular Movement       Right Left    Full, Ortho Full, Ortho         Neuro/Psych     Oriented x3: Yes   Mood/Affect: Normal         Dilation     Both eyes: 2.5% Phenylephrine @ 2:27 PM           Slit Lamp and Fundus Exam     Slit Lamp Exam       Right Left   Lids/Lashes Dermatochalasis - upper lid, Meibomian gland dysfunction, Scurf Dermatochalasis - upper lid, Meibomian gland dysfunction   Conjunctiva/Sclera White and quiet White and quiet   Cornea Arcus, 2+ Punctate epithelial erosions, tear film debris, Well healed cataract wounds, nasal and temporal LRI Mild arcus, trace Punctate epithelial erosions, nasal and temporal LRI, mild tear film debris   Anterior Chamber Deep and quiet Deep and quiet   Iris Round and moderately dilated Round and moderately dilated    Lens PC IOL in good position with open PC PC IOL in good position, open PC   Anterior Vitreous Vitreous syneresis Vitreous syneresis, Posterior vitreous detachment         Fundus Exam       Right Left   Disc +hyperemia and edema at 0430, Pink and Sharp Tilted, +collateral vessels temporally, vascular loops, inferior rim thinning, mild pallor, Sharp rim, +cupping   C/D Ratio 0.5 0.7   Macula Good foveal reflex, Retinal pigment epithelial mottling, No heme or edema good foveal  reflex; central cystic changes - improved, fine exudate superior macula -- improved, trace ERM, no heme   Vessels attenuated, Tortuous, mild AV crossing changes attenuated, Tortuous, mild AV crossing changes   Periphery Attached, No heme Attached, No heme           Refraction     Wearing Rx       Sphere Cylinder Axis Add   Right +1.50 +1.50 014 +2.50   Left +2.00 +1.00 178 +2.50           IMAGING AND PROCEDURES  Imaging and Procedures for 05/25/17  OCT, Retina - OU - Both Eyes       Right Eye Quality was borderline. Central Foveal Thickness: 263. Progression has been stable. Findings include normal foveal contour, no IRF, no SRF, epiretinal membrane (stable improvement in IRF ).   Left Eye Quality was good. Central Foveal Thickness: 252. Progression has been stable. Findings include normal foveal contour, no IRF, no SRF, intraretinal hyper-reflective material, epiretinal membrane (Stable improvement in IRF/cystic changes temporal fovea ).   Notes *Images captured and stored on drive  Diagnosis / Impression:  OD: stable improvement in IRF  OS: BRVO w/ CME OS -- stable improvement in IRF/cystic changes temporal fovea    Clinical management:  See below  Abbreviations: NFP - Normal foveal profile. CME - cystoid macular edema. PED - pigment epithelial detachment. IRF - intraretinal fluid. SRF - subretinal fluid. EZ - ellipsoid zone. ERM - epiretinal membrane. ORA - outer retinal atrophy. ORT - outer retinal tubulation. SRHM - subretinal hyper-reflective material  ASSESSMENT/PLAN:    ICD-10-CM   1. Branch retinal vein occlusion of left eye with macular edema  H34.8320 OCT, Retina - OU - Both Eyes    CANCELED: Intravitreal Injection, Pharmacologic Agent - OS - Left Eye    2. Essential hypertension  I10     3. Hypertensive retinopathy of both eyes  H35.033     4. Posterior vitreous detachment of left eye  H43.812     5. Glaucoma suspect of left eye   H40.002     6. Pseudophakia of both eyes  Z96.1     7. PCO (posterior capsular opacification), left  H26.492      1. BRVO w/ macular edema OS  - delayed follow up from 11-12 weeks to 6+ months (Nov. 2023 to June 2024)  - delayed follow up from 4-5 weeks to 22 months (from Jan 2021 to Nov 2022)  - on presenting exam, superior temporal venule was dilated and tortuous but no significant intraretinal hemorrhages suggesting perhaps a remote BRVO event had occurred  - FA at presentation (01.23.19) shows significant leakage from branches from the superior temporal venules corresponding to central cystic changes noted on OCT  - S/P IVA OS #1 (01.23.19), #2 (02.25.19), #3 (04.01.19), #4 (05.07.19), #5 (06.25.19), #6 (02.21.20), #7 (09.16.20), #8 (10.21.20), #9 (12.08.20), #10 (01.22.21), #11 (11.21.22), #12 (01.23.23), #13 (03.27.23), #14 (06.12.23), #15 (09.11.23), #16 (11.28.23)  - lost to follow up from February to Sept 2020 and from January 2021 to November 2022  **history of increased IRF/cystic changes at 13 wks, noted on 09.11.23 visit**  - OCT today shows stable improvement in IRF/cystic changes temporal fovea at 6+ months  - BCVA OS improved to 20/30 from 20/40   - recommend holding injection today with follow up in 6 months  - informed consent for IVA OS obtained and signed 11.21.22  - F/U 6 months, sooner prn -- DFE/OCT/possible injection  2,3. Hypertensive retinopathy OU  - discussed importance of tight BP control  - monitor  4. PVD OS  - Discussed findings and prognosis  - No RT or RD on 360 peripheral exam  - Reviewed s/s of RT/RD  - strict return precautions for any such RT/RD symptoms  5. Glaucoma Suspect OS  6,7. Pseudophakia OU  - s/p CE/IOL OU 11 yrs ago at [facility]  - s/p YAG capsulotomy OU  - OS re-YAG'd by Dr. Alben Spittle -- PC opening improved  - monitor  Ophthalmic Meds Ordered this visit:  No orders of the defined types were placed in this encounter.     Return in about 6 months (around 01/27/2023) for f/u BRVO OS, DFE, OCT.  There are no Patient Instructions on file for this visit.   Explained the diagnoses, plan, and follow up with the patient and they expressed understanding.  Patient expressed understanding of the importance of proper follow up care.   This document serves as a record of services personally performed by Karie Chimera, MD, PhD. It was created on their behalf by De Blanch, an ophthalmic technician. The creation of this record is the provider's dictation and/or activities during the visit.    Electronically signed by: De Blanch, OA, 07/28/22  4:50 PM  This document serves as a record of services personally performed by Karie Chimera, MD, PhD. It was created on their behalf by Glee Arvin. Manson Passey, OA an ophthalmic technician. The creation of this record is the provider's dictation and/or activities during the visit.    Electronically  signed by: Glee Arvin. Manson Passey, [place] 06.03.2024 4:50 PM  Karie Chimera, M.D., Ph.D. Diseases & Surgery of the Retina and Vitreous [facility]  I have reviewed the above documentation for accuracy and completeness, and I agree with the above. Karie Chimera, M.D., Ph.D. 07/28/22 4:52 PM   Abbreviations: M myopia (nearsighted); A astigmatism; H hyperopia (farsighted); P presbyopia; Mrx spectacle prescription;  CTL contact lenses; OD right eye; OS left eye; OU both eyes  XT exotropia; ET esotropia; PEK punctate epithelial keratitis; PEE punctate epithelial erosions; DES dry eye syndrome; MGD meibomian gland dysfunction; ATs artificial tears; PFAT's preservative free artificial tears; NSC nuclear sclerotic cataract; PSC posterior subcapsular cataract; ERM epi-retinal membrane; PVD posterior vitreous detachment; RD retinal detachment; DM diabetes mellitus; DR diabetic retinopathy; NPDR non-proliferative diabetic retinopathy; PDR proliferative diabetic retinopathy;  CSME clinically significant macular edema; DME diabetic macular edema; dbh dot blot hemorrhages; CWS cotton wool spot; POAG primary open angle glaucoma; C/D cup-to-disc ratio; HVF humphrey visual field; GVF goldmann visual field; OCT optical coherence tomography; IOP intraocular pressure; BRVO Branch retinal vein occlusion; CRVO central retinal vein occlusion; CRAO central retinal artery occlusion; BRAO branch retinal artery occlusion; RT retinal tear; SB scleral buckle; PPV pars plana vitrectomy; VH Vitreous hemorrhage; PRP panretinal laser photocoagulation; IVK intravitreal kenalog; VMT vitreomacular traction; MH Macular hole;  NVD neovascularization of the disc; NVE neovascularization elsewhere; AREDS age related eye disease study; ARMD age related macular degeneration; POAG primary open angle glaucoma; EBMD epithelial/anterior basement membrane dystrophy; ACIOL anterior chamber intraocular lens; IOL intraocular lens; PCIOL posterior chamber intraocular lens; Phaco/IOL phacoemulsification with intraocular lens placement; PRK photorefractive keratectomy; LASIK laser assisted in situ keratomileusis; HTN hypertension; DM diabetes mellitus; COPD chronic obstructive pulmonary disease

## 2022-07-28 ENCOUNTER — Ambulatory Visit (INDEPENDENT_AMBULATORY_CARE_PROVIDER_SITE_OTHER): Payer: Medicare Other | Admitting: Ophthalmology

## 2022-07-28 ENCOUNTER — Encounter (INDEPENDENT_AMBULATORY_CARE_PROVIDER_SITE_OTHER): Payer: Self-pay | Admitting: Ophthalmology

## 2022-07-28 DIAGNOSIS — H35033 Hypertensive retinopathy, bilateral: Secondary | ICD-10-CM

## 2022-07-28 DIAGNOSIS — Z961 Presence of intraocular lens: Secondary | ICD-10-CM | POA: Diagnosis not present

## 2022-07-28 DIAGNOSIS — H26492 Other secondary cataract, left eye: Secondary | ICD-10-CM | POA: Diagnosis not present

## 2022-07-28 DIAGNOSIS — I10 Essential (primary) hypertension: Secondary | ICD-10-CM

## 2022-07-28 DIAGNOSIS — H34832 Tributary (branch) retinal vein occlusion, left eye, with macular edema: Secondary | ICD-10-CM

## 2022-07-28 DIAGNOSIS — H40002 Preglaucoma, unspecified, left eye: Secondary | ICD-10-CM | POA: Diagnosis not present

## 2022-07-28 DIAGNOSIS — H43812 Vitreous degeneration, left eye: Secondary | ICD-10-CM | POA: Diagnosis not present

## 2022-10-08 DIAGNOSIS — E7849 Other hyperlipidemia: Secondary | ICD-10-CM | POA: Diagnosis not present

## 2022-10-08 DIAGNOSIS — D649 Anemia, unspecified: Secondary | ICD-10-CM | POA: Diagnosis not present

## 2022-10-08 DIAGNOSIS — R7301 Impaired fasting glucose: Secondary | ICD-10-CM | POA: Diagnosis not present

## 2022-10-08 DIAGNOSIS — E782 Mixed hyperlipidemia: Secondary | ICD-10-CM | POA: Diagnosis not present

## 2022-10-08 DIAGNOSIS — E7800 Pure hypercholesterolemia, unspecified: Secondary | ICD-10-CM | POA: Diagnosis not present

## 2022-10-08 DIAGNOSIS — K2100 Gastro-esophageal reflux disease with esophagitis, without bleeding: Secondary | ICD-10-CM | POA: Diagnosis not present

## 2022-10-15 DIAGNOSIS — E7849 Other hyperlipidemia: Secondary | ICD-10-CM | POA: Diagnosis not present

## 2022-10-15 DIAGNOSIS — Z6824 Body mass index (BMI) 24.0-24.9, adult: Secondary | ICD-10-CM | POA: Diagnosis not present

## 2022-10-15 DIAGNOSIS — K2100 Gastro-esophageal reflux disease with esophagitis, without bleeding: Secondary | ICD-10-CM | POA: Diagnosis not present

## 2022-10-15 DIAGNOSIS — I10 Essential (primary) hypertension: Secondary | ICD-10-CM | POA: Diagnosis not present

## 2022-10-15 DIAGNOSIS — R011 Cardiac murmur, unspecified: Secondary | ICD-10-CM | POA: Diagnosis not present

## 2022-10-15 DIAGNOSIS — D696 Thrombocytopenia, unspecified: Secondary | ICD-10-CM | POA: Diagnosis not present

## 2022-10-15 DIAGNOSIS — Z23 Encounter for immunization: Secondary | ICD-10-CM | POA: Diagnosis not present

## 2022-10-15 DIAGNOSIS — D649 Anemia, unspecified: Secondary | ICD-10-CM | POA: Diagnosis not present

## 2022-10-15 DIAGNOSIS — R7301 Impaired fasting glucose: Secondary | ICD-10-CM | POA: Diagnosis not present

## 2023-01-15 NOTE — Progress Notes (Shared)
[facility] - Clinic Note  01/26/2023     CHIEF COMPLAINT Patient presents for No chief complaint on file.  HISTORY OF PRESENT ILLNESS: Martha Richard is a 89 y.o. female who presents to the clinic today for:    Pt is delayed to follow up from January 21, 2022 -- 6+ months, she states she has been taking care of her husband who has dementia, she states her eyelids are blocking her vision, she uses AT's as needed  Referring physician: Estanislado Pandy, MD [address] [place],  [place] [address]  HISTORICAL INFORMATION:   Selected notes from the MEDICAL RECORD NUMBER Referred by Dr. Marcha Solders for concern of cystic IRF OS    CURRENT MEDICATIONS: Current Outpatient Medications (Ophthalmic Drugs)  Medication Sig   hydroxypropyl methylcellulose / hypromellose (ISOPTO TEARS / GONIOVISC) 2.5 % ophthalmic solution Place 1 drop into both eyes 3 (three) times daily as needed for dry eyes.   No current facility-administered medications for this visit. (Ophthalmic Drugs)   Current Outpatient Medications (Other)  Medication Sig   hydrochlorothiazide (HYDRODIURIL) 12.5 MG tablet Take 12.5 mg by mouth daily as needed (salt intake).   Current Facility-Administered Medications (Other)  Medication Route   Bevacizumab (AVASTIN) SOLN 1.25 mg Intravitreal   Bevacizumab (AVASTIN) SOLN 1.25 mg Intravitreal   Bevacizumab (AVASTIN) SOLN 1.25 mg Intravitreal   Bevacizumab (AVASTIN) SOLN 1.25 mg Intravitreal   Bevacizumab (AVASTIN) SOLN 1.25 mg Intravitreal   Bevacizumab (AVASTIN) SOLN 1.25 mg Intravitreal   REVIEW OF SYSTEMS:    ALLERGIES Allergies  Allergen Reactions   Ace Inhibitors Cough   Caffeine Other (See Comments)    "shoots my BP up"   Nsaids Other (See Comments)    GI upset     Peanut-Containing Drug Products Other (See Comments)    headaches   Ultram [Tramadol Hcl] Other (See Comments)    Headaches. Patient is unsure of this.   Penicillins Itching  and Rash    Has patient had a PCN reaction causing immediate rash, facial/tongue/throat swelling, SOB or lightheadedness with hypotension: No Has patient had a PCN reaction causing severe rash involving mucus membranes or skin necrosis: No Has patient had a PCN reaction that required hospitalization No Has patient had a PCN reaction occurring within the last 10 years: No If all of the above answers are "NO", then may proceed with Cephalosporin use.  Tolerated Cephalosporin Date: 01/03/20.     PAST MEDICAL HISTORY Past Medical History:  Diagnosis Date   Aortic stenosis    Arrhythmia    palpitations   Arthritis    Cancer (HCC)    squamous cell Left hand   Complication of anesthesia    slow to wake up   Depression    Lost a son to cancer   Headache    hx of migraines   Hypertension    Hypertensive retinopathy    OU   Left bundle branch block    Past Surgical History:  Procedure Laterality Date   APPENDECTOMY     CATARACT EXTRACTION Bilateral    OU about 10 yrs ago   COLONOSCOPY WITH PROPOFOL N/A 05/01/2020   Surgeon: Lanelle Bal, DO;Nonbleeding internal hemorrhoids, pancolonic diverticulosis, 1 7 mm tubular adenoma resected, single nonbleeding colonic angioectasia treated with APC.  No recommendations to repeat colonoscopy due to age.   EYE SURGERY     laser   fibroadenoma left breast removal     HOT  HEMOSTASIS  05/01/2020   Procedure: HOT HEMOSTASIS (ARGON PLASMA COAGULATION/BICAP);  Surgeon: Lanelle Bal, DO;  Location: [facility];  Service: Endoscopy;;   OTHER SURGICAL HISTORY     appendectomy   POLYPECTOMY  05/01/2020   Procedure: POLYPECTOMY;  Surgeon: Lanelle Bal, DO;  Location: [facility];  Service: Endoscopy;;   TOTAL HIP ARTHROPLASTY Left 06/18/2016   Procedure: LEFT TOTAL HIP ARTHROPLASTY ANTERIOR APPROACH;  Surgeon: Ollen Gross, MD;  Location: [facility];  Service: Orthopedics;  Laterality: Left;   TOTAL HIP ARTHROPLASTY Right 05/05/2018    Procedure: TOTAL HIP ARTHROPLASTY ANTERIOR APPROACH;  Surgeon: Ollen Gross, MD;  Location: [facility];  Service: Orthopedics;  Laterality: Right;   TOTAL KNEE ARTHROPLASTY Right 01/02/2020   Procedure: TOTAL KNEE ARTHROPLASTY;  Surgeon: Ollen Gross, MD;  Location: [facility];  Service: Orthopedics;  Laterality: Right;    UTERINE FIBROID SURGERY     FAMILY HISTORY Family History  Problem Relation Age of Onset   Heart disease Mother    Heart attack Mother    Heart failure Mother    Stroke Father    Heart attack Maternal Aunt    Heart disease Maternal Aunt    Heart failure Maternal Aunt    Amblyopia Neg Hx    Blindness Neg Hx    Cataracts Neg Hx    Glaucoma Neg Hx    Macular degeneration Neg Hx    Retinal detachment Neg Hx    Strabismus Neg Hx    Retinitis pigmentosa Neg Hx    Colon cancer Neg Hx    SOCIAL HISTORY Social History   Tobacco Use   Smoking status: Former    Current packs/day: 0.00    Average packs/day: 0.3 packs/day for 3.0 years (0.8 ttl pk-yrs)    Types: Cigarettes    Start date: 04/27/1961    Quit date: 04/27/1964    Years since quitting: 58.7   Smokeless tobacco: Never   Tobacco comments:    years ago 50 plus years  Vaping Use   Vaping status: Never Used  Substance Use Topics   Alcohol use: No   Drug use: No       OPHTHALMIC EXAM: Not recorded    IMAGING AND PROCEDURES  Imaging and Procedures for 05/25/17          ASSESSMENT/PLAN:    ICD-10-CM   1. Branch retinal vein occlusion of left eye with macular edema  H34.8320     2. Essential hypertension  I10     3. Hypertensive retinopathy of both eyes  H35.033     4. Posterior vitreous detachment of left eye  H43.812     5. 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 Pseudophakia of both eyes  Z96.1     7. PCO (posterior capsular opacification), left  H26.492      1. BRVO w/ macular edema OS  - delayed follow up from 11-12 weeks to 6+ months (Nov. 2023 to June 2024)  - delayed  follow up from 4-5 weeks to 22 months (from Jan 2021 to Nov 2022)  - on presenting exam, superior temporal venule was dilated and tortuous but no significant intraretinal hemorrhages suggesting perhaps a remote BRVO event had occurred  - FA at presentation (01.23.19) shows significant leakage from branches from the superior temporal venules corresponding to central cystic changes noted on OCT  - S/P IVA OS #1 (01.23.19), #2 (02.25.19), #3 (04.01.19), #4 (05.07.19), #5 (06.25.19), #6 (02.21.20), #  7 (09.16.20), #8 (10.21.20), #9 (12.08.20), #10 (01.22.21), #11 (11.21.22), #12 (01.23.23), #13 (03.27.23), #14 (06.12.23), #15 (09.11.23), #16 (11.28.23)  - lost to follow up from February to Sept 2020 and from January 2021 to November 2022  **history of increased IRF/cystic changes at 13 wks, noted on 09.11.23 visit**  - OCT today shows stable improvement in IRF/cystic changes temporal fovea at 6+ months  - BCVA OS improved to 20/30 from 20/40   - recommend holding injection today with follow up in 6 months  - informed consent for IVA OS obtained and signed 11.21.22  - F/U 6 months, sooner prn -- DFE/OCT/possible injection  2,3. Hypertensive retinopathy OU  - discussed importance of tight BP control  - monitor  4. PVD OS  - Discussed findings and prognosis  - No RT or RD on 360 peripheral exam  - Reviewed s/s of RT/RD  - strict return precautions for any such RT/RD symptoms  5. Glaucoma Suspect OS  6,7. Pseudophakia OU  - s/p CE/IOL OU [facility] yrs ago at [facility]  - s/p YAG capsulotomy OU  - OS re-YAG'd by Dr. Alben Spittle -- PC opening improved  - monitor  Ophthalmic Meds Ordered this visit:  No orders of the defined types were placed in this encounter.    No follow-ups on file.  There are no Patient Instructions on file for this visit.   Explained the diagnoses, plan, and follow up with the patient and they expressed understanding.  Patient expressed understanding of the importance of  proper follow up care.   This document serves as a record of services personally performed by Karie Chimera, MD, PhD. It was created on their behalf by Glee Arvin. Manson Passey, OA an ophthalmic technician. The creation of this record is the provider's dictation and/or activities during the visit.    Electronically signed by: Glee Arvin. Manson Passey, OA 01/15/23 2:07 PM   Karie Chimera, M.D., Ph.D. Diseases & Surgery of the Retina and Vitreous [facility]     Abbreviations: M myopia (nearsighted); A astigmatism; H hyperopia (farsighted); P presbyopia; Mrx spectacle prescription;  CTL contact lenses; OD right eye; OS left eye; OU both eyes  XT exotropia; ET esotropia; PEK punctate epithelial keratitis; PEE punctate epithelial erosions; DES dry eye syndrome; MGD meibomian gland dysfunction; ATs artificial tears; PFAT's preservative free artificial tears; NSC nuclear sclerotic cataract; PSC posterior subcapsular cataract; ERM epi-retinal membrane; PVD posterior vitreous detachment; RD retinal detachment; DM diabetes mellitus; DR diabetic retinopathy; NPDR non-proliferative diabetic retinopathy; PDR proliferative diabetic retinopathy; CSME clinically significant macular edema; DME diabetic macular edema; dbh dot blot hemorrhages; CWS cotton wool spot; POAG primary open angle glaucoma; C/D cup-to-disc ratio; HVF humphrey visual field; GVF goldmann visual field; OCT optical coherence tomography; IOP intraocular pressure; BRVO Branch retinal vein occlusion; CRVO central retinal vein occlusion; CRAO central retinal artery occlusion; BRAO branch retinal artery occlusion; RT retinal tear; SB scleral buckle; PPV pars plana vitrectomy; VH Vitreous hemorrhage; PRP panretinal laser photocoagulation; IVK intravitreal kenalog; VMT vitreomacular traction; MH Macular hole;  NVD neovascularization of the disc; NVE neovascularization elsewhere; AREDS age related eye disease study; ARMD age related macular  degeneration; POAG primary open angle glaucoma; EBMD epithelial/anterior basement membrane dystrophy; ACIOL anterior chamber intraocular lens; IOL intraocular lens; PCIOL posterior chamber intraocular lens; Phaco/IOL phacoemulsification with intraocular lens placement; PRK photorefractive keratectomy; LASIK laser assisted in situ keratomileusis; HTN hypertension; DM diabetes mellitus; COPD chronic obstructive pulmonary disease

## 2023-01-26 ENCOUNTER — Encounter (INDEPENDENT_AMBULATORY_CARE_PROVIDER_SITE_OTHER): Payer: Medicare Other | Admitting: Ophthalmology

## 2023-01-26 DIAGNOSIS — H35033 Hypertensive retinopathy, bilateral: Secondary | ICD-10-CM

## 2023-01-26 DIAGNOSIS — I10 Essential (primary) hypertension: Secondary | ICD-10-CM

## 2023-01-26 DIAGNOSIS — H40002 Preglaucoma, unspecified, left eye: Secondary | ICD-10-CM

## 2023-01-26 DIAGNOSIS — H43812 Vitreous degeneration, left eye: Secondary | ICD-10-CM

## 2023-01-26 DIAGNOSIS — H26492 Other secondary cataract, left eye: Secondary | ICD-10-CM

## 2023-01-26 DIAGNOSIS — Z961 Presence of intraocular lens: Secondary | ICD-10-CM

## 2023-01-26 DIAGNOSIS — H34832 Tributary (branch) retinal vein occlusion, left eye, with macular edema: Secondary | ICD-10-CM

## 2023-01-28 DIAGNOSIS — H353222 Exudative age-related macular degeneration, left eye, with inactive choroidal neovascularization: Secondary | ICD-10-CM | POA: Diagnosis not present

## 2023-01-28 DIAGNOSIS — Z961 Presence of intraocular lens: Secondary | ICD-10-CM | POA: Diagnosis not present

## 2023-01-28 DIAGNOSIS — H02831 Dermatochalasis of right upper eyelid: Secondary | ICD-10-CM | POA: Diagnosis not present

## 2023-01-28 DIAGNOSIS — H02834 Dermatochalasis of left upper eyelid: Secondary | ICD-10-CM | POA: Diagnosis not present

## 2023-04-09 DIAGNOSIS — E7849 Other hyperlipidemia: Secondary | ICD-10-CM | POA: Diagnosis not present

## 2023-04-09 DIAGNOSIS — K219 Gastro-esophageal reflux disease without esophagitis: Secondary | ICD-10-CM | POA: Diagnosis not present

## 2023-04-09 DIAGNOSIS — I10 Essential (primary) hypertension: Secondary | ICD-10-CM | POA: Diagnosis not present

## 2023-04-09 DIAGNOSIS — D649 Anemia, unspecified: Secondary | ICD-10-CM | POA: Diagnosis not present

## 2023-04-14 DIAGNOSIS — Z23 Encounter for immunization: Secondary | ICD-10-CM | POA: Diagnosis not present

## 2023-04-14 DIAGNOSIS — R011 Cardiac murmur, unspecified: Secondary | ICD-10-CM | POA: Diagnosis not present

## 2023-04-14 DIAGNOSIS — E782 Mixed hyperlipidemia: Secondary | ICD-10-CM | POA: Diagnosis not present

## 2023-04-14 DIAGNOSIS — D649 Anemia, unspecified: Secondary | ICD-10-CM | POA: Diagnosis not present

## 2023-04-14 DIAGNOSIS — I10 Essential (primary) hypertension: Secondary | ICD-10-CM | POA: Diagnosis not present

## 2023-04-21 NOTE — Progress Notes (Signed)
 [facility] - Clinic Note  04/22/2023     CHIEF COMPLAINT Patient presents for Retina Follow Up  HISTORY OF PRESENT ILLNESS: Martha Richard is a 89 y.o. female who presents to the clinic today for:   HPI     Retina Follow Up   Patient presents with  CRVO/BRVO.  In left eye.  This started 8 months ago.  Duration of 8 months.  Since onset it is stable.  I, the attending physician,  performed the HPI with the patient and updated documentation appropriately.        Comments   8 month retina follow up BRVO OS pt is reporting no vision changes notched she denies any flashes or floaters       Last edited by Rennis Chris, MD on 04/23/2023  5:19 PM.    Pt states she felt like her vision was improving for awhile, but she feels like it has stopped now, she feels like her eyelids are drooping and blocking her vision, no new health concerns  Referring physician: Estanislado Pandy, MD [address] [place],  [place] [address]  HISTORICAL INFORMATION:   Selected notes from the MEDICAL RECORD NUMBER Referred by Dr. Marcha Solders for concern of cystic IRF OS    CURRENT MEDICATIONS: Current Outpatient Medications (Ophthalmic Drugs)  Medication Sig   hydroxypropyl methylcellulose / hypromellose (ISOPTO TEARS / GONIOVISC) 2.5 % ophthalmic solution Place 1 drop into both eyes 3 (three) times daily as needed for dry eyes.   No current facility-administered medications for this visit. (Ophthalmic Drugs)   Current Outpatient Medications (Other)  Medication Sig   hydrochlorothiazide (HYDRODIURIL) 12.5 MG tablet Take 12.5 mg by mouth daily as needed (salt intake).   Current Facility-Administered Medications (Other)  Medication Route   Bevacizumab (AVASTIN) SOLN 1.25 mg Intravitreal   Bevacizumab (AVASTIN) SOLN 1.25 mg Intravitreal   Bevacizumab (AVASTIN) SOLN 1.25 mg Intravitreal   Bevacizumab (AVASTIN) SOLN 1.25 mg Intravitreal   Bevacizumab (AVASTIN) SOLN 1.25 mg  Intravitreal   Bevacizumab (AVASTIN) SOLN 1.25 mg Intravitreal   REVIEW OF SYSTEMS: ROS   Positive for: Musculoskeletal, Cardiovascular, Eyes Negative for: Constitutional, Gastrointestinal, Neurological, Skin, Genitourinary, HENT, Endocrine, Respiratory, Psychiatric, Allergic/Imm, Heme/Lymph Last edited by Etheleen Mayhew, COT on 04/22/2023 12:37 PM.     ALLERGIES Allergies  Allergen Reactions   Ace Inhibitors Cough   Caffeine Other (See Comments)    "shoots my BP up"   Nsaids Other (See Comments)    GI upset     Peanut-Containing Drug Products Other (See Comments)    headaches   Ultram [Tramadol Hcl] Other (See Comments)    Headaches. Patient is unsure of this.   Penicillins Itching and Rash    Has patient had a PCN reaction causing immediate rash, facial/tongue/throat swelling, SOB or lightheadedness with hypotension: No Has patient had a PCN reaction causing severe rash involving mucus membranes or skin necrosis: No Has patient had a PCN reaction that required hospitalization No Has patient had a PCN reaction occurring within the last 10 years: No If all of the above answers are "NO", then may proceed with Cephalosporin use.  Tolerated Cephalosporin Date: 01/03/20.     PAST MEDICAL HISTORY Past Medical History:  Diagnosis Date   Aortic stenosis    Arrhythmia    palpitations   Arthritis    Cancer (HCC)    squamous cell Left hand   Complication of anesthesia    slow to wake  up   Depression    Lost a son to cancer   Headache    hx of migraines   Hypertension    Hypertensive retinopathy    OU   Left bundle branch block    Past Surgical History:  Procedure Laterality Date   APPENDECTOMY     CATARACT EXTRACTION Bilateral    OU about 10 yrs ago   COLONOSCOPY WITH PROPOFOL N/A 05/01/2020   Surgeon: Lanelle Bal, DO;Nonbleeding internal hemorrhoids, pancolonic diverticulosis, 1 7 mm tubular adenoma resected, single nonbleeding colonic angioectasia  treated with APC.  No recommendations to repeat colonoscopy due to age.   EYE SURGERY     laser   fibroadenoma left breast removal     HOT HEMOSTASIS  05/01/2020   Procedure: HOT HEMOSTASIS (ARGON PLASMA COAGULATION/BICAP);  Surgeon: Lanelle Bal, DO;  Location: [facility];  Service: Endoscopy;;   OTHER SURGICAL HISTORY     appendectomy   POLYPECTOMY  05/01/2020   Procedure: POLYPECTOMY;  Surgeon: Lanelle Bal, DO;  Location: [facility];  Service: Endoscopy;;   TOTAL HIP ARTHROPLASTY Left 06/18/2016   Procedure: LEFT TOTAL HIP ARTHROPLASTY ANTERIOR APPROACH;  Surgeon: Ollen Gross, MD;  Location: [facility];  Service: Orthopedics;  Laterality: Left;   TOTAL HIP ARTHROPLASTY Right 05/05/2018   Procedure: TOTAL HIP ARTHROPLASTY ANTERIOR APPROACH;  Surgeon: Ollen Gross, MD;  Location: [facility];  Service: Orthopedics;  Laterality: Right;   TOTAL KNEE ARTHROPLASTY Right 01/02/2020   Procedure: TOTAL KNEE ARTHROPLASTY;  Surgeon: Ollen Gross, MD;  Location: [facility];  Service: Orthopedics;  Laterality: Right;    UTERINE FIBROID SURGERY     FAMILY HISTORY Family History  Problem Relation Age of Onset   Heart disease Mother    Heart attack Mother    Heart failure Mother    Stroke Father    Heart attack Maternal Aunt    Heart disease Maternal Aunt    Heart failure Maternal Aunt    Amblyopia Neg Hx    Blindness Neg Hx    Cataracts Neg Hx    Glaucoma Neg Hx    Macular degeneration Neg Hx    Retinal detachment Neg Hx    Strabismus Neg Hx    Retinitis pigmentosa Neg Hx    Colon cancer Neg Hx    SOCIAL HISTORY Social History   Tobacco Use   Smoking status: Former    Current packs/day: 0.00    Average packs/day: 0.3 packs/day for 3.0 years (0.8 ttl pk-yrs)    Types: Cigarettes    Start date: 04/27/1961    Quit date: 04/27/1964    Years since quitting: 59.0   Smokeless tobacco: Never   Tobacco comments:    years ago 50 plus years  Vaping Use   Vaping status: Never  Used  Substance Use Topics   Alcohol use: No   Drug use: No       OPHTHALMIC EXAM: Base Eye Exam     Visual Acuity (Snellen - Linear)       Right Left   Dist cc 20/20 20/30 -2    Correction: Glasses         Tonometry (Tonopen, 12:41 PM)       Right Left   Pressure 16 16         Pupils       Pupils Dark Light Shape React APD   Right PERRL 2 1 Round Brisk None   Left PERRL 2 1 Round Brisk  None         Visual Fields       Left Right    Full Full         Extraocular Movement       Right Left    Full, Ortho Full, Ortho         Neuro/Psych     Oriented x3: Yes   Mood/Affect: Normal         Dilation     Both eyes: 2.5% Phenylephrine @ 12:41 PM           Slit Lamp and Fundus Exam     Slit Lamp Exam       Right Left   Lids/Lashes Dermatochalasis - upper lid Dermatochalasis with hooding temporally   Conjunctiva/Sclera White and quiet White and quiet   Cornea Arcus, tear film debris, Well healed cataract wounds, nasal and temporal LRI, 1+ Punctate epithelial erosions Mild arcus, 1+Punctate epithelial erosions inferiorly, nasal and temporal LRI, mild tear film debris   Anterior Chamber deep and clear deep and clear   Iris Round and moderately dilated Round and moderately dilated   Lens PC IOL in good position with open PC PC IOL in good position, open PC   Anterior Vitreous mild syneresis Vitreous syneresis, Posterior vitreous detachment         Fundus Exam       Right Left   Disc +hyperemia at 0430, Pink and Sharp Tilted, +collateral vessels temporally, vascular loops, inferior rim thinning, mild pallor, Sharp rim, +cupping   C/D Ratio 0.5 0.7   Macula Good foveal reflex, Retinal pigment epithelial mottling, No heme or edema good foveal reflex; central cystic changes - stably improved, fine exudate superior macula -- stably improved, trace ERM, no heme   Vessels attenuated, Tortuous attenuated, Tortuous, mild AV crossing changes    Periphery Attached, No heme Attached, No heme           Refraction     Wearing Rx       Sphere Cylinder Axis Add   Right +1.50 +1.50 014 +2.50   Left +2.00 +1.00 178 +2.50           IMAGING AND PROCEDURES  Imaging and Procedures for 05/25/17  OCT, Retina - OU - Both Eyes       Right Eye Quality was borderline. Central Foveal Thickness: 264. Progression has been stable. Findings include normal foveal contour, no IRF, no SRF, epiretinal membrane (stable improvement in IRF ).   Left Eye Quality was good. Central Foveal Thickness: 247. Progression has been stable. Findings include normal foveal contour, no IRF, no SRF, intraretinal hyper-reflective material, epiretinal membrane (Stable improvement in IRF/cystic changes temporal fovea ).   Notes *Images captured and stored on drive  Diagnosis / Impression:  OD: stable improvement in IRF  OS: BRVO w/ CME OS -- stable improvement in IRF/cystic changes temporal fovea    Clinical management:  See below  Abbreviations: NFP - Normal foveal profile. CME - cystoid macular edema. PED - pigment epithelial detachment. IRF - intraretinal fluid. SRF - subretinal fluid. EZ - ellipsoid zone. ERM - epiretinal membrane. ORA - outer retinal atrophy. ORT - outer retinal tubulation. SRHM - subretinal hyper-reflective material             ASSESSMENT/PLAN:    ICD-10-CM   1. Stable branch retinal vein occlusion of left eye  H34.8322 OCT, Retina - OU - Both Eyes    2. Essential hypertension  I10  3. Hypertensive retinopathy of both eyes  H35.033     4. Posterior vitreous detachment of left eye  H43.812     5. 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 Pseudophakia of both eyes  Z96.1     7. PCO (posterior capsular opacification), left  H26.492     8. Dermatochalasis of both upper eyelids  H02.831    H02.834      1. BRVO w/ macular edema OS  - delayed follow up from 11-12 weeks to 6+ months (Nov. 2023 to June 2024)  -  delayed follow up from 4-5 weeks to 22 months (from Jan 2021 to Nov 2022)  - on presenting exam, superior temporal venule was dilated and tortuous but no significant intraretinal hemorrhages suggesting perhaps a remote BRVO event had occurred  - FA at presentation (01.23.19) shows significant leakage from branches from the superior temporal venules corresponding to central cystic changes noted on OCT  - S/P IVA OS #1 (01.23.19), #2 (02.25.19), #3 (04.01.19), #4 (05.07.19), #5 (06.25.19), #6 (02.21.20), #7 (09.16.20), #8 (10.21.20), #9 (12.08.20), #10 (01.22.21), #11 (11.21.22), #12 (01.23.23), #13 (03.27.23), #14 (06.12.23), #15 (09.11.23), #16 (11.28.23)  - lost to follow up from February to Sept 2020 and from January 2021 to November 2022  **history of increased IRF/cystic changes at 13 wks, noted on 09.11.23 visit**  - OCT today shows stable improvement in IRF/cystic changes temporal fovea at 15 months since last injxn  - BCVA OS 20/30 -- stable  - recommend holding injection today with follow up in 6-9 months  - F/U 6-9 months, sooner prn -- DFE/OCT/possible injection  2,3. Hypertensive retinopathy OU  - discussed importance of tight BP control  - monitor  4. PVD OS  - Discussed findings and prognosis  - No RT or RD on 360 peripheral exam  - Reviewed s/s of RT/RD  - strict return precautions for any such RT/RD symptoms  5. Glaucoma Suspect OS  - IOP 16  6,7. Pseudophakia OU  - s/p CE/IOL OU 11 yrs ago at [facility]  - s/p YAG capsulotomy OU  - OS re-YAG'd by Dr. Alben Spittle -- PC opening improved  - monitor  8. Dermatochalasis OU  - pt reports drooping upper lids are impeding her vision (OS > OD)  - pt requests referral to Oculoplastics -- will refer to [facility]  Ophthalmic Meds Ordered this visit:  No orders of the defined types were placed in this encounter.    Return for f/u 6-9 months, BRVO OS, DFE, OCT.  There are no Patient Instructions on file for this  visit.   Explained the diagnoses, plan, and follow up with the patient and they expressed understanding.  Patient expressed understanding of the importance of proper follow up care.   This document serves as a record of services personally performed by Karie Chimera, MD, PhD. It was created on their behalf by Glee Arvin. Manson Passey, OA an ophthalmic technician. The creation of this record is the provider's dictation and/or activities during the visit.    Electronically signed by: Glee Arvin. Manson Passey, OA 04/23/23 5:21 PM   Karie Chimera, M.D., Ph.D. Diseases & Surgery of the Retina and Vitreous [facility]  I have reviewed the above documentation for accuracy and completeness, and I agree with the above. Karie Chimera, M.D., Ph.D. 04/23/23 5:23 PM  Abbreviations: M myopia (nearsighted); A astigmatism; H hyperopia (farsighted); P presbyopia; Mrx spectacle prescription;  CTL  contact lenses; OD right eye; OS left eye; OU both eyes  XT exotropia; ET esotropia; PEK punctate epithelial keratitis; PEE punctate epithelial erosions; DES dry eye syndrome; MGD meibomian gland dysfunction; ATs artificial tears; PFAT's preservative free artificial tears; NSC nuclear sclerotic cataract; PSC posterior subcapsular cataract; ERM epi-retinal membrane; PVD posterior vitreous detachment; RD retinal detachment; DM diabetes mellitus; DR diabetic retinopathy; NPDR non-proliferative diabetic retinopathy; PDR proliferative diabetic retinopathy; CSME clinically significant macular edema; DME diabetic macular edema; dbh dot blot hemorrhages; CWS cotton wool spot; POAG primary open angle glaucoma; C/D cup-to-disc ratio; HVF humphrey visual field; GVF goldmann visual field; OCT optical coherence tomography; IOP intraocular pressure; BRVO Branch retinal vein occlusion; CRVO central retinal vein occlusion; CRAO central retinal artery occlusion; BRAO branch retinal artery occlusion; RT retinal tear; SB scleral  buckle; PPV pars plana vitrectomy; VH Vitreous hemorrhage; PRP panretinal laser photocoagulation; IVK intravitreal kenalog; VMT vitreomacular traction; MH Macular hole;  NVD neovascularization of the disc; NVE neovascularization elsewhere; AREDS age related eye disease study; ARMD age related macular degeneration; POAG primary open angle glaucoma; EBMD epithelial/anterior basement membrane dystrophy; ACIOL anterior chamber intraocular lens; IOL intraocular lens; PCIOL posterior chamber intraocular lens; Phaco/IOL phacoemulsification with intraocular lens placement; PRK photorefractive keratectomy; LASIK laser assisted in situ keratomileusis; HTN hypertension; DM diabetes mellitus; COPD chronic obstructive pulmonary disease

## 2023-04-22 ENCOUNTER — Ambulatory Visit (INDEPENDENT_AMBULATORY_CARE_PROVIDER_SITE_OTHER): Payer: Medicare Other | Admitting: Ophthalmology

## 2023-04-22 ENCOUNTER — Encounter (INDEPENDENT_AMBULATORY_CARE_PROVIDER_SITE_OTHER): Payer: Self-pay | Admitting: Ophthalmology

## 2023-04-22 DIAGNOSIS — H35033 Hypertensive retinopathy, bilateral: Secondary | ICD-10-CM

## 2023-04-22 DIAGNOSIS — H26492 Other secondary cataract, left eye: Secondary | ICD-10-CM | POA: Diagnosis not present

## 2023-04-22 DIAGNOSIS — I10 Essential (primary) hypertension: Secondary | ICD-10-CM | POA: Diagnosis not present

## 2023-04-22 DIAGNOSIS — H02831 Dermatochalasis of right upper eyelid: Secondary | ICD-10-CM | POA: Diagnosis not present

## 2023-04-22 DIAGNOSIS — H40002 Preglaucoma, unspecified, left eye: Secondary | ICD-10-CM

## 2023-04-22 DIAGNOSIS — H02834 Dermatochalasis of left upper eyelid: Secondary | ICD-10-CM

## 2023-04-22 DIAGNOSIS — Z961 Presence of intraocular lens: Secondary | ICD-10-CM

## 2023-04-22 DIAGNOSIS — H34832 Tributary (branch) retinal vein occlusion, left eye, with macular edema: Secondary | ICD-10-CM

## 2023-04-22 DIAGNOSIS — H348322 Tributary (branch) retinal vein occlusion, left eye, stable: Secondary | ICD-10-CM

## 2023-04-22 DIAGNOSIS — H43812 Vitreous degeneration, left eye: Secondary | ICD-10-CM

## 2023-04-23 ENCOUNTER — Encounter (INDEPENDENT_AMBULATORY_CARE_PROVIDER_SITE_OTHER): Payer: Self-pay | Admitting: Ophthalmology

## 2023-10-12 DIAGNOSIS — Z0001 Encounter for general adult medical examination with abnormal findings: Secondary | ICD-10-CM | POA: Diagnosis not present

## 2023-10-12 DIAGNOSIS — Z0000 Encounter for general adult medical examination without abnormal findings: Secondary | ICD-10-CM | POA: Diagnosis not present

## 2023-10-12 DIAGNOSIS — K2100 Gastro-esophageal reflux disease with esophagitis, without bleeding: Secondary | ICD-10-CM | POA: Diagnosis not present

## 2023-10-12 DIAGNOSIS — I10 Essential (primary) hypertension: Secondary | ICD-10-CM | POA: Diagnosis not present

## 2023-10-12 DIAGNOSIS — E782 Mixed hyperlipidemia: Secondary | ICD-10-CM | POA: Diagnosis not present

## 2023-10-12 DIAGNOSIS — E7849 Other hyperlipidemia: Secondary | ICD-10-CM | POA: Diagnosis not present

## 2023-10-12 DIAGNOSIS — Z1389 Encounter for screening for other disorder: Secondary | ICD-10-CM | POA: Diagnosis not present

## 2023-10-15 NOTE — Progress Notes (Signed)
 [facility] - Clinic Note  10/21/2023     CHIEF COMPLAINT Patient presents for Retina Follow Up  HISTORY OF PRESENT ILLNESS: Martha Richard is a 89 y.o. female who presents to the clinic today for:   HPI     Retina Follow Up   Patient presents with  CRVO/BRVO.  In left eye.  This started 6 months ago.  Duration of 6 months.  Since onset it is stable.  I, the attending physician,  performed the HPI with the patient and updated documentation appropriately.        Comments   6 month retina follow up BRVO OS pt is reporting no vision changes noticed she does thing her lids effect her vision pt denies any flashes or floaters       Last edited by Valdemar Rogue, MD on 10/21/2023  6:25 PM.     Pt states her husband is in the nursing home. She states her eyelids are drooping.  Referring physician: Atilano Deward ORN, MD [address] [place],  [place] [address]  HISTORICAL INFORMATION:   Selected notes from the MEDICAL RECORD NUMBER Referred by Dr. KYM Ferrier for concern of cystic IRF OS    CURRENT MEDICATIONS: Current Outpatient Medications (Ophthalmic Drugs)  Medication Sig   hydroxypropyl methylcellulose / hypromellose (ISOPTO TEARS / GONIOVISC) 2.5 % ophthalmic solution Place 1 drop into both eyes 3 (three) times daily as needed for dry eyes.   No current facility-administered medications for this visit. (Ophthalmic Drugs)   Current Outpatient Medications (Other)  Medication Sig   hydrochlorothiazide  (HYDRODIURIL ) 12.5 MG tablet Take 12.5 mg by mouth daily as needed (salt intake).   Current Facility-Administered Medications (Other)  Medication Route   Bevacizumab  (AVASTIN ) SOLN 1.25 mg Intravitreal   Bevacizumab  (AVASTIN ) SOLN 1.25 mg Intravitreal   Bevacizumab  (AVASTIN ) SOLN 1.25 mg Intravitreal   Bevacizumab  (AVASTIN ) SOLN 1.25 mg Intravitreal   Bevacizumab  (AVASTIN ) SOLN 1.25 mg Intravitreal   Bevacizumab  (AVASTIN ) SOLN 1.25 mg Intravitreal    REVIEW OF SYSTEMS: ROS   Positive for: Musculoskeletal, Cardiovascular, Eyes Negative for: Constitutional, Gastrointestinal, Neurological, Skin, Genitourinary, HENT, Endocrine, Respiratory, Psychiatric, Allergic/Imm, Heme/Lymph Last edited by Resa Delon ORN, COT on 10/21/2023 12:54 PM.      ALLERGIES Allergies  Allergen Reactions   Ace Inhibitors Cough   Caffeine Other (See Comments)    shoots my BP up   Nsaids Other (See Comments)    GI upset     Peanut-Containing Drug Products Other (See Comments)    headaches   Ultram [Tramadol Hcl] Other (See Comments)    Headaches. Patient is unsure of this.   Penicillins Itching and Rash    Has patient had a PCN reaction causing immediate rash, facial/tongue/throat swelling, SOB or lightheadedness with hypotension: No Has patient had a PCN reaction causing severe rash involving mucus membranes or skin necrosis: No Has patient had a PCN reaction that required hospitalization No Has patient had a PCN reaction occurring within the last 10 years: No If all of the above answers are NO, then may proceed with Cephalosporin use.  Tolerated Cephalosporin Date: 01/03/20.     PAST MEDICAL HISTORY Past Medical History:  Diagnosis Date   Aortic stenosis    Arrhythmia    palpitations   Arthritis    Cancer (HCC)    squamous cell Left hand   Complication of anesthesia    slow to wake up   Depression    Lost a  son to cancer   Headache    hx of migraines   Hypertension    Hypertensive retinopathy    OU   Left bundle branch block    Past Surgical History:  Procedure Laterality Date   APPENDECTOMY     CATARACT EXTRACTION Bilateral    OU about 10 yrs ago   COLONOSCOPY WITH PROPOFOL  N/A 05/01/2020   Surgeon: Cindie Carlin POUR, DO;Nonbleeding internal hemorrhoids, pancolonic diverticulosis, 1 7 mm tubular adenoma resected, single nonbleeding colonic angioectasia treated with APC.  No recommendations to repeat colonoscopy due to  age.   EYE SURGERY     laser   fibroadenoma left breast removal     HOT HEMOSTASIS  05/01/2020   Procedure: HOT HEMOSTASIS (ARGON PLASMA COAGULATION/BICAP);  Surgeon: Cindie Carlin POUR, DO;  Location: [facility];  Service: Endoscopy;;   OTHER SURGICAL HISTORY     appendectomy   POLYPECTOMY  05/01/2020   Procedure: POLYPECTOMY;  Surgeon: Cindie Carlin POUR, DO;  Location: [facility];  Service: Endoscopy;;   TOTAL HIP ARTHROPLASTY Left 06/18/2016   Procedure: LEFT TOTAL HIP ARTHROPLASTY ANTERIOR APPROACH;  Surgeon: Dempsey Moan, MD;  Location: [facility];  Service: Orthopedics;  Laterality: Left;   TOTAL HIP ARTHROPLASTY Right 05/05/2018   Procedure: TOTAL HIP ARTHROPLASTY ANTERIOR APPROACH;  Surgeon: Moan Dempsey, MD;  Location: [facility];  Service: Orthopedics;  Laterality: Right;   TOTAL KNEE ARTHROPLASTY Right 01/02/2020   Procedure: TOTAL KNEE ARTHROPLASTY;  Surgeon: Moan Dempsey, MD;  Location: [facility];  Service: Orthopedics;  Laterality: Right;    UTERINE FIBROID SURGERY     FAMILY HISTORY Family History  Problem Relation Age of Onset   Heart disease Mother    Heart attack Mother    Heart failure Mother    Stroke Father    Heart attack Maternal Aunt    Heart disease Maternal Aunt    Heart failure Maternal Aunt    Amblyopia Neg Hx    Blindness Neg Hx    Cataracts Neg Hx    Glaucoma Neg Hx    Macular degeneration Neg Hx    Retinal detachment Neg Hx    Strabismus Neg Hx    Retinitis pigmentosa Neg Hx    Colon cancer Neg Hx    SOCIAL HISTORY Social History   Tobacco Use   Smoking status: Former    Current packs/day: 0.00    Average packs/day: 0.3 packs/day for 3.0 years (0.8 ttl pk-yrs)    Types: Cigarettes    Start date: 04/27/1961    Quit date: 04/27/1964    Years since quitting: 59.5   Smokeless tobacco: Never   Tobacco comments:    years ago 50 plus years  Vaping Use   Vaping status: Never Used  Substance Use Topics   Alcohol  use: No   Drug use: No        OPHTHALMIC EXAM: Base Eye Exam     Visual Acuity (Snellen - Linear)       Right Left   Dist cc 20/20 20/40 -2   Dist ph cc  NI         Tonometry (Tonopen, 12:57 PM)       Right Left   Pressure 17 18         Pupils       Pupils Dark Light Shape React APD   Right PERRL 2 1 Round Brisk None   Left PERRL 2 1 Round Brisk None  Visual Fields       Left Right    Full Full         Extraocular Movement       Right Left    Full Full         Neuro/Psych     Oriented x3: Yes   Mood/Affect: Normal         Dilation     Both eyes: 2.5% Phenylephrine  @ 12:57 PM           Slit Lamp and Fundus Exam     Slit Lamp Exam       Right Left   Lids/Lashes Dermatochalasis - upper lid Dermatochalasis with hooding temporally   Conjunctiva/Sclera White and quiet White and quiet   Cornea Arcus, tear film debris, Well healed cataract wounds, nasal and temporal LRI, 1+ Punctate epithelial erosions Mild arcus, 1+Punctate epithelial erosions inferiorly, nasal and temporal LRI, mild tear film debris   Anterior Chamber deep and clear deep and clear   Iris Round and moderately dilated Round and moderately dilated   Lens PC IOL in good position with open PC PC IOL in good position, open PC   Anterior Vitreous mild syneresis Vitreous syneresis, Posterior vitreous detachment         Fundus Exam       Right Left   Disc +hyperemia at 0430, Pink and Sharp Tilted, +collateral vessels temporally, vascular loops, inferior rim thinning, mild pallor, Sharp rim, +cupping   C/D Ratio 0.5 0.8   Macula Good foveal reflex, Retinal pigment epithelial mottling, No heme or edema good foveal reflex; trace central cystic changes temporal fovea increased, no exudate   Vessels attenuated, Tortuous attenuated, Tortuous, mild AV crossing changes   Periphery Attached, No heme Attached, No heme           Refraction     Wearing Rx       Sphere Cylinder Axis Add   Right  +1.50 +1.50 014 +2.50   Left +2.00 +1.00 178 +2.50           IMAGING AND PROCEDURES  Imaging and Procedures for 05/25/17  OCT, Retina - OU - Both Eyes       Right Eye Quality was borderline. Central Foveal Thickness: 264. Progression has been stable. Findings include normal foveal contour, no IRF, no SRF, epiretinal membrane (stable improvement in IRF ).   Left Eye Quality was good. Central Foveal Thickness: 249. Progression has worsened. Findings include normal foveal contour, no IRF, no SRF, intraretinal hyper-reflective material, epiretinal membrane (Trace focal IRF/cystic changes temporal fovea ).   Notes *Images captured and stored on drive  Diagnosis / Impression:  OD: stable improvement in IRF  OS: BRVO w/ CME OS -- Trace focal IRF/cystic changes temporal fovea     Clinical management:  See below  Abbreviations: NFP - Normal foveal profile. CME - cystoid macular edema. PED - pigment epithelial detachment. IRF - intraretinal fluid. SRF - subretinal fluid. EZ - ellipsoid zone. ERM - epiretinal membrane. ORA - outer retinal atrophy. ORT - outer retinal tubulation. SRHM - subretinal hyper-reflective material       Intravitreal Injection, Pharmacologic Agent - OS - Left Eye       Time Out 10/21/2023. 1:23 PM. Confirmed correct patient, procedure, site, and patient consented.   Anesthesia Topical anesthesia was used. Anesthetic medications included Lidocaine  2%, Proparacaine 0.5%.   Procedure Preparation included 5% betadine  to ocular surface, eyelid speculum. A (32g) needle was used.   Injection:  1.25 mg Bevacizumab  1.25mg /0.45ml   Route: Intravitreal, Site: Left Eye   NDC: [id number], Lot: [id number], Expiration date: 11/26/2023   Post-op Post injection exam found visual acuity of at least counting fingers. The patient tolerated the procedure well. There were no complications. The patient received written and verbal post procedure care education. Post injection  medications were not given.            ASSESSMENT/PLAN:    ICD-10-CM   1. Branch retinal vein occlusion of left eye with macular edema  H34.8320 OCT, Retina - OU - Both Eyes    Intravitreal Injection, Pharmacologic Agent - OS - Left Eye    Bevacizumab  (AVASTIN ) SOLN 1.25 mg    2. Essential hypertension  I10     3. Hypertensive retinopathy of both eyes  H35.033     4. Posterior vitreous detachment of left eye  H43.812     5. 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 Pseudophakia of both eyes  Z96.1     7. PCO (posterior capsular opacification), left  H26.492     8. Dermatochalasis of both upper eyelids  H02.831    H02.834      1. BRVO w/ macular edema OS - delayed follow up from 11-12 weeks to 6+ months (Nov. 2023 to June 2024) - delayed follow up from 4-5 weeks to 22 months (from Jan 2021 to Nov 2022) - on presenting exam, superior temporal venule was dilated and tortuous but no significant intraretinal hemorrhages suggesting perhaps a remote BRVO event had occurred - FA at presentation (01.23.19) shows significant leakage from branches from the superior temporal venules corresponding to central cystic changes noted on OCT - S/P IVA OS #1 (01.23.19), #2 (02.25.19), #3 (04.01.19), #4 (05.07.19), #5 (06.25.19), #6 (02.21.20), #7 (09.16.20), #8 (10.21.20), #9 (12.08.20), #10 (01.22.21), #11 (11.21.22), #12 (01.23.23), #13 (03.27.23), #14 (06.12.23), #15 (09.11.23), #16 (11.28.23) - lost to follow up from February to Sept 2020 and from January 2021 to November 2022 **history of increased IRF/cystic changes at 13 wks, noted on 09.11.23 visit** - OCT today shows Trace focal IRF/cystic changes temporal fovea at 21 months since last injxn  - BCVA OS 20/40 from 2030  - recommend IVA OS # 17 today, 08.27.25 with follow up in 6 weeks  - pt wishes to proceed with injection OS - RBA of procedure discussed, questions answered   - Avastin  consent obtained, signed, and scanned  08.27.25 - see procedure note  - F/U 6 weeks, sooner prn -- DFE/OCT/possible injection  2,3. Hypertensive retinopathy OU  - discussed importance of tight BP control  - monitor  4. PVD OS  - Discussed findings and prognosis  - No RT or RD on 360 peripheral exam  - Reviewed s/s of RT/RD  - strict return precautions for any such RT/RD symptoms  5. Glaucoma Suspect OS  - IOP 18  6,7. Pseudophakia OU  - s/p CE/IOL OU 11 yrs ago at [facility]  - s/p YAG capsulotomy OU  - OS re-YAG'd by Dr. Lelon -- PC opening improved  - monitor  8. Dermatochalasis OU  - pt reports drooping upper lids are impeding her vision (OS > OD)  - pt requests referral to Oculoplastics -- will refer to [facility]  Ophthalmic Meds Ordered this visit:  Meds ordered this encounter  Medications   Bevacizumab  (AVASTIN ) SOLN 1.25 mg     Return in about 6 weeks (around 12/02/2023) for f/u BRVO OS, DFE, OCT,  Possible, IVA, OS.  There are no Patient Instructions on file for this visit.   Explained the diagnoses, plan, and follow up with the patient and they expressed understanding.  Patient expressed understanding of the importance of proper follow up care.   This document serves as a record of services personally performed by Redell JUDITHANN Hans, MD, PhD. It was created on their behalf by Almetta Pesa, an ophthalmic technician. The creation of this record is the provider's dictation and/or activities during the visit.    Electronically signed by: Almetta Pesa, OA, 10/21/23  6:27 PM  This document serves as a record of services personally performed by Redell JUDITHANN Hans, MD, PhD. It was created on their behalf by Wanda GEANNIE Keens, COT an ophthalmic technician. The creation of this record is the provider's dictation and/or activities during the visit.    Electronically signed by:  Wanda GEANNIE Keens, COT  10/21/23 6:27 PM  Redell JUDITHANN Hans, M.D., Ph.D. Diseases & Surgery of the Retina and  Vitreous [facility]  I have reviewed the above documentation for accuracy and completeness, and I agree with the above. Redell JUDITHANN Hans, M.D., Ph.D. 10/21/23 6:30 PM   Abbreviations: M myopia (nearsighted); A astigmatism; H hyperopia (farsighted); P presbyopia; Mrx spectacle prescription;  CTL contact lenses; OD right eye; OS left eye; OU both eyes  XT exotropia; ET esotropia; PEK punctate epithelial keratitis; PEE punctate epithelial erosions; DES dry eye syndrome; MGD meibomian gland dysfunction; ATs artificial tears; PFAT's preservative free artificial tears; NSC nuclear sclerotic cataract; PSC posterior subcapsular cataract; ERM epi-retinal membrane; PVD posterior vitreous detachment; RD retinal detachment; DM diabetes mellitus; DR diabetic retinopathy; NPDR non-proliferative diabetic retinopathy; PDR proliferative diabetic retinopathy; CSME clinically significant macular edema; DME diabetic macular edema; dbh dot blot hemorrhages; CWS cotton wool spot; POAG primary open angle glaucoma; C/D cup-to-disc ratio; HVF humphrey visual field; GVF goldmann visual field; OCT optical coherence tomography; IOP intraocular pressure; BRVO Branch retinal vein occlusion; CRVO central retinal vein occlusion; CRAO central retinal artery occlusion; BRAO branch retinal artery occlusion; RT retinal tear; SB scleral buckle; PPV pars plana vitrectomy; VH Vitreous hemorrhage; PRP panretinal laser photocoagulation; IVK intravitreal kenalog; VMT vitreomacular traction; MH Macular hole;  NVD neovascularization of the disc; NVE neovascularization elsewhere; AREDS age related eye disease study; ARMD age related macular degeneration; POAG primary open angle glaucoma; EBMD epithelial/anterior basement membrane dystrophy; ACIOL anterior chamber intraocular lens; IOL intraocular lens; PCIOL posterior chamber intraocular lens; Phaco/IOL phacoemulsification with intraocular lens placement; PRK photorefractive  keratectomy; LASIK laser assisted in situ keratomileusis; HTN hypertension; DM diabetes mellitus; COPD chronic obstructive pulmonary disease

## 2023-10-21 ENCOUNTER — Encounter (INDEPENDENT_AMBULATORY_CARE_PROVIDER_SITE_OTHER): Payer: Self-pay | Admitting: Ophthalmology

## 2023-10-21 ENCOUNTER — Ambulatory Visit (INDEPENDENT_AMBULATORY_CARE_PROVIDER_SITE_OTHER): Payer: Medicare Other | Admitting: Ophthalmology

## 2023-10-21 DIAGNOSIS — H02834 Dermatochalasis of left upper eyelid: Secondary | ICD-10-CM

## 2023-10-21 DIAGNOSIS — H348322 Tributary (branch) retinal vein occlusion, left eye, stable: Secondary | ICD-10-CM

## 2023-10-21 DIAGNOSIS — H43812 Vitreous degeneration, left eye: Secondary | ICD-10-CM

## 2023-10-21 DIAGNOSIS — I10 Essential (primary) hypertension: Secondary | ICD-10-CM

## 2023-10-21 DIAGNOSIS — H26492 Other secondary cataract, left eye: Secondary | ICD-10-CM | POA: Diagnosis not present

## 2023-10-21 DIAGNOSIS — H34832 Tributary (branch) retinal vein occlusion, left eye, with macular edema: Secondary | ICD-10-CM | POA: Diagnosis not present

## 2023-10-21 DIAGNOSIS — H02831 Dermatochalasis of right upper eyelid: Secondary | ICD-10-CM

## 2023-10-21 DIAGNOSIS — Z961 Presence of intraocular lens: Secondary | ICD-10-CM | POA: Diagnosis not present

## 2023-10-21 DIAGNOSIS — H40002 Preglaucoma, unspecified, left eye: Secondary | ICD-10-CM | POA: Diagnosis not present

## 2023-10-21 DIAGNOSIS — H35033 Hypertensive retinopathy, bilateral: Secondary | ICD-10-CM | POA: Diagnosis not present

## 2023-10-21 MED ORDER — BEVACIZUMAB CHEMO INJECTION 1.25MG/0.05ML SYRINGE FOR KALEIDOSCOPE
1.2500 mg | INTRAVITREAL | Status: AC | PRN
  Administered 2023-10-21: 1.25 mg via INTRAVITREAL

## 2023-11-24 NOTE — Progress Notes (Signed)
 [facility] - Clinic Note  12/02/2023     CHIEF COMPLAINT Patient presents for Retina Follow Up  HISTORY OF PRESENT ILLNESS: Martha Richard is a 89 y.o. female who presents to the clinic today for:   HPI     Retina Follow Up   Patient presents with  CRVO/BRVO.  In left eye.  This started 6 weeks ago.  Duration of 6 weeks.  Since onset it is stable.  I, the attending physician,  performed the HPI with the patient and updated documentation appropriately.        Comments   6 week retina follow up BRVO OS and IVA OS pt is reporting no vision changes noticed she has appointment Monday at [facility] for ptosis eval       Last edited by Valdemar Rogue, MD on 12/02/2023  5:19 PM.     Pt states her vision is the same. She sees floaters.  Referring physician: Atilano Deward ORN, MD [address] [place],  [place] [address]  HISTORICAL INFORMATION:   Selected notes from the MEDICAL RECORD NUMBER Referred by Dr. KYM Ferrier for concern of cystic IRF OS    CURRENT MEDICATIONS: Current Outpatient Medications (Ophthalmic Drugs)  Medication Sig   hydroxypropyl methylcellulose / hypromellose (ISOPTO TEARS / GONIOVISC) 2.5 % ophthalmic solution Place 1 drop into both eyes 3 (three) times daily as needed for dry eyes.   No current facility-administered medications for this visit. (Ophthalmic Drugs)   Current Outpatient Medications (Other)  Medication Sig   hydrochlorothiazide  (HYDRODIURIL ) 12.5 MG tablet Take 12.5 mg by mouth daily as needed (salt intake).   Current Facility-Administered Medications (Other)  Medication Route   Bevacizumab  (AVASTIN ) SOLN 1.25 mg Intravitreal   Bevacizumab  (AVASTIN ) SOLN 1.25 mg Intravitreal   Bevacizumab  (AVASTIN ) SOLN 1.25 mg Intravitreal   Bevacizumab  (AVASTIN ) SOLN 1.25 mg Intravitreal   Bevacizumab  (AVASTIN ) SOLN 1.25 mg Intravitreal   Bevacizumab  (AVASTIN ) SOLN 1.25 mg Intravitreal   REVIEW OF SYSTEMS: ROS   Positive for:  Musculoskeletal, Cardiovascular, Eyes Negative for: Constitutional, Gastrointestinal, Neurological, Skin, Genitourinary, HENT, Endocrine, Respiratory, Psychiatric, Allergic/Imm, Heme/Lymph Last edited by Resa Delon ORN, COT on 12/02/2023 12:45 PM.       ALLERGIES Allergies  Allergen Reactions   Ace Inhibitors Cough   Caffeine Other (See Comments)    shoots my BP up   Nsaids Other (See Comments)    GI upset     Peanut-Containing Drug Products Other (See Comments)    headaches   Ultram [Tramadol Hcl] Other (See Comments)    Headaches. Patient is unsure of this.   Penicillins Itching and Rash    Has patient had a PCN reaction causing immediate rash, facial/tongue/throat swelling, SOB or lightheadedness with hypotension: No Has patient had a PCN reaction causing severe rash involving mucus membranes or skin necrosis: No Has patient had a PCN reaction that required hospitalization No Has patient had a PCN reaction occurring within the last 10 years: No If all of the above answers are NO, then may proceed with Cephalosporin use.  Tolerated Cephalosporin Date: 01/03/20.     PAST MEDICAL HISTORY Past Medical History:  Diagnosis Date   Aortic stenosis    Arrhythmia    palpitations   Arthritis    Cancer (HCC)    squamous cell Left hand   Complication of anesthesia    slow to wake up   Depression    Lost a son to cancer   Headache  hx of migraines   Hypertension    Hypertensive retinopathy    OU   Left bundle branch block    Past Surgical History:  Procedure Laterality Date   APPENDECTOMY     CATARACT EXTRACTION Bilateral    OU about 10 yrs ago   COLONOSCOPY WITH PROPOFOL  N/A 05/01/2020   Surgeon: Cindie Carlin POUR, DO;Nonbleeding internal hemorrhoids, pancolonic diverticulosis, 1 7 mm tubular adenoma resected, single nonbleeding colonic angioectasia treated with APC.  No recommendations to repeat colonoscopy due to age.   EYE SURGERY     laser    fibroadenoma left breast removal     HOT HEMOSTASIS  05/01/2020   Procedure: HOT HEMOSTASIS (ARGON PLASMA COAGULATION/BICAP);  Surgeon: Cindie Carlin POUR, DO;  Location: [facility];  Service: Endoscopy;;   OTHER SURGICAL HISTORY     appendectomy   POLYPECTOMY  05/01/2020   Procedure: POLYPECTOMY;  Surgeon: Cindie Carlin POUR, DO;  Location: [facility];  Service: Endoscopy;;   TOTAL HIP ARTHROPLASTY Left 06/18/2016   Procedure: LEFT TOTAL HIP ARTHROPLASTY ANTERIOR APPROACH;  Surgeon: Dempsey Moan, MD;  Location: [facility];  Service: Orthopedics;  Laterality: Left;   TOTAL HIP ARTHROPLASTY Right 05/05/2018   Procedure: TOTAL HIP ARTHROPLASTY ANTERIOR APPROACH;  Surgeon: Moan Dempsey, MD;  Location: [facility];  Service: Orthopedics;  Laterality: Right;   TOTAL KNEE ARTHROPLASTY Right 01/02/2020   Procedure: TOTAL KNEE ARTHROPLASTY;  Surgeon: Moan Dempsey, MD;  Location: [facility];  Service: Orthopedics;  Laterality: Right;    UTERINE FIBROID SURGERY     FAMILY HISTORY Family History  Problem Relation Age of Onset   Heart disease Mother    Heart attack Mother    Heart failure Mother    Stroke Father    Heart attack Maternal Aunt    Heart disease Maternal Aunt    Heart failure Maternal Aunt    Amblyopia Neg Hx    Blindness Neg Hx    Cataracts Neg Hx    Glaucoma Neg Hx    Macular degeneration Neg Hx    Retinal detachment Neg Hx    Strabismus Neg Hx    Retinitis pigmentosa Neg Hx    Colon cancer Neg Hx    SOCIAL HISTORY Social History   Tobacco Use   Smoking status: Former    Current packs/day: 0.00    Average packs/day: 0.3 packs/day for 3.0 years (0.8 ttl pk-yrs)    Types: Cigarettes    Start date: 04/27/1961    Quit date: 04/27/1964    Years since quitting: 59.6   Smokeless tobacco: Never   Tobacco comments:    years ago 50 plus years  Vaping Use   Vaping status: Never Used  Substance Use Topics   Alcohol  use: No   Drug use: No       OPHTHALMIC EXAM: Base Eye  Exam     Visual Acuity (Snellen - Linear)       Right Left   Dist cc 20/20 -1 20/50 -2   Dist ph cc  NI    Correction: Glasses         Tonometry (Tonopen, 12:49 PM)       Right Left   Pressure 18 18         Pupils       Pupils Dark Light Shape React APD   Right PERRL 2 1 Round Brisk None   Left PERRL 2 1 Round Brisk None         Visual  Fields       Left Right    Full Full         Extraocular Movement       Right Left    Full, Ortho Full, Ortho         Neuro/Psych     Oriented x3: Yes   Mood/Affect: Normal         Dilation     Both eyes: 2.5% Phenylephrine  @ 12:49 PM           Slit Lamp and Fundus Exam     Slit Lamp Exam       Right Left   Lids/Lashes Dermatochalasis - upper lid Dermatochalasis with hooding temporally   Conjunctiva/Sclera White and quiet White and quiet   Cornea Arcus, tear film debris, Well healed cataract wounds, nasal and temporal LRI, 1+ Punctate epithelial erosions Mild arcus, 1+Punctate epithelial erosions inferiorly, nasal and temporal LRI, mild tear film debris   Anterior Chamber deep and clear deep and clear   Iris Round and moderately dilated Round and moderately dilated   Lens PC IOL in good position with open PC PC IOL in good position, open PC   Anterior Vitreous mild syneresis Vitreous syneresis, Posterior vitreous detachment         Fundus Exam       Right Left   Disc +hyperemia at 0430, Pink and Sharp Tilted, +collateral vessels temporally, vascular loops, inferior rim thinning, mild pallor, Sharp rim, +cupping   C/D Ratio 0.5 0.8   Macula Good foveal reflex, Retinal pigment epithelial mottling, No heme or edema good foveal reflex; trace central cystic changes temporal fovea- slightly improved, no exudate   Vessels attenuated, Tortuous attenuated, Tortuous, mild AV crossing changes   Periphery Attached, No heme Attached, No heme           Refraction     Wearing Rx       Sphere Cylinder  Axis Add   Right +1.50 +1.50 014 +2.50   Left +2.00 +1.00 178 +2.50           IMAGING AND PROCEDURES  Imaging and Procedures for 05/25/17  OCT, Retina - OU - Both Eyes       Right Eye Quality was borderline. Central Foveal Thickness: 259. Progression has been stable. Findings include normal foveal contour, no IRF, no SRF, epiretinal membrane (stable improvement in IRF ).   Left Eye Quality was good. Central Foveal Thickness: 243. Progression has improved. Findings include normal foveal contour, no IRF, no SRF, intraretinal hyper-reflective material, epiretinal membrane (Trace focal IRF/cystic changes temporal fovea -- slightly improved).   Notes *Images captured and stored on drive  Diagnosis / Impression:  OD: stable improvement in IRF  OS: BRVO w/ CME OS -- Trace focal IRF/cystic changes temporal fovea -- slightly improved  Clinical management:  See below  Abbreviations: NFP - Normal foveal profile. CME - cystoid macular edema. PED - pigment epithelial detachment. IRF - intraretinal fluid. SRF - subretinal fluid. EZ - ellipsoid zone. ERM - epiretinal membrane. ORA - outer retinal atrophy. ORT - outer retinal tubulation. SRHM - subretinal hyper-reflective material       Intravitreal Injection, Pharmacologic Agent - OS - Left Eye       Time Out 12/02/2023. 1:14 PM. Confirmed correct patient, procedure, site, and patient consented.   Anesthesia Topical anesthesia was used. Anesthetic medications included Lidocaine  2%, Proparacaine 0.5%.   Procedure Preparation included 5% betadine  to ocular surface, eyelid speculum. A (32g) needle was  used.   Injection: 1.25 mg Bevacizumab  1.25mg /0.39ml   Route: Intravitreal, Site: Left Eye   NDC: [id number], Lot: [id number], Expiration date: 12/13/2023   Post-op Post injection exam found visual acuity of at least counting fingers. The patient tolerated the procedure well. There were no complications. The patient received written  and verbal post procedure care education. Post injection medications were not given.            ASSESSMENT/PLAN:    ICD-10-CM   1. Branch retinal vein occlusion of left eye with macular edema (HCC)  H34.8320 OCT, Retina - OU - Both Eyes    Intravitreal Injection, Pharmacologic Agent - OS - Left Eye    Bevacizumab  (AVASTIN ) SOLN 1.25 mg    2. Essential hypertension  I10     3. Hypertensive retinopathy of both eyes  H35.033     4. Posterior vitreous detachment of left eye  H43.812     5. 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 Pseudophakia of both eyes  Z96.1     7. PCO (posterior capsular opacification), left  H26.492     8. Dermatochalasis of both upper eyelids  H02.831    H02.834      1. BRVO w/ macular edema OS - delayed follow up from 11-12 weeks to 6+ months (Nov. 2023 to June 2024) - delayed follow up from 4-5 weeks to 22 months (from Jan 2021 to Nov 2022) - on presenting exam, superior temporal venule was dilated and tortuous but no significant intraretinal hemorrhages suggesting perhaps a remote BRVO event had occurred - FA at presentation (01.23.19) shows significant leakage from branches from the superior temporal venules corresponding to central cystic changes noted on OCT - S/P IVA OS #1 (01.23.19), #2 (02.25.19), #3 (04.01.19), #4 (05.07.19), #5 (06.25.19), #6 (02.21.20), #7 (09.16.20), #8 (10.21.20), #9 (12.08.20), #10 (01.22.21), #11 (11.21.22), #12 (01.23.23), #13 (03.27.23), #14 (06.12.23), #15 (09.11.23), #16 (11.28.23), #17 (08.27.25) - lost to follow up from February to Sept 2020 and from January 2021 to November 2022 **history of increased IRF/cystic changes at 13 wks, noted on 09.11.23 visit** - OCT today shows Trace focal IRF/cystic changes temporal fovea - slightly improved at 6 weeks  - BCVA OS 20/50 from 20/40 - recommend IVA OS # 18 today, 10.08.25 with follow up in 6 weeks  - pt wishes to proceed with injection OS - RBA of procedure discussed,  questions answered   - Avastin  consent obtained, signed, and scanned 08.27.25 - see procedure note  - F/U 6 weeks, sooner prn -- DFE/OCT/possible injection  2,3. Hypertensive retinopathy OU  - discussed importance of tight BP control  - monitor  4. PVD OS  - Discussed findings and prognosis  - No RT or RD on 360 peripheral exam  - Reviewed s/s of RT/RD  - strict return precautions for any such RT/RD symptoms  5. Glaucoma Suspect OS  - IOP 18  6,7. Pseudophakia OU  - s/p CE/IOL OU [facility] yrs ago at [facility]  - s/p YAG capsulotomy OU  - OS re-YAG'd by Dr. Lelon -- PC opening improved  - monitor  8. Dermatochalasis OU  - pt reports drooping upper lids are impeding her vision (OS > OD)  - pt has apt 10.13.25 at [facility]  Ophthalmic Meds Ordered this visit:  Meds ordered this encounter  Medications   Bevacizumab  (AVASTIN ) SOLN 1.25 mg     Return in about 6 weeks (around 01/13/2024) for f/u, BRVO, DFE,  OCT, Possible, IVA, OS.  There are no Patient Instructions on file for this visit.   Explained the diagnoses, plan, and follow up with the patient and they expressed understanding.  Patient expressed understanding of the importance of proper follow up care.   This document serves as a record of services personally performed by Redell JUDITHANN Hans, MD, PhD. It was created on their behalf by Almetta Pesa, an ophthalmic technician. The creation of this record is the provider's dictation and/or activities during the visit.    Electronically signed by: Almetta Pesa, OA, 12/06/23  3:36 PM  This document serves as a record of services personally performed by Redell JUDITHANN Hans, MD, PhD. It was created on their behalf by Wanda GEANNIE Keens, COT an ophthalmic technician. The creation of this record is the provider's dictation and/or activities during the visit.    Electronically signed by:  Wanda GEANNIE Keens, COT  12/06/23 3:36 PM  Redell JUDITHANN Hans, M.D.,  Ph.D. Diseases & Surgery of the Retina and Vitreous [facility]  I have reviewed the above documentation for accuracy and completeness, and I agree with the above. Redell JUDITHANN Hans, M.D., Ph.D. 12/06/23 3:38 PM    Abbreviations: M myopia (nearsighted); A astigmatism; H hyperopia (farsighted); P presbyopia; Mrx spectacle prescription;  CTL contact lenses; OD right eye; OS left eye; OU both eyes  XT exotropia; ET esotropia; PEK punctate epithelial keratitis; PEE punctate epithelial erosions; DES dry eye syndrome; MGD meibomian gland dysfunction; ATs artificial tears; PFAT's preservative free artificial tears; NSC nuclear sclerotic cataract; PSC posterior subcapsular cataract; ERM epi-retinal membrane; PVD posterior vitreous detachment; RD retinal detachment; DM diabetes mellitus; DR diabetic retinopathy; NPDR non-proliferative diabetic retinopathy; PDR proliferative diabetic retinopathy; CSME clinically significant macular edema; DME diabetic macular edema; dbh dot blot hemorrhages; CWS cotton wool spot; POAG primary open angle glaucoma; C/D cup-to-disc ratio; HVF humphrey visual field; GVF goldmann visual field; OCT optical coherence tomography; IOP intraocular pressure; BRVO Branch retinal vein occlusion; CRVO central retinal vein occlusion; CRAO central retinal artery occlusion; BRAO branch retinal artery occlusion; RT retinal tear; SB scleral buckle; PPV pars plana vitrectomy; VH Vitreous hemorrhage; PRP panretinal laser photocoagulation; IVK intravitreal kenalog; VMT vitreomacular traction; MH Macular hole;  NVD neovascularization of the disc; NVE neovascularization elsewhere; AREDS age related eye disease study; ARMD age related macular degeneration; POAG primary open angle glaucoma; EBMD epithelial/anterior basement membrane dystrophy; ACIOL anterior chamber intraocular lens; IOL intraocular lens; PCIOL posterior chamber intraocular lens; Phaco/IOL phacoemulsification with  intraocular lens placement; PRK photorefractive keratectomy; LASIK laser assisted in situ keratomileusis; HTN hypertension; DM diabetes mellitus; COPD chronic obstructive pulmonary disease

## 2023-12-02 ENCOUNTER — Ambulatory Visit (INDEPENDENT_AMBULATORY_CARE_PROVIDER_SITE_OTHER): Admitting: Ophthalmology

## 2023-12-02 ENCOUNTER — Encounter (INDEPENDENT_AMBULATORY_CARE_PROVIDER_SITE_OTHER): Payer: Self-pay | Admitting: Ophthalmology

## 2023-12-02 DIAGNOSIS — H34832 Tributary (branch) retinal vein occlusion, left eye, with macular edema: Secondary | ICD-10-CM

## 2023-12-02 DIAGNOSIS — Z961 Presence of intraocular lens: Secondary | ICD-10-CM | POA: Diagnosis not present

## 2023-12-02 DIAGNOSIS — H26492 Other secondary cataract, left eye: Secondary | ICD-10-CM | POA: Diagnosis not present

## 2023-12-02 DIAGNOSIS — H02831 Dermatochalasis of right upper eyelid: Secondary | ICD-10-CM

## 2023-12-02 DIAGNOSIS — H35033 Hypertensive retinopathy, bilateral: Secondary | ICD-10-CM

## 2023-12-02 DIAGNOSIS — H40002 Preglaucoma, unspecified, left eye: Secondary | ICD-10-CM

## 2023-12-02 DIAGNOSIS — I10 Essential (primary) hypertension: Secondary | ICD-10-CM

## 2023-12-02 DIAGNOSIS — H02834 Dermatochalasis of left upper eyelid: Secondary | ICD-10-CM | POA: Diagnosis not present

## 2023-12-02 DIAGNOSIS — H43812 Vitreous degeneration, left eye: Secondary | ICD-10-CM | POA: Diagnosis not present

## 2023-12-02 MED ORDER — BEVACIZUMAB CHEMO INJECTION 1.25MG/0.05ML SYRINGE FOR KALEIDOSCOPE
1.2500 mg | INTRAVITREAL | Status: AC | PRN
  Administered 2023-12-02: 1.25 mg via INTRAVITREAL

## 2023-12-07 DIAGNOSIS — H02834 Dermatochalasis of left upper eyelid: Secondary | ICD-10-CM | POA: Diagnosis not present

## 2023-12-07 DIAGNOSIS — H02423 Myogenic ptosis of bilateral eyelids: Secondary | ICD-10-CM | POA: Diagnosis not present

## 2023-12-07 DIAGNOSIS — H02411 Mechanical ptosis of right eyelid: Secondary | ICD-10-CM | POA: Diagnosis not present

## 2023-12-07 DIAGNOSIS — H53483 Generalized contraction of visual field, bilateral: Secondary | ICD-10-CM | POA: Diagnosis not present

## 2023-12-07 DIAGNOSIS — H02413 Mechanical ptosis of bilateral eyelids: Secondary | ICD-10-CM | POA: Diagnosis not present

## 2023-12-07 DIAGNOSIS — H02831 Dermatochalasis of right upper eyelid: Secondary | ICD-10-CM | POA: Diagnosis not present

## 2023-12-07 DIAGNOSIS — H0279 Other degenerative disorders of eyelid and periocular area: Secondary | ICD-10-CM | POA: Diagnosis not present

## 2023-12-07 DIAGNOSIS — H02422 Myogenic ptosis of left eyelid: Secondary | ICD-10-CM | POA: Diagnosis not present

## 2023-12-07 DIAGNOSIS — Z01818 Encounter for other preprocedural examination: Secondary | ICD-10-CM | POA: Diagnosis not present

## 2023-12-07 DIAGNOSIS — H02412 Mechanical ptosis of left eyelid: Secondary | ICD-10-CM | POA: Diagnosis not present

## 2023-12-07 DIAGNOSIS — H57813 Brow ptosis, bilateral: Secondary | ICD-10-CM | POA: Diagnosis not present

## 2023-12-07 DIAGNOSIS — H02421 Myogenic ptosis of right eyelid: Secondary | ICD-10-CM | POA: Diagnosis not present

## 2023-12-21 DIAGNOSIS — H53483 Generalized contraction of visual field, bilateral: Secondary | ICD-10-CM | POA: Diagnosis not present

## 2024-01-04 NOTE — Progress Notes (Shared)
 [facility] - Clinic Note  01/18/2024     CHIEF COMPLAINT Patient presents for No chief complaint on file.  HISTORY OF PRESENT ILLNESS: Martha Richard is a 89 y.o. female who presents to the clinic today for:     Pt states her vision is the same. She sees floaters.  Referring physician: Atilano Deward ORN, MD [address] [place],  [place] [address]  HISTORICAL INFORMATION:   Selected notes from the MEDICAL RECORD NUMBER Referred by Dr. KYM Ferrier for concern of cystic IRF OS    CURRENT MEDICATIONS: Current Outpatient Medications (Ophthalmic Drugs)  Medication Sig   hydroxypropyl methylcellulose / hypromellose (ISOPTO TEARS / GONIOVISC) 2.5 % ophthalmic solution Place 1 drop into both eyes 3 (three) times daily as needed for dry eyes.   No current facility-administered medications for this visit. (Ophthalmic Drugs)   Current Outpatient Medications (Other)  Medication Sig   hydrochlorothiazide  (HYDRODIURIL ) 12.5 MG tablet Take 12.5 mg by mouth daily as needed (salt intake).   Current Facility-Administered Medications (Other)  Medication Route   Bevacizumab  (AVASTIN ) SOLN 1.25 mg Intravitreal   Bevacizumab  (AVASTIN ) SOLN 1.25 mg Intravitreal   Bevacizumab  (AVASTIN ) SOLN 1.25 mg Intravitreal   Bevacizumab  (AVASTIN ) SOLN 1.25 mg Intravitreal   Bevacizumab  (AVASTIN ) SOLN 1.25 mg Intravitreal   Bevacizumab  (AVASTIN ) SOLN 1.25 mg Intravitreal   REVIEW OF SYSTEMS:     ALLERGIES Allergies  Allergen Reactions   Ace Inhibitors Cough   Caffeine Other (See Comments)    shoots my BP up   Nsaids Other (See Comments)    GI upset     Peanut-Containing Drug Products Other (See Comments)    headaches   Ultram [Tramadol Hcl] Other (See Comments)    Headaches. Patient is unsure of this.   Penicillins Itching and Rash    Has patient had a PCN reaction causing immediate rash, facial/tongue/throat swelling, SOB or lightheadedness with hypotension: No Has  patient had a PCN reaction causing severe rash involving mucus membranes or skin necrosis: No Has patient had a PCN reaction that required hospitalization No Has patient had a PCN reaction occurring within the last 10 years: No If all of the above answers are NO, then may proceed with Cephalosporin use.  Tolerated Cephalosporin Date: 01/03/20.     PAST MEDICAL HISTORY Past Medical History:  Diagnosis Date   Aortic stenosis    Arrhythmia    palpitations   Arthritis    Cancer (HCC)    squamous cell Left hand   Complication of anesthesia    slow to wake up   Depression    Lost a son to cancer   Headache    hx of migraines   Hypertension    Hypertensive retinopathy    OU   Left bundle branch block    Past Surgical History:  Procedure Laterality Date   APPENDECTOMY     CATARACT EXTRACTION Bilateral    OU about 10 yrs ago   COLONOSCOPY WITH PROPOFOL  N/A 05/01/2020   Surgeon: Cindie Carlin POUR, DO;Nonbleeding internal hemorrhoids, pancolonic diverticulosis, 1 7 mm tubular adenoma resected, single nonbleeding colonic angioectasia treated with APC.  No recommendations to repeat colonoscopy due to age.   EYE SURGERY     laser   fibroadenoma left breast removal     HOT HEMOSTASIS  05/01/2020   Procedure: HOT HEMOSTASIS (ARGON PLASMA COAGULATION/BICAP);  Surgeon: Cindie Carlin POUR, DO;  Location: [facility];  Service: Endoscopy;;  OTHER SURGICAL HISTORY     appendectomy   POLYPECTOMY  05/01/2020   Procedure: POLYPECTOMY;  Surgeon: Cindie Carlin POUR, DO;  Location: [facility];  Service: Endoscopy;;   TOTAL HIP ARTHROPLASTY Left 06/18/2016   Procedure: LEFT TOTAL HIP ARTHROPLASTY ANTERIOR APPROACH;  Surgeon: Dempsey Moan, MD;  Location: [facility];  Service: Orthopedics;  Laterality: Left;   TOTAL HIP ARTHROPLASTY Right 05/05/2018   Procedure: TOTAL HIP ARTHROPLASTY ANTERIOR APPROACH;  Surgeon: Moan Dempsey, MD;  Location: [facility];  Service: Orthopedics;  Laterality: Right;    TOTAL KNEE ARTHROPLASTY Right 01/02/2020   Procedure: TOTAL KNEE ARTHROPLASTY;  Surgeon: Moan Dempsey, MD;  Location: [facility];  Service: Orthopedics;  Laterality: Right;    UTERINE FIBROID SURGERY     FAMILY HISTORY Family History  Problem Relation Age of Onset   Heart disease Mother    Heart attack Mother    Heart failure Mother    Stroke Father    Heart attack Maternal Aunt    Heart disease Maternal Aunt    Heart failure Maternal Aunt    Amblyopia Neg Hx    Blindness Neg Hx    Cataracts Neg Hx    Glaucoma Neg Hx    Macular degeneration Neg Hx    Retinal detachment Neg Hx    Strabismus Neg Hx    Retinitis pigmentosa Neg Hx    Colon cancer Neg Hx    SOCIAL HISTORY Social History   Tobacco Use   Smoking status: Former    Current packs/day: 0.00    Average packs/day: 0.3 packs/day for 3.0 years (0.8 ttl pk-yrs)    Types: Cigarettes    Start date: 04/27/1961    Quit date: 04/27/1964    Years since quitting: 59.7   Smokeless tobacco: Never   Tobacco comments:    years ago 50 plus years  Vaping Use   Vaping status: Never Used  Substance Use Topics   Alcohol  use: No   Drug use: No       OPHTHALMIC EXAM: Not recorded    IMAGING AND PROCEDURES  Imaging and Procedures for 05/25/17          ASSESSMENT/PLAN:    ICD-10-CM   1. Branch retinal vein occlusion of left eye with macular edema (HCC)  Y65.1679     2. Essential hypertension  I10     3. Hypertensive retinopathy of both eyes  H35.033     4. Posterior vitreous detachment of left eye  H43.812     5. 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 Pseudophakia of both eyes  Z96.1     7. PCO (posterior capsular opacification), left  H26.492     8. Dermatochalasis of both upper eyelids  H02.831    H02.834       1. BRVO w/ macular edema OS - delayed follow up from 11-12 weeks to 6+ months (Nov. 2023 to June 2024) - delayed follow up from 4-5 weeks to 22 months (from Jan 2021 to Nov 2022) - on  presenting exam, superior temporal venule was dilated and tortuous but no significant intraretinal hemorrhages suggesting perhaps a remote BRVO event had occurred - FA at presentation (01.23.19) shows significant leakage from branches from the superior temporal venules corresponding to central cystic changes noted on OCT - S/P IVA OS #1 (01.23.19), #2 (02.25.19), #3 (04.01.19), #4 (05.07.19), #5 (06.25.19), #6 (02.21.20), #7 (09.16.20), #8 (10.21.20), #9 (12.08.20), #10 (01.22.21), #11 (11.21.22), #12 (01.23.23), #13 (03.27.23), #  14 (06.12.23), #15 (09.11.23), #16 (11.28.23), #17 (08.27.25), #18 (10.08.25) - lost to follow up from February to Sept 2020 and from January 2021 to November 2022 **history of increased IRF/cystic changes at 13 wks, noted on 09.11.23 visit** - OCT today shows Trace focal IRF/cystic changes temporal fovea - slightly improved at 6 weeks  - BCVA OS 20/50 from 20/40 - recommend IVA today OS #19 (11.24.25) with follow up in 6 weeks  - pt wishes to proceed with injection OS - RBA of procedure discussed, questions answered   - Avastin  consent obtained, signed, and scanned 08.27.25 - see procedure note  - F/U 6 weeks, sooner prn -- DFE/OCT/possible injection  2,3. Hypertensive retinopathy OU  - discussed importance of tight BP control  - monitor  4. PVD OS  - Discussed findings and prognosis  - No RT or RD on 360 peripheral exam  - Reviewed s/s of RT/RD  - strict return precautions for any such RT/RD symptoms  5. Glaucoma Suspect OS  - IOP 18  6,7. Pseudophakia OU  - s/p CE/IOL OU 11 yrs ago at [facility]  - s/p YAG capsulotomy OU  - OS re-YAG'd by Dr. Lelon -- PC opening improved  - monitor  8. Dermatochalasis OU  - pt reports drooping upper lids are impeding her vision (OS > OD)  - pt has apt 10.13.25 at [facility]  Ophthalmic Meds Ordered this visit:  No orders of the defined types were placed in this encounter.    No follow-ups on  file.  There are no Patient Instructions on file for this visit.   Explained the diagnoses, plan, and follow up with the patient and they expressed understanding.  Patient expressed understanding of the importance of proper follow up care.   This document serves as a record of services personally performed by Redell JUDITHANN Hans, MD, PhD. It was created on their behalf by Avelina Pereyra, COA an ophthalmic technician. The creation of this record is the provider's dictation and/or activities during the visit.   Electronically signed by: Avelina GORMAN Pereyra, COT  01/04/24  3:20 PM    Redell JUDITHANN Hans, M.D., Ph.D. Diseases & Surgery of the Retina and Vitreous [facility]     Abbreviations: M myopia (nearsighted); A astigmatism; H hyperopia (farsighted); P presbyopia; Mrx spectacle prescription;  CTL contact lenses; OD right eye; OS left eye; OU both eyes  XT exotropia; ET esotropia; PEK punctate epithelial keratitis; PEE punctate epithelial erosions; DES dry eye syndrome; MGD meibomian gland dysfunction; ATs artificial tears; PFAT's preservative free artificial tears; NSC nuclear sclerotic cataract; PSC posterior subcapsular cataract; ERM epi-retinal membrane; PVD posterior vitreous detachment; RD retinal detachment; DM diabetes mellitus; DR diabetic retinopathy; NPDR non-proliferative diabetic retinopathy; PDR proliferative diabetic retinopathy; CSME clinically significant macular edema; DME diabetic macular edema; dbh dot blot hemorrhages; CWS cotton wool spot; POAG primary open angle glaucoma; C/D cup-to-disc ratio; HVF humphrey visual field; GVF goldmann visual field; OCT optical coherence tomography; IOP intraocular pressure; BRVO Branch retinal vein occlusion; CRVO central retinal vein occlusion; CRAO central retinal artery occlusion; BRAO branch retinal artery occlusion; RT retinal tear; SB scleral buckle; PPV pars plana vitrectomy; VH Vitreous hemorrhage; PRP panretinal laser  photocoagulation; IVK intravitreal kenalog; VMT vitreomacular traction; MH Macular hole;  NVD neovascularization of the disc; NVE neovascularization elsewhere; AREDS age related eye disease study; ARMD age related macular degeneration; POAG primary open angle glaucoma; EBMD epithelial/anterior basement membrane dystrophy; ACIOL anterior chamber intraocular lens; IOL intraocular  lens; PCIOL posterior chamber intraocular lens; Phaco/IOL phacoemulsification with intraocular lens placement; PRK photorefractive keratectomy; LASIK laser assisted in situ keratomileusis; HTN hypertension; DM diabetes mellitus; COPD chronic obstructive pulmonary disease

## 2024-01-18 ENCOUNTER — Encounter (INDEPENDENT_AMBULATORY_CARE_PROVIDER_SITE_OTHER): Admitting: Ophthalmology

## 2024-01-18 ENCOUNTER — Encounter (INDEPENDENT_AMBULATORY_CARE_PROVIDER_SITE_OTHER): Payer: Self-pay

## 2024-01-18 DIAGNOSIS — H26492 Other secondary cataract, left eye: Secondary | ICD-10-CM

## 2024-01-18 DIAGNOSIS — Z961 Presence of intraocular lens: Secondary | ICD-10-CM

## 2024-01-18 DIAGNOSIS — H02831 Dermatochalasis of right upper eyelid: Secondary | ICD-10-CM

## 2024-01-18 DIAGNOSIS — I10 Essential (primary) hypertension: Secondary | ICD-10-CM

## 2024-01-18 DIAGNOSIS — H40002 Preglaucoma, unspecified, left eye: Secondary | ICD-10-CM

## 2024-01-18 DIAGNOSIS — H43812 Vitreous degeneration, left eye: Secondary | ICD-10-CM

## 2024-01-18 DIAGNOSIS — H35033 Hypertensive retinopathy, bilateral: Secondary | ICD-10-CM

## 2024-01-18 DIAGNOSIS — H34832 Tributary (branch) retinal vein occlusion, left eye, with macular edema: Secondary | ICD-10-CM

## 2024-02-19 ENCOUNTER — Ambulatory Visit: Admitting: Cardiology
